# Patient Record
Sex: Female | Born: 1940 | Race: White | Hispanic: No | Smoking: Never smoker
Health system: Southern US, Community
[De-identification: ages and names within clinical notes are randomized; demographics above are authoritative.]

## PROBLEM LIST (undated history)

## (undated) DIAGNOSIS — I482 Chronic atrial fibrillation, unspecified: Secondary | ICD-10-CM

## (undated) DIAGNOSIS — E7800 Pure hypercholesterolemia, unspecified: Secondary | ICD-10-CM

## (undated) DIAGNOSIS — J189 Pneumonia, unspecified organism: Secondary | ICD-10-CM

## (undated) DIAGNOSIS — J42 Unspecified chronic bronchitis: Secondary | ICD-10-CM

## (undated) DIAGNOSIS — E785 Hyperlipidemia, unspecified: Secondary | ICD-10-CM

## (undated) DIAGNOSIS — E119 Type 2 diabetes mellitus without complications: Secondary | ICD-10-CM

## (undated) DIAGNOSIS — R000 Tachycardia, unspecified: Secondary | ICD-10-CM

## (undated) HISTORY — PX: BACK SURGERY: SHX140

## (undated) HISTORY — PX: ECTOPIC PREGNANCY SURGERY: SHX613

---

## 2016-06-13 ENCOUNTER — Telehealth: Payer: Self-pay | Admitting: Internal Medicine

## 2016-06-13 NOTE — Telephone Encounter (Signed)
Patients daughter Kym Groom) called and wanted to see if Dr. Alain Marion would take her Mother on as a patient. The patient speaks russian. She needs a doctor that can understand her. She has never been in the [facility]. She is in the hospital now in [place]. She always has a green card her daughter states and no insurance. They are aware she will have to pay $80 for her appointment up front. Please advise if you will take her on as a patient. Thank you.

## 2016-09-27 ENCOUNTER — Inpatient Hospital Stay (HOSPITAL_BASED_OUTPATIENT_CLINIC_OR_DEPARTMENT_OTHER)
Admission: EM | Admit: 2016-09-27 | Discharge: 2016-10-16 | DRG: 853 | Disposition: A | Discharge status: Home / Self Care | Payer: Medicaid Other | Attending: Internal Medicine | Admitting: Internal Medicine

## 2016-09-27 ENCOUNTER — Encounter (HOSPITAL_BASED_OUTPATIENT_CLINIC_OR_DEPARTMENT_OTHER): Payer: Self-pay

## 2016-09-27 ENCOUNTER — Emergency Department (HOSPITAL_BASED_OUTPATIENT_CLINIC_OR_DEPARTMENT_OTHER): Payer: Medicaid Other

## 2016-09-27 DIAGNOSIS — C3490 Malignant neoplasm of unspecified part of unspecified bronchus or lung: Secondary | ICD-10-CM | POA: Diagnosis present

## 2016-09-27 DIAGNOSIS — T461X5A Adverse effect of calcium-channel blockers, initial encounter: Secondary | ICD-10-CM | POA: Diagnosis not present

## 2016-09-27 DIAGNOSIS — J9601 Acute respiratory failure with hypoxia: Secondary | ICD-10-CM | POA: Diagnosis present

## 2016-09-27 DIAGNOSIS — I952 Hypotension due to drugs: Secondary | ICD-10-CM | POA: Diagnosis not present

## 2016-09-27 DIAGNOSIS — R042 Hemoptysis: Secondary | ICD-10-CM | POA: Diagnosis present

## 2016-09-27 DIAGNOSIS — R0602 Shortness of breath: Secondary | ICD-10-CM

## 2016-09-27 DIAGNOSIS — Q211 Atrial septal defect: Secondary | ICD-10-CM

## 2016-09-27 DIAGNOSIS — I2609 Other pulmonary embolism with acute cor pulmonale: Secondary | ICD-10-CM | POA: Diagnosis present

## 2016-09-27 DIAGNOSIS — J9602 Acute respiratory failure with hypercapnia: Secondary | ICD-10-CM | POA: Diagnosis not present

## 2016-09-27 DIAGNOSIS — R918 Other nonspecific abnormal finding of lung field: Secondary | ICD-10-CM

## 2016-09-27 DIAGNOSIS — R059 Cough, unspecified: Secondary | ICD-10-CM

## 2016-09-27 DIAGNOSIS — Z8249 Family history of ischemic heart disease and other diseases of the circulatory system: Secondary | ICD-10-CM | POA: Diagnosis not present

## 2016-09-27 DIAGNOSIS — T501X5A Adverse effect of loop [high-ceiling] diuretics, initial encounter: Secondary | ICD-10-CM | POA: Diagnosis not present

## 2016-09-27 DIAGNOSIS — K5900 Constipation, unspecified: Secondary | ICD-10-CM | POA: Diagnosis present

## 2016-09-27 DIAGNOSIS — E875 Hyperkalemia: Secondary | ICD-10-CM | POA: Diagnosis not present

## 2016-09-27 DIAGNOSIS — Z7901 Long term (current) use of anticoagulants: Secondary | ICD-10-CM

## 2016-09-27 DIAGNOSIS — I272 Pulmonary hypertension, unspecified: Secondary | ICD-10-CM | POA: Diagnosis present

## 2016-09-27 DIAGNOSIS — E785 Hyperlipidemia, unspecified: Secondary | ICD-10-CM | POA: Diagnosis present

## 2016-09-27 DIAGNOSIS — Z8701 Personal history of pneumonia (recurrent): Secondary | ICD-10-CM

## 2016-09-27 DIAGNOSIS — E8809 Other disorders of plasma-protein metabolism, not elsewhere classified: Secondary | ICD-10-CM | POA: Diagnosis present

## 2016-09-27 DIAGNOSIS — J90 Pleural effusion, not elsewhere classified: Secondary | ICD-10-CM | POA: Diagnosis present

## 2016-09-27 DIAGNOSIS — D649 Anemia, unspecified: Secondary | ICD-10-CM | POA: Diagnosis present

## 2016-09-27 DIAGNOSIS — R9431 Abnormal electrocardiogram [ECG] [EKG]: Secondary | ICD-10-CM | POA: Diagnosis present

## 2016-09-27 DIAGNOSIS — J181 Lobar pneumonia, unspecified organism: Secondary | ICD-10-CM | POA: Diagnosis present

## 2016-09-27 DIAGNOSIS — Z7951 Long term (current) use of inhaled steroids: Secondary | ICD-10-CM

## 2016-09-27 DIAGNOSIS — Z79899 Other long term (current) drug therapy: Secondary | ICD-10-CM

## 2016-09-27 DIAGNOSIS — J690 Pneumonitis due to inhalation of food and vomit: Secondary | ICD-10-CM

## 2016-09-27 DIAGNOSIS — I471 Supraventricular tachycardia: Secondary | ICD-10-CM | POA: Diagnosis present

## 2016-09-27 DIAGNOSIS — R590 Localized enlarged lymph nodes: Secondary | ICD-10-CM | POA: Diagnosis present

## 2016-09-27 DIAGNOSIS — J8410 Pulmonary fibrosis, unspecified: Secondary | ICD-10-CM | POA: Diagnosis present

## 2016-09-27 DIAGNOSIS — Z792 Long term (current) use of antibiotics: Secondary | ICD-10-CM

## 2016-09-27 DIAGNOSIS — E876 Hypokalemia: Secondary | ICD-10-CM | POA: Diagnosis present

## 2016-09-27 DIAGNOSIS — R000 Tachycardia, unspecified: Secondary | ICD-10-CM | POA: Diagnosis present

## 2016-09-27 DIAGNOSIS — I959 Hypotension, unspecified: Secondary | ICD-10-CM | POA: Diagnosis not present

## 2016-09-27 DIAGNOSIS — Z9889 Other specified postprocedural states: Secondary | ICD-10-CM

## 2016-09-27 DIAGNOSIS — J9811 Atelectasis: Secondary | ICD-10-CM | POA: Diagnosis present

## 2016-09-27 DIAGNOSIS — Y95 Nosocomial condition: Secondary | ICD-10-CM | POA: Diagnosis present

## 2016-09-27 DIAGNOSIS — J189 Pneumonia, unspecified organism: Secondary | ICD-10-CM | POA: Diagnosis present

## 2016-09-27 DIAGNOSIS — R1310 Dysphagia, unspecified: Secondary | ICD-10-CM | POA: Diagnosis present

## 2016-09-27 DIAGNOSIS — J9600 Acute respiratory failure, unspecified whether with hypoxia or hypercapnia: Secondary | ICD-10-CM

## 2016-09-27 DIAGNOSIS — R0600 Dyspnea, unspecified: Secondary | ICD-10-CM

## 2016-09-27 DIAGNOSIS — R05 Cough: Secondary | ICD-10-CM

## 2016-09-27 DIAGNOSIS — A419 Sepsis, unspecified organism: Secondary | ICD-10-CM | POA: Diagnosis present

## 2016-09-27 DIAGNOSIS — Z7982 Long term (current) use of aspirin: Secondary | ICD-10-CM

## 2016-09-27 HISTORY — DX: Pneumonia, unspecified organism: J18.9

## 2016-09-27 HISTORY — DX: Hyperlipidemia, unspecified: E78.5

## 2016-09-27 HISTORY — DX: Tachycardia, unspecified: R00.0

## 2016-09-27 LAB — CBC WITH DIFFERENTIAL/PLATELET
BASOS ABS: 0 10*3/uL (ref 0.0–0.1)
Basophils Relative: 0 %
Eosinophils Absolute: 0.2 10*3/uL (ref 0.0–0.7)
Eosinophils Relative: 2 %
HEMATOCRIT: 39.6 % (ref 36.0–46.0)
Hemoglobin: 13.3 g/dL (ref 12.0–15.0)
LYMPHS ABS: 3.2 10*3/uL (ref 0.7–4.0)
Lymphocytes Relative: 30 %
MCH: 27.9 pg (ref 26.0–34.0)
MCHC: 33.6 g/dL (ref 30.0–36.0)
MCV: 83 fL (ref 78.0–100.0)
MONO ABS: 1.1 10*3/uL — AB (ref 0.1–1.0)
Monocytes Relative: 10 %
NEUTROS ABS: 6 10*3/uL (ref 1.7–7.7)
Neutrophils Relative %: 58 %
Platelets: 327 10*3/uL (ref 150–400)
RBC: 4.77 MIL/uL (ref 3.87–5.11)
RDW: 14 % (ref 11.5–15.5)
WBC: 10.4 10*3/uL (ref 4.0–10.5)

## 2016-09-27 LAB — BASIC METABOLIC PANEL
ANION GAP: 7 (ref 5–15)
BUN: 23 mg/dL — AB (ref 6–20)
CO2: 29 mmol/L (ref 22–32)
Calcium: 8.8 mg/dL — ABNORMAL LOW (ref 8.9–10.3)
Chloride: 98 mmol/L — ABNORMAL LOW (ref 101–111)
Creatinine, Ser: 0.92 mg/dL (ref 0.44–1.00)
GFR calc Af Amer: >60 mL/min (ref >60)
GFR calc non Af Amer: 59 mL/min — ABNORMAL LOW (ref >60)
GLUCOSE: 129 mg/dL — AB (ref 65–99)
POTASSIUM: 3.9 mmol/L (ref 3.5–5.1)
Sodium: 134 mmol/L — ABNORMAL LOW (ref 135–145)

## 2016-09-27 LAB — TROPONIN I: Troponin I: <0.03 ng/mL (ref <0.03)

## 2016-09-27 LAB — BRAIN NATRIURETIC PEPTIDE: B NATRIURETIC PEPTIDE 5: 265.9 pg/mL — AB (ref 0.0–100.0)

## 2016-09-27 LAB — PROTIME-INR
INR: 0.92
PROTHROMBIN TIME: 12.4 s (ref 11.4–15.2)

## 2016-09-27 LAB — I-STAT CG4 LACTIC ACID, ED: Lactic Acid, Venous: 1.72 mmol/L (ref 0.5–1.9)

## 2016-09-27 MED ORDER — ALBUTEROL SULFATE (2.5 MG/3ML) 0.083% IN NEBU
5.0000 mg | INHALATION_SOLUTION | Freq: Once | RESPIRATORY_TRACT | Status: AC
Start: 2016-09-27 — End: 2016-09-27
  Administered 2016-09-27: 5 mg via RESPIRATORY_TRACT
  Filled 2016-09-27: qty 6

## 2016-09-27 MED ORDER — CEFEPIME HCL 1 G IJ SOLR
INTRAMUSCULAR | Status: AC
  Filled 2016-09-27: qty 1

## 2016-09-27 MED ORDER — SODIUM CHLORIDE 0.9 % IV BOLUS (SEPSIS)
1000.0000 mL | Freq: Once | INTRAVENOUS | Status: AC
  Administered 2016-09-27: 1000 mL via INTRAVENOUS

## 2016-09-27 MED ORDER — DEXTROSE 5 % IV SOLN
1.0000 g | Freq: Three times a day (TID) | INTRAVENOUS | Status: DC
  Administered 2016-09-27: 1 g via INTRAVENOUS

## 2016-09-27 MED ORDER — VANCOMYCIN HCL IN DEXTROSE 750-5 MG/150ML-% IV SOLN
750.0000 mg | INTRAVENOUS | Status: DC
  Administered 2016-09-28 – 2016-09-29 (×2): 750 mg via INTRAVENOUS
  Filled 2016-09-27 (×3): qty 150

## 2016-09-27 MED ORDER — VANCOMYCIN HCL IN DEXTROSE 1-5 GM/200ML-% IV SOLN
1000.0000 mg | Freq: Once | INTRAVENOUS | Status: AC
  Administered 2016-09-27: 1000 mg via INTRAVENOUS
  Filled 2016-09-27: qty 200

## 2016-09-27 MED ORDER — DEXTROSE 5 % IV SOLN
1.0000 g | INTRAVENOUS | Status: DC
Start: 2016-09-28 — End: 2016-10-01
  Administered 2016-09-28 – 2016-09-30 (×3): 1 g via INTRAVENOUS
  Filled 2016-09-27 (×3): qty 1

## 2016-09-27 MED ORDER — IOPAMIDOL (ISOVUE-300) INJECTION 61%
80.0000 mL | Freq: Once | INTRAVENOUS | Status: AC | PRN
  Administered 2016-09-27: 100 mL via INTRAVENOUS

## 2016-09-27 NOTE — ED Provider Notes (Signed)
[facility] DEPT Provider Note   CSN: [id number] Arrival date & time: 09/27/16  1830  By signing my name below, I, Reola Mosher, attest that this documentation has been prepared under the direction and in the presence of Coni Homesley, Forbes Cellar, MD. Electronically Signed: Reola Mosher, ED Scribe. 09/27/16. 7:33 PM.  History   Chief Complaint Chief Complaint  Patient presents with  . Cough   The history is provided by the patient and a relative. No language interpreter was used.   HPI Comments: Michelle Mccall is a 76 y.o. female who presents to the Emergency Department complaining of persistent, waxing and waning productive cough of white sputum beginning approximately one month ago. Per pt's family member, pt was seen at [facility] with similar cough in November and December and dx'd w/ PNA at that time; this resolved following antibiotics. She had a recurrence of this in April and was admitted to [facility] in [place] for 17 days and for hyponatremia. Last month, she travelled back to [place] where she is from and had another recurrence of her symptoms and she was admitted from July 9th-July 21st, per daughter. She was released from the hospital and subsequently travelled back to the [place] following this and her daughter reports that her cough has been persistent since, acutely worsening over the past several days. Daughter notes that she was prescribed several medications in [place] for this which she has been taking, but these have not been improving her symptoms recently. Her daughter states that she has also been short of breath, having some congestion/rhinorrhea, and experiencing palpitations intermittently as well. Her symptoms are typically worse at night. Pt is a non-smoker. Family denies any hemoptysis, fever, or any other associated symptoms. There are no other associated systemic symptoms, there are no other alleviating or modifying factors.   Past Medical History:  Diagnosis Date  .  Hyperlipidemia   . Pneumonia   . Tachyarrhythmia    Patient Active Problem List   Diagnosis Date Noted  . Hypokalemia 09/28/2016  . Anemia 09/28/2016  . Hypoalbuminemia 09/28/2016  . Tachyarrhythmia 09/28/2016  . Hyperlipidemia 09/28/2016  . Acute respiratory failure ([address]) 09/28/2016  . Abnormal EKG 09/28/2016  . HCAP (healthcare-associated pneumonia) 09/27/2016  . Pneumonia 09/27/2016  . Sepsis ([place]) 09/27/2016   Past Surgical History:  Procedure Laterality Date  . BACK SURGERY     OB History    No data available     Home Medications    Prior to Admission medications   Medication Sig Start Date End Date Taking? Authorizing Provider  albuterol (PROVENTIL) (2.5 MG/3ML) 0.083% nebulizer solution Take 2.5 mg by nebulization every 6 (six) hours as needed for wheezing or shortness of breath.   Yes [provider]  amoxicillin (AMOXIL) 500 MG capsule Take 500 mg by mouth 3 (three) times daily.   Yes [provider]  aspirin 81 MG chewable tablet Chew by mouth daily.   Yes [provider]  budesonide-formoterol (SYMBICORT) 160-4.5 MCG/ACT inhaler Inhale 2 puffs into the lungs 2 (two) times daily.   Yes [provider]  diltiazem (DILACOR XR) 180 MG 24 hr capsule Take 180 mg by mouth daily.   Yes [provider]  furosemide (LASIX) 40 MG tablet Take 40 mg by mouth.   Yes [provider]  levofloxacin (LEVAQUIN) 500 MG tablet Take 500 mg by mouth daily.   Yes [provider]  oseltamivir (TAMIFLU) 30 MG capsule Take 30 mg by mouth.   Yes [provider]  ranitidine (ZANTAC) 150 MG tablet Take 150 mg by mouth 2 (two) times daily.   Yes [provider]  warfarin (COUMADIN) 5 MG tablet Take 5 mg by mouth daily.   Yes [provider]   Family History Family History  Problem Relation Age of Onset  . Hypertension Other     Social History Social History  Substance Use Topics  . Smoking status:  Never Smoker  . Smokeless tobacco: Never Used  . Alcohol use No   Allergies   Patient has no known allergies.  Review of Systems Review of Systems  Constitutional: Negative for fever.  Respiratory: Positive for cough and shortness of breath.   All other systems reviewed and are negative.  Physical Exam Updated Vital Signs BP 95/72   Pulse (!) 127   Temp 97.7 F (36.5 C) (Oral)   Resp 20   Ht 5\' 1"  (1.549 m)   Wt 53.6 kg (118 lb 2.7 oz)   SpO2 98%   BMI 22.33 kg/m  Vitals reviewed Physical Exam  Physical Examination: General appearance - alert, ill appearing, and in no distress Mental status - alert, oriented to person, place, and time Eyes - no conjunctival injection, no scleral icterus Mouth - mucous membranes moist, pharynx normal without lesions Neck - supple, no significant adenopathy Chest - tachypneic, decreased breath sounds on left, crackles present, faint expiratory wheeze Heart - normal rate, regular rhythm, normal S1, S2, no murmurs, rubs, clicks or gallops Abdomen - soft, nontender, nondistended, no masses or organomegaly Neurological - alert, oriented, normal speech, moving all extremities Extremities - peripheral pulses normal, no pedal edema, no clubbing or cyanosis Skin - normal coloration and turgor, no rashes  ED Treatments / Results  DIAGNOSTIC STUDIES: Oxygen Saturation is 96% on 2L via [place], normal by my interpretation.   COORDINATION OF CARE: 7:32 PM-Discussed next steps with pt. Pt verbalized understanding and is agreeable with the plan.   Labs (all labs ordered are listed, but only abnormal results are displayed) Labs Reviewed  CBC WITH DIFFERENTIAL/PLATELET - Abnormal; Notable for the following:       Result Value   Monocytes Absolute 1.1 (*)    All other components within normal limits  BASIC METABOLIC PANEL - Abnormal; Notable for the following:    Sodium 134 (*)    Chloride 98 (*)    Glucose, Bld 129 (*)    BUN 23 (*)    Calcium  8.8 (*)    GFR calc non Af Amer 59 (*)    All other components within normal limits  BRAIN NATRIURETIC PEPTIDE - Abnormal; Notable for the following:    B Natriuretic Peptide 265.9 (*)    All other components within normal limits  CBC WITH DIFFERENTIAL/PLATELET - Abnormal; Notable for the following:    HCT 35.9 (*)    All other components within normal limits  COMPREHENSIVE METABOLIC PANEL - Abnormal; Notable for the following:    Glucose, Bld 114 (*)    Calcium 8.6 (*)    Total Protein 6.3 (*)    Albumin 2.8 (*)    AST 14 (*)    ALT 9 (*)    All other components within normal limits  CBC WITH DIFFERENTIAL/PLATELET - Abnormal; Notable for the following:    Hemoglobin 11.0 (*)    HCT 33.4 (*)    All other components within normal limits  COMPREHENSIVE METABOLIC PANEL - Abnormal; Notable for the following:    Potassium 3.4 (*)  Glucose, Bld 113 (*)    Calcium 8.2 (*)    Total Protein 5.8 (*)    Albumin 2.5 (*)    AST 14 (*)    ALT 9 (*)    All other components within normal limits  BASIC METABOLIC PANEL - Abnormal; Notable for the following:    Potassium 3.4 (*)    Glucose, Bld 114 (*)    Calcium 8.3 (*)    All other components within normal limits  CULTURE, BLOOD (ROUTINE X 2)  CULTURE, BLOOD (ROUTINE X 2)  MRSA PCR SCREENING  CULTURE, EXPECTORATED SPUTUM-ASSESSMENT  CULTURE, RESPIRATORY (NON-EXPECTORATED)  TROPONIN I  PROTIME-INR  LACTIC ACID, PLASMA  LACTIC ACID, PLASMA  PROCALCITONIN  HIV ANTIBODY (ROUTINE TESTING)  STREP PNEUMONIAE URINARY ANTIGEN  MAGNESIUM  PHOSPHORUS  TROPONIN I  LEGIONELLA PNEUMOPHILA SEROGP 1 UR AG  BLOOD GAS, ARTERIAL  I-STAT CG4 LACTIC ACID, ED   EKG  EKG Interpretation  Date/Time:  Wednesday September 27 2016 19:05:38 EDT Ventricular Rate:  122 PR Interval:    QRS Duration: 85 QT Interval:  312 QTC Calculation: 445 R Axis:   68 Text Interpretation:  Sinus tachycardia Abnormal R-wave progression, late transition Nonspecific T  abnormalities, anterior leads No old tracing to compare Confirmed by Alfonzo Beers [phone]) on 09/27/2016 7:31:49 PM Also confirmed by Alfonzo Beers [phone]), editor Philomena Doheny [phone])  on 09/28/2016 7:24:03 AM      Radiology Dg Chest 2 View  Result Date: 09/27/2016 CLINICAL DATA:  Productive cough. EXAM: CHEST  2 VIEW COMPARISON:  None. FINDINGS: Normal cardiac silhouette. There is diffuse airspace disease involving the LEFT lower lobe and lingula. RIGHT lung history of airspace disease. There is a nodule in the RIGHT lung which appears calcified measuring 6 mm. Aggressive osseous lesion IMPRESSION: 1. Extensive LEFT lower lobe and lingular pneumonia. Consider contrast chest CT to exclude central obstructing lesion. 2. Calcified granuloma in the RIGHT lung. Electronically Signed   By: Suzy Bouchard M.D.   On: 09/27/2016 20:40   Ct Chest W Contrast  Result Date: 09/27/2016 CLINICAL DATA:  Pneumonia recurrent cough, shortness of breath EXAM: CT CHEST WITH CONTRAST TECHNIQUE: Multidetector CT imaging of the chest was performed during intravenous contrast administration. CONTRAST:  123mL ISOVUE-300 IOPAMIDOL (ISOVUE-300) INJECTION 61% COMPARISON:  09/27/2016 FINDINGS: Cardiovascular: Non aneurysmal aorta. Minimal atherosclerotic calcifications. Normal heart size. No pericardial effusion. Mediastinum/Nodes: The midline trachea. No thyroid mass. Mild mediastinal adenopathy, largest lymph node is seen in the AP window and measures 1 cm. Esophagus within normal limits Lungs/Pleura: Tiny left pleural effusion. Extensive consolidations and ground-glass densities in the left lower and upper lobes with peripheral consolidations. Small amount of ground-glass density and consolidation in the right middle lobe. Subpleural nodularity in the right lower lobe. 12 mm right upper lobe pulmonary nodule. Upper Abdomen: No acute abnormality. Musculoskeletal: No chest wall abnormality. No acute or significant osseous  findings. IMPRESSION: 1. Extensive consolidations and ground-glass density throughout the left upper and lower lobes, most consistent with pneumonia. Additional infiltrate is present within the right middle lobe. Mild subpleural nodularity in the right lower lobe is suspicious for additional small foci of infection. Small left pleural effusion. Imaging follow-up to resolution is recommended 2. Mild mediastinal adenopathy, likely reactive Aortic Atherosclerosis (ICD10-I70.0). Electronically Signed   By: Donavan Foil M.D.   On: 09/27/2016 22:36    Procedures Procedures   Medications Ordered in ED Medications  ceFEPIme (MAXIPIME) 1 g injection (not administered)  vancomycin (VANCOCIN) IVPB 750 mg/150 ml premix (  not administered)  ceFEPIme (MAXIPIME) 1 g in dextrose 5 % 50 mL IVPB (not administered)  ipratropium-albuterol (DUONEB) 0.5-2.5 (3) MG/3ML nebulizer solution 3 mL (3 mLs Nebulization Given 09/28/16 1400)  ondansetron (ZOFRAN) injection 4 mg (not administered)  famotidine (PEPCID) tablet 20 mg (20 mg Oral Given 09/28/16 0926)  mometasone-formoterol (DULERA) 200-5 MCG/ACT inhaler 2 puff (2 puffs Inhalation Given 09/28/16 0900)  aspirin chewable tablet 81 mg (81 mg Oral Given 09/28/16 0926)  heparin injection 5,000 Units (5,000 Units Subcutaneous Given 09/28/16 1542)  0.9 % NaCl with KCl 20 mEq/ L  infusion ( Intravenous Rate/Dose Verify 09/28/16 1300)  MEDLINE mouth rinse (15 mLs Mouth Rinse Given 09/28/16 1000)  warfarin (COUMADIN) tablet 7.5 mg (not administered)  Warfarin - Pharmacist Dosing Inpatient (not administered)  albuterol (PROVENTIL) (2.5 MG/3ML) 0.083% nebulizer solution 5 mg (5 mg Nebulization Given 09/27/16 1934)  vancomycin (VANCOCIN) IVPB 1000 mg/200 mL premix (0 mg Intravenous Stopped 09/27/16 2245)  sodium chloride 0.9 % bolus 1,000 mL (0 mLs Intravenous Stopped 09/27/16 2344)  iopamidol (ISOVUE-300) 61 % injection 80 mL (100 mLs Intravenous Contrast Given 09/27/16 2211)    magnesium sulfate IVPB 2 g 50 mL (0 g Intravenous Stopped 09/28/16 0517)  methylPREDNISolone sodium succinate (SOLU-MEDROL) 125 mg/2 mL injection 125 mg (125 mg Intravenous Given 09/28/16 0339)  sodium chloride 0.9 % bolus 500 mL (500 mLs Intravenous New Bag/Given 09/28/16 0315)  sodium chloride 0.9 % bolus 500 mL (0 mLs Intravenous Stopped 09/28/16 0427)  potassium chloride SA (K-DUR,KLOR-CON) CR tablet 20 mEq (20 mEq Oral Given 09/28/16 0339)   CRITICAL CARE Performed by: Marcha Dutton, Gilberto Stanforth L Total critical care time: 45 minutes Critical care time was exclusive of separately billable procedures and treating other patients. Critical care was necessary to treat or prevent imminent or life-threatening deterioration. Critical care was time spent personally by me on the following activities: development of treatment plan with patient and/or surrogate as well as nursing, discussions with consultants, evaluation of patient's response to treatment, examination of patient, obtaining history from patient or surrogate, ordering and performing treatments and interventions, ordering and review of laboratory studies, ordering and review of radiographic studies, pulse oximetry and re-evaluation of patient's condition.  Initial Impression / Assessment and Plan / ED Course  I have reviewed the triage vital signs and the nursing notes.  Pertinent labs & imaging results that were available during my care of the patient were reviewed by me and considered in my medical decision making (see chart for details).    9:54 PM d /w Dr. Roel Cluck, triad hospitalist [place] for admission.  She will request step down bed for now, will give IV fluids to see if this improves her blood pressure.  Chest CT with contrast ordered which will be obtained prior to transport.    Pt presenting with c/o cough, shortness of breath- over the past several months has had mutliple bouts of pneumonia- unclear if these have cleared in between.   Daughter states she is worsening over the past several days.  Workup reveals large left sided infiltrate- no obstruction on CT. She is tachypneic- HR elevated initially- sinus tach on EKG- then down to 80s for majority of ED stay, lactate normal, blood cultures obtained.  Prior to transport HR up 120s again.  Blood pressure maintaining in the 57D systolic after IV fluids bolus.  Pt admitted to stepdown.    Final Clinical Impressions(s) / ED Diagnoses   Final diagnoses:  HCAP (healthcare-associated pneumonia)   New Prescriptions Current  Discharge Medication List     I personally performed the services described in this documentation, which was scribed in my presence. The recorded information has been reviewed and is accurate.      Pixie Casino, MD 09/28/16 [phone]

## 2016-09-27 NOTE — ED Notes (Signed)
Report given to [facility]

## 2016-09-27 NOTE — Progress Notes (Signed)
Pharmacy Antibiotic Note  Michelle Mccall is a 76 y.o. female admitted on 09/27/2016 with pneumonia.  Pharmacy has been consulted for vancomycin dosing. Cr 0.92 and CrCl ~ 45 ml/min.  Vancomycin trough goal 15-20  Plan: 1) Vancomycin 1g IV x 1 then 750mg  IV q24 2) Follow renal function, cultures, LOT, level if needed 3) Adjust cefepime to 1g IV q24  Weight: 119 lb 7.8 oz (54.2 kg)  Temp (24hrs), Avg:99.3 F (37.4 C), Min:99.3 F (37.4 C), Max:99.3 F (37.4 C)   Recent Labs Lab 09/27/16 1900 09/27/16 1911  WBC 10.4  --   CREATININE 0.92  --   LATICACIDVEN  --  1.72    CrCl cannot be calculated (Unknown ideal weight.).    No Known Allergies  Antimicrobials this admission: 8/15 Vancomycin >> 8/15 Cefepime >>  Dose adjustments this admission: n/a  Microbiology results: 8/15 blood cx >>  Thank you for allowing pharmacy to be a part of this patient's care.  Deboraha Sprang 09/27/2016 8:59 PM

## 2016-09-27 NOTE — ED Notes (Signed)
Report called to [facility] ICU

## 2016-09-27 NOTE — ED Triage Notes (Addendum)
Per daughter due to pt does not speak english-pt came from [place] 8/1-had been in [place] there being treated for PNE-presents to triage with labored breathing-ambulating with own cane

## 2016-09-27 NOTE — ED Notes (Signed)
Patient transported to CT 

## 2016-09-27 NOTE — Plan of Care (Signed)
76 year old Russian-speaking female with recurrent/undertreated pneumonia since June with recurrent cough recent admission in  [place] for possible pneumonia treatment presents to emergency department for father evaluation and treatment of ongoing pneumonia. Reports in June she was seen at [facility]) and was treated for pneumonia then.  RR 29 Systolic blood pressures in the 90s ordered some fluid blood cultures ordered lactic acid within normal limits  Requested CT of the chest to further evaluate and will accept a stepdown bed  Alixandrea Milleson 9:55 PM

## 2016-09-27 NOTE — ED Notes (Signed)
amb to BR w/o difficulty 

## 2016-09-28 ENCOUNTER — Inpatient Hospital Stay (HOSPITAL_COMMUNITY): Payer: Medicaid Other

## 2016-09-28 ENCOUNTER — Encounter (HOSPITAL_COMMUNITY): Payer: Self-pay | Admitting: Internal Medicine

## 2016-09-28 DIAGNOSIS — E876 Hypokalemia: Secondary | ICD-10-CM | POA: Diagnosis present

## 2016-09-28 DIAGNOSIS — R000 Tachycardia, unspecified: Secondary | ICD-10-CM | POA: Diagnosis present

## 2016-09-28 DIAGNOSIS — I361 Nonrheumatic tricuspid (valve) insufficiency: Secondary | ICD-10-CM

## 2016-09-28 DIAGNOSIS — E8809 Other disorders of plasma-protein metabolism, not elsewhere classified: Secondary | ICD-10-CM | POA: Diagnosis present

## 2016-09-28 DIAGNOSIS — J9601 Acute respiratory failure with hypoxia: Secondary | ICD-10-CM | POA: Diagnosis present

## 2016-09-28 DIAGNOSIS — R9431 Abnormal electrocardiogram [ECG] [EKG]: Secondary | ICD-10-CM | POA: Diagnosis present

## 2016-09-28 DIAGNOSIS — E785 Hyperlipidemia, unspecified: Secondary | ICD-10-CM | POA: Diagnosis present

## 2016-09-28 DIAGNOSIS — J9602 Acute respiratory failure with hypercapnia: Secondary | ICD-10-CM

## 2016-09-28 DIAGNOSIS — D649 Anemia, unspecified: Secondary | ICD-10-CM | POA: Diagnosis present

## 2016-09-28 LAB — EXPECTORATED SPUTUM ASSESSMENT W GRAM STAIN, RFLX TO RESP C

## 2016-09-28 LAB — COMPREHENSIVE METABOLIC PANEL
ALBUMIN: 2.5 g/dL — AB (ref 3.5–5.0)
ALK PHOS: 52 U/L (ref 38–126)
ALT: 9 U/L — ABNORMAL LOW (ref 14–54)
ALT: 9 U/L — ABNORMAL LOW (ref 14–54)
AST: 14 U/L — ABNORMAL LOW (ref 15–41)
AST: 14 U/L — AB (ref 15–41)
Albumin: 2.8 g/dL — ABNORMAL LOW (ref 3.5–5.0)
Alkaline Phosphatase: 56 U/L (ref 38–126)
Anion gap: 6 (ref 5–15)
Anion gap: 5 (ref 5–15)
BILIRUBIN TOTAL: 0.6 mg/dL (ref 0.3–1.2)
BILIRUBIN TOTAL: 0.4 mg/dL (ref 0.3–1.2)
BUN: 18 mg/dL (ref 6–20)
BUN: 17 mg/dL (ref 6–20)
CALCIUM: 8.2 mg/dL — AB (ref 8.9–10.3)
CHLORIDE: 102 mmol/L (ref 101–111)
CO2: 28 mmol/L (ref 22–32)
CO2: 28 mmol/L (ref 22–32)
CREATININE: 0.76 mg/dL (ref 0.44–1.00)
CREATININE: 0.61 mg/dL (ref 0.44–1.00)
Calcium: 8.6 mg/dL — ABNORMAL LOW (ref 8.9–10.3)
Chloride: 104 mmol/L (ref 101–111)
GFR calc Af Amer: >60 mL/min (ref >60)
GLUCOSE: 113 mg/dL — AB (ref 65–99)
Glucose, Bld: 114 mg/dL — ABNORMAL HIGH (ref 65–99)
POTASSIUM: 3.5 mmol/L (ref 3.5–5.1)
Potassium: 3.4 mmol/L — ABNORMAL LOW (ref 3.5–5.1)
Sodium: 136 mmol/L (ref 135–145)
Sodium: 137 mmol/L (ref 135–145)
TOTAL PROTEIN: 6.3 g/dL — AB (ref 6.5–8.1)
TOTAL PROTEIN: 5.8 g/dL — AB (ref 6.5–8.1)

## 2016-09-28 LAB — CBC WITH DIFFERENTIAL/PLATELET
BASOS ABS: 0 10*3/uL (ref 0.0–0.1)
BASOS PCT: 0 %
Basophils Absolute: 0 10*3/uL (ref 0.0–0.1)
Basophils Relative: 0 %
EOS PCT: 2 %
Eosinophils Absolute: 0.2 10*3/uL (ref 0.0–0.7)
Eosinophils Absolute: 0.2 10*3/uL (ref 0.0–0.7)
Eosinophils Relative: 3 %
HCT: 35.9 % — ABNORMAL LOW (ref 36.0–46.0)
HEMATOCRIT: 33.4 % — AB (ref 36.0–46.0)
HEMOGLOBIN: 11 g/dL — AB (ref 12.0–15.0)
Hemoglobin: 12 g/dL (ref 12.0–15.0)
LYMPHS ABS: 2.9 10*3/uL (ref 0.7–4.0)
LYMPHS PCT: 29 %
LYMPHS PCT: 30 %
Lymphs Abs: 2.7 10*3/uL (ref 0.7–4.0)
MCH: 28 pg (ref 26.0–34.0)
MCH: 27.6 pg (ref 26.0–34.0)
MCHC: 33.4 g/dL (ref 30.0–36.0)
MCHC: 32.9 g/dL (ref 30.0–36.0)
MCV: 83.9 fL (ref 78.0–100.0)
MCV: 83.9 fL (ref 78.0–100.0)
MONO ABS: 1 10*3/uL (ref 0.1–1.0)
MONOS PCT: 10 %
MONOS PCT: 11 %
Monocytes Absolute: 0.9 10*3/uL (ref 0.1–1.0)
NEUTROS ABS: 5 10*3/uL (ref 1.7–7.7)
NEUTROS PCT: 56 %
Neutro Abs: 6 10*3/uL (ref 1.7–7.7)
Neutrophils Relative %: 59 %
PLATELETS: 284 10*3/uL (ref 150–400)
Platelets: 248 10*3/uL (ref 150–400)
RBC: 4.28 MIL/uL (ref 3.87–5.11)
RBC: 3.98 MIL/uL (ref 3.87–5.11)
RDW: 14 % (ref 11.5–15.5)
RDW: 14 % (ref 11.5–15.5)
WBC: 10.1 10*3/uL (ref 4.0–10.5)
WBC: 8.9 10*3/uL (ref 4.0–10.5)

## 2016-09-28 LAB — BASIC METABOLIC PANEL
ANION GAP: 6 (ref 5–15)
BUN: 15 mg/dL (ref 6–20)
CALCIUM: 8.3 mg/dL — AB (ref 8.9–10.3)
CO2: 27 mmol/L (ref 22–32)
CREATININE: 0.56 mg/dL (ref 0.44–1.00)
Chloride: 104 mmol/L (ref 101–111)
GFR calc non Af Amer: >60 mL/min (ref >60)
Glucose, Bld: 114 mg/dL — ABNORMAL HIGH (ref 65–99)
POTASSIUM: 3.4 mmol/L — AB (ref 3.5–5.1)
Sodium: 137 mmol/L (ref 135–145)

## 2016-09-28 LAB — MRSA PCR SCREENING: MRSA by PCR: NEGATIVE

## 2016-09-28 LAB — HIV ANTIBODY (ROUTINE TESTING W REFLEX): HIV SCREEN 4TH GENERATION: NONREACTIVE

## 2016-09-28 LAB — ECHOCARDIOGRAM COMPLETE
Height: 61 in
Weight: 1890.66 oz

## 2016-09-28 LAB — PHOSPHORUS: Phosphorus: 3.1 mg/dL (ref 2.5–4.6)

## 2016-09-28 LAB — EXPECTORATED SPUTUM ASSESSMENT W REFEX TO RESP CULTURE: SPECIAL REQUESTS: NORMAL

## 2016-09-28 LAB — LACTIC ACID, PLASMA
LACTIC ACID, VENOUS: 1.3 mmol/L (ref 0.5–1.9)
LACTIC ACID, VENOUS: 1 mmol/L (ref 0.5–1.9)

## 2016-09-28 LAB — MAGNESIUM: Magnesium: 1.7 mg/dL (ref 1.7–2.4)

## 2016-09-28 LAB — STREP PNEUMONIAE URINARY ANTIGEN: Strep Pneumo Urinary Antigen: NEGATIVE

## 2016-09-28 LAB — TROPONIN I

## 2016-09-28 LAB — PROCALCITONIN: Procalcitonin: <0.1 ng/mL

## 2016-09-28 MED ORDER — HEPARIN SODIUM (PORCINE) 5000 UNIT/ML IJ SOLN
5000.0000 [IU] | Freq: Three times a day (TID) | INTRAMUSCULAR | Status: DC
  Administered 2016-09-28 (×3): 5000 [IU] via SUBCUTANEOUS
  Filled 2016-09-28 (×3): qty 1

## 2016-09-28 MED ORDER — WARFARIN SODIUM 5 MG PO TABS
7.5000 mg | ORAL_TABLET | Freq: Once | ORAL | Status: AC
  Administered 2016-09-28: 18:00:00 7.5 mg via ORAL
  Filled 2016-09-28: qty 1

## 2016-09-28 MED ORDER — IPRATROPIUM-ALBUTEROL 0.5-2.5 (3) MG/3ML IN SOLN
3.0000 mL | Freq: Four times a day (QID) | RESPIRATORY_TRACT | Status: DC
  Administered 2016-09-28 – 2016-09-30 (×9): 3 mL via RESPIRATORY_TRACT
  Filled 2016-09-28 (×9): qty 3

## 2016-09-28 MED ORDER — POTASSIUM CHLORIDE CRYS ER 20 MEQ PO TBCR
20.0000 meq | EXTENDED_RELEASE_TABLET | Freq: Once | ORAL | Status: AC
  Administered 2016-09-28: 20 meq via ORAL
  Filled 2016-09-28: qty 1

## 2016-09-28 MED ORDER — ONDANSETRON HCL 4 MG/2ML IJ SOLN
4.0000 mg | Freq: Four times a day (QID) | INTRAMUSCULAR | Status: DC | PRN
  Administered 2016-10-02: 4 mg via INTRAVENOUS
  Filled 2016-09-28: qty 2

## 2016-09-28 MED ORDER — ASPIRIN 81 MG PO CHEW
81.0000 mg | CHEWABLE_TABLET | Freq: Every day | ORAL | Status: DC
  Administered 2016-09-28 – 2016-10-16 (×18): 81 mg via ORAL
  Filled 2016-09-28 (×19): qty 1

## 2016-09-28 MED ORDER — METHYLPREDNISOLONE SODIUM SUCC 125 MG IJ SOLR
125.0000 mg | INTRAMUSCULAR | Status: AC
  Administered 2016-09-28: 125 mg via INTRAVENOUS
  Filled 2016-09-28: qty 2

## 2016-09-28 MED ORDER — SODIUM CHLORIDE 0.9 % IV SOLN
INTRAVENOUS | Status: DC
  Administered 2016-09-28: 02:00:00 via INTRAVENOUS

## 2016-09-28 MED ORDER — POTASSIUM CHLORIDE IN NACL 20-0.9 MEQ/L-% IV SOLN
INTRAVENOUS | Status: DC
  Administered 2016-09-28 – 2016-10-01 (×6): via INTRAVENOUS
  Filled 2016-09-28 (×6): qty 1000

## 2016-09-28 MED ORDER — FAMOTIDINE 20 MG PO TABS
20.0000 mg | ORAL_TABLET | Freq: Two times a day (BID) | ORAL | Status: DC
  Administered 2016-09-28 – 2016-10-16 (×38): 20 mg via ORAL
  Filled 2016-09-28 (×39): qty 1

## 2016-09-28 MED ORDER — SODIUM CHLORIDE 0.9 % IV BOLUS (SEPSIS)
500.0000 mL | Freq: Once | INTRAVENOUS | Status: AC
  Administered 2016-09-28: 500 mL via INTRAVENOUS

## 2016-09-28 MED ORDER — DILTIAZEM HCL ER COATED BEADS 180 MG PO CP24: 180.0000 mg | ORAL_CAPSULE | Freq: Every day | ORAL | Status: DC

## 2016-09-28 MED ORDER — MAGNESIUM SULFATE 2 GM/50ML IV SOLN
2.0000 g | Freq: Once | INTRAVENOUS | Status: AC
Start: 2016-09-28 — End: 2016-09-28
  Administered 2016-09-28: 2 g via INTRAVENOUS
  Filled 2016-09-28: qty 50

## 2016-09-28 MED ORDER — WARFARIN - PHARMACIST DOSING INPATIENT
Freq: Every day | Status: DC
  Administered 2016-09-28: 18:00:00

## 2016-09-28 MED ORDER — ORAL CARE MOUTH RINSE
15.0000 mL | Freq: Two times a day (BID) | OROMUCOSAL | Status: DC
  Administered 2016-09-28 – 2016-10-15 (×26): 15 mL via OROMUCOSAL

## 2016-09-28 MED ORDER — MOMETASONE FURO-FORMOTEROL FUM 200-5 MCG/ACT IN AERO
2.0000 | INHALATION_SPRAY | Freq: Two times a day (BID) | RESPIRATORY_TRACT | Status: DC
  Administered 2016-09-28 – 2016-09-29 (×4): 2 via RESPIRATORY_TRACT
  Filled 2016-09-28: qty 8.8

## 2016-09-28 NOTE — H&P (Signed)
History and Physical    Michelle Mccall DEY:[id number] DOB: 1940/10/31 DOA: 09/27/2016  PCP: No primary care provider on file.   Patient coming from: [facility].  I have personally briefly reviewed patient's old medical records in [place]  Chief Complaint: Cough and shortness of breath.  HPI: Michelle Mccall is a 76 y.o. [place] female with medical history significant of hyperlipidemia, unspecified tachyarrhythmia, multiple episodes of pneumonia in the past 9 months (November/December 2017, April 2018 10 last month while in [place]) who is transferred from the emergency department at [facility] after presenting there with progressively worse dyspnea since this morning. Apparently no fever, chills, hemoptysis or wheezing.  Last year, she was admitted at [facility] with similar symptoms at the end of November and beginning of December and was treated for pneumonia. In April, she had another recurrence and was admitted at [facility], in [place], [place] for 17 days for pneumonia and hyponatremia. Last month, she traveled back to [place] and was admitted for pneumonia as well. She returned back to [place] on 09/13/2016 and brought with her several medications from [place], but per her daughter these are not improving her symptoms. She has been coughing yellowish sputum, having dyspnea, nasal congestion and rhinorrhea. Her daughter also states that she has been experiencing palpitations, but the patient denies chest pain, dizziness or diaphoresis. She also complains of nausea, but no emesis, abdominal pain, diarrhea or constipation. No dysuria, frequency or hematuria.  ED Course: Her initial vital signs temperature 99.35F, pulse 128, blood pressure 104/71 mmHg and O2 sat 94% on room air. She received normal saline 1 L bolus, cefepime and vancomycin.  Her workup showed WBC was 10.4, hemoglobin 13.3 g/dL and platelets 327. PT and INR were normal. Her lactic acid was 1.72, sodium 134, potassium 3.9 (follow-up  level 3.4), chloride 98, bicarbonate 29 mmol/L. BUN was 23, creatinine 0.92 and glucose 129 mg/dL. Her troponin level was normal, however her BNP was 265.9 pg/mL. EKG was sinus tachycardia at 122 BPM, normal R-wave progression and nonspecific ST changes on anterior leads. There was no previous EKG to compare with.  Imaging: Her chest radiograph shows extensive left lower lobe and lingular pneumonia and calcified right lung granuloma. This was followed by CT chest with contrast which show extensive consolidations of the left lower and upper lobes. There was also an infiltrate on the right middle lobe with a mild mediastinal adenopathy. Please see images and full radiology report for further detail.  Interval clinical course: After arrival to the ICU/stepdown unit, the patient became tachycardia with a pulse of 127, hypotensive with a blood pressure of 79/50 mmHg despite earlier 500 normal saline bolus, tachypneic with respiratory rate of 26 and hypoxic at 88% despite having nasal cannula oxygen at 10 LPM. She has improved with DuoNeb, a second 500 mL of normal saline bolus, Solu-Medrol 125 mg IVP 1 dose. BiPAP ventilation was ordered as needed, but still not required. Pulmonary/critical care medicine was about point consulted.  Review of Systems: As per HPI otherwise 10 point review of systems negative.    Past Medical History:  Diagnosis Date  . Hyperlipidemia   . Pneumonia   . Tachyarrhythmia     Past Surgical History:  Procedure Laterality Date  . BACK SURGERY       reports that she has never smoked. She has never used smokeless tobacco. She reports that she does not drink alcohol or use drugs.  No Known Allergies  Family History  Problem Relation Age  of Onset  . Hypertension Other     Prior to Admission medications   Medication Sig Start Date End Date Taking? Authorizing Provider  albuterol (PROVENTIL) (2.5 MG/3ML) 0.083% nebulizer solution Take 2.5 mg by nebulization every 6  (six) hours as needed for wheezing or shortness of breath.   Yes [provider]  amoxicillin (AMOXIL) 500 MG capsule Take 500 mg by mouth 3 (three) times daily.   Yes [provider]  aspirin 81 MG chewable tablet Chew by mouth daily.   Yes [provider]  budesonide-formoterol (SYMBICORT) 160-4.5 MCG/ACT inhaler Inhale 2 puffs into the lungs 2 (two) times daily.   Yes [provider]  diltiazem (DILACOR XR) 180 MG 24 hr capsule Take 180 mg by mouth daily.   Yes [provider]  furosemide (LASIX) 40 MG tablet Take 40 mg by mouth.   Yes [provider]  levofloxacin (LEVAQUIN) 500 MG tablet Take 500 mg by mouth daily.   Yes [provider]  oseltamivir (TAMIFLU) 30 MG capsule Take 30 mg by mouth.   Yes [provider]  ranitidine (ZANTAC) 150 MG tablet Take 150 mg by mouth 2 (two) times daily.   Yes [provider]  warfarin (COUMADIN) 5 MG tablet Take 5 mg by mouth daily.   Yes [provider]    Physical Exam: Vitals:   09/28/16 0100 09/28/16 0200 09/28/16 0300 09/28/16 0345  BP: 92/66 (!) 88/53 (!) 79/50   Pulse:      Resp: (!) 26 18 (!) 26   Temp:   97.7 F (36.5 C)   TempSrc:   Oral   SpO2: 95% 96% (!) 86% 96%  Weight:      Height:        Constitutional:  Elderly, frail, in mild respiratory distress. Afebrile. Eyes: PERRL, lids and conjunctivae normal ENMT: Mucous membranes are mildly dry. Posterior pharynx clear of any exudate or lesions.  Neck: normal, supple, no masses, no thyromegaly Respiratory: Tachypneic in the mid to high 20s. Decreased breath sounds on right base and left lung fields. Bilateral rhonchi. There is mild wheezing bilaterally, particularly on right middle lobe. No accessory muscle use. Cardiovascular: Regular rate and rhythm, no murmurs / rubs / gallops. No extremity edema. 2+ pedal pulses. No carotid bruits.  Abdomen: Soft, no tenderness, no masses palpated. No  hepatosplenomegaly. Bowel sounds positive.  Musculoskeletal: no clubbing / cyanosis. Good ROM, no contractures. Normal muscle tone.  Skin: no significant rashes, lesions, ulcers on limited exam. Neurologic: CN 2-12 grossly intact. Sensation intact, DTR normal. Strength 5/5 in all 4.  Psychiatric: Normal judgment and insight. Alert and oriented x 4. Normal mood.    Labs on Admission: I have personally reviewed following labs and imaging studies  CBC:  Recent Labs Lab 09/27/16 1900 09/28/16 0115 09/28/16 0238  WBC 10.4 10.1 8.9  NEUTROABS 6.0 6.0 5.0  HGB 13.3 12.0 11.0*  HCT 39.6 35.9* 33.4*  MCV 83.0 83.9 83.9  PLT 327 284 161   Basic Metabolic Panel:  Recent Labs Lab 09/27/16 1900 09/28/16 0115 09/28/16 0238  NA 134* 136 137  K 3.9 3.5 3.4*  CL 98* 102 104  CO2 29 28 28   GLUCOSE 129* 114* 113*  BUN 23* 18 17  CREATININE 0.92 0.76 0.61  CALCIUM 8.8* 8.6* 8.2*  MG  --   --  1.7  PHOS  --   --  3.1   GFR: Estimated Creatinine Clearance: 44.4 mL/min (by C-G formula based  on SCr of 0.61 mg/dL). Liver Function Tests:  Recent Labs Lab 09/28/16 0115 09/28/16 0238  AST 14* 14*  ALT 9* 9*  ALKPHOS 56 52  BILITOT 0.6 0.4  PROT 6.3* 5.8*  ALBUMIN 2.8* 2.5*   No results for input(s): LIPASE, AMYLASE in the last 168 hours. No results for input(s): AMMONIA in the last 168 hours. Coagulation Profile:  Recent Labs Lab 09/27/16 1900  INR 0.92   Cardiac Enzymes:  Recent Labs Lab 09/27/16 1900  TROPONINI <0.03   BNP (last 3 results) No results for input(s): PROBNP in the last 8760 hours. HbA1C: No results for input(s): HGBA1C in the last 72 hours. CBG: No results for input(s): GLUCAP in the last 168 hours. Lipid Profile: No results for input(s): CHOL, HDL, LDLCALC, TRIG, CHOLHDL, LDLDIRECT in the last 72 hours. Thyroid Function Tests: No results for input(s): TSH, T4TOTAL, FREET4, T3FREE, THYROIDAB in the last 72 hours. Anemia Panel: No results for  input(s): VITAMINB12, FOLATE, FERRITIN, TIBC, IRON, RETICCTPCT in the last 72 hours. Urine analysis: No results found for: COLORURINE, APPEARANCEUR, LABSPEC, PHURINE, GLUCOSEU, HGBUR, BILIRUBINUR, KETONESUR, PROTEINUR, UROBILINOGEN, NITRITE, LEUKOCYTESUR  Radiological Exams on Admission: Dg Chest 2 View  Result Date: 09/27/2016 CLINICAL DATA:  Productive cough. EXAM: CHEST  2 VIEW COMPARISON:  None. FINDINGS: Normal cardiac silhouette. There is diffuse airspace disease involving the LEFT lower lobe and lingula. RIGHT lung history of airspace disease. There is a nodule in the RIGHT lung which appears calcified measuring 6 mm. Aggressive osseous lesion IMPRESSION: 1. Extensive LEFT lower lobe and lingular pneumonia. Consider contrast chest CT to exclude central obstructing lesion. 2. Calcified granuloma in the RIGHT lung. Electronically Signed   By: Suzy Bouchard M.D.   On: 09/27/2016 20:40   Ct Chest W Contrast  Result Date: 09/27/2016 CLINICAL DATA:  Pneumonia recurrent cough, shortness of breath EXAM: CT CHEST WITH CONTRAST TECHNIQUE: Multidetector CT imaging of the chest was performed during intravenous contrast administration. CONTRAST:  180mL ISOVUE-300 IOPAMIDOL (ISOVUE-300) INJECTION 61% COMPARISON:  09/27/2016 FINDINGS: Cardiovascular: Non aneurysmal aorta. Minimal atherosclerotic calcifications. Normal heart size. No pericardial effusion. Mediastinum/Nodes: The midline trachea. No thyroid mass. Mild mediastinal adenopathy, largest lymph node is seen in the AP window and measures 1 cm. Esophagus within normal limits Lungs/Pleura: Tiny left pleural effusion. Extensive consolidations and ground-glass densities in the left lower and upper lobes with peripheral consolidations. Small amount of ground-glass density and consolidation in the right middle lobe. Subpleural nodularity in the right lower lobe. 12 mm right upper lobe pulmonary nodule. Upper Abdomen: No acute abnormality. Musculoskeletal: No  chest wall abnormality. No acute or significant osseous findings. IMPRESSION: 1. Extensive consolidations and ground-glass density throughout the left upper and lower lobes, most consistent with pneumonia. Additional infiltrate is present within the right middle lobe. Mild subpleural nodularity in the right lower lobe is suspicious for additional small foci of infection. Small left pleural effusion. Imaging follow-up to resolution is recommended 2. Mild mediastinal adenopathy, likely reactive Aortic Atherosclerosis (ICD10-I70.0). Electronically Signed   By: Donavan Foil M.D.   On: 09/27/2016 22:36    EKG: Independently reviewed. Vent. rate 122 BPM PR interval * ms QRS duration 85 ms QT/QTc 312/445 ms P-R-T axes 61 68 62 Sinus tachycardia Abnormal R-wave progression, late transition Nonspecific T abnormalities, anterior leads  Assessment/Plan Principal Problem:   Sepsis (HCC)\   HCAP (healthcare-associated pneumonia) Multiple episodes of pneumonia in the past 9 months. Per daughter, she spent 21 days at a [place] hospital last month receiving  treatment for this. Continue supplemental oxygen. Continue gentle IV hydration. Normal saline boluses as needed. Continue schedule and as needed bronchodilators. BiPAP ventilation as needed. Solu-Medrol 150 mg IVP 1 dose. Continue cefepime per pharmacy. Continue vancomycin per pharmacy. Check sputum Gram stain culture and sensitivity. Follow-up blood cultures and sensitivity. Check strep pneumoniae and legionella pneumophila urinary antigens.  Active Problems:   Acute respiratory failure (HCC) Consulted PCCM BiPAP ventilation as needed. Continue measures as above.    Tachyarrhythmia/abnormal EKG The patient and her daughter stated that she has a history of tachycardia. However, they did not specify which type of arrhythmia it was. Among the patient home meds there is Cardizem CD 180 mg and warfarin 5 mg. Atrial flutter versus atrial  fibrillation? She has not taken these medication since last week. Her daughter mentioned that she will bring medicines and medical records in the morning. Follow-up EKG, repeat troponin level and check echocardiogram.    Hypokalemia Replacing. Also supplementing magnesium.    Anemia Check anemia profile. Monitor hematocrit and hemoglobin.    Hypoalbuminemia Consult nutrition services later today.    Hyperlipidemia Recent lab work done at [facility] in June this year shows Normal triglycerides, total cholesterol of 236, LDL level 169 and HDL 42 mg/dL. The patient is not currently on therapy.    DVT prophylaxis: Heparin SQ. Code Status: Full code. Family Communication: History taken from her daughter Jari Favre via telephone call [phone]. Disposition Plan: Admit to stepdown for IV antibiotic therapy for several days and respiratory support. Consults called: Pulmonary critical care medicine. Admission status: Inpatient/stepdown.   Reubin Milan MD [facility] Pager [phone].  If 7PM-7AM, please contact night-coverage [email or web address] Password [email or web address]  09/28/2016, 4:21 AM

## 2016-09-28 NOTE — Progress Notes (Signed)
Pt admitted after midnight. For details, please refer to admission note done 8/16. Pt admitted with sepsis and pneumonia. Continue current abx regimen. Monitor in SDU for next 24 hours.  Leisa Lenz [facility] [phone]

## 2016-09-28 NOTE — Progress Notes (Signed)
Pt. Does not need ABG at this time. RN stated she contacted MD and that MD no longer wants the ABG. Pt. Sats currently in the high 90's, no distress at this time. Bipap is on standby if needed.

## 2016-09-28 NOTE — Consult Note (Signed)
Reason for consult: acute hypoxic resp failrue   In summary: 76 yo F with Hx of recurrent pneumonia presented with cough and signs of sepsis. CT scan confirmed the finding of pneumonia. Admitted to the ICU for close observation.  On my assessment: Tachycardiac, normal BP, warm and well perfused Awake and oriented Diminished air entry on the Lt side, no wheeze Abdomen soft, not tender Positive pulses  I reviewed labs and imaging  Patient in sepsis from recurrent pneumonia - Cultures, broad abx - gentle hydration, no lactic acidosis - monitor UOP and electrolytes Will follow  Samul Dada, MD CCM attending

## 2016-09-28 NOTE — Progress Notes (Signed)
  Echocardiogram 2D Echocardiogram has been performed.  Michelle Mccall M 09/28/2016, 8:49 AM

## 2016-09-28 NOTE — Care Management Note (Signed)
Case Management Note  Patient Details  Name: Michelle Mccall MRN: [id number] Date of Birth: October 22, 1940  Subjective/Objective:                  PNA  Action/Plan: Date:  September 28, 2016 Chart reviewed for concurrent status and case management needs. Will continue to follow patient progress. Discharge Planning: following for needs Expected discharge date: [id number] [name], BSN, [place], [place]  Expected Discharge Date:                  Expected Discharge Plan:  Home/Self Care  In-House Referral:     Discharge planning Services  CM Consult  Post Acute Care Choice:    Choice offered to:     DME Arranged:    DME Agency:     HH Arranged:    HH Agency:     Status of Service:  In process, will continue to follow  If discussed at Long Length of Stay Meetings, dates discussed:    Additional Comments:  Leeroy Cha, RN 09/28/2016, 8:17 AM

## 2016-09-28 NOTE — Care Management Note (Signed)
Case Management Note  Patient Details  Name: Michelle Mccall MRN: [id number] Date of Birth: Jul 14, 1940  Subjective/Objective:           sepsis         Action/Plan: Date:  September 28, 2016  Chart reviewed for concurrent status and case management needs.  Will continue to follow patient progress.  Discharge Planning: following for needs  Expected discharge date: [id number]  [name], BSN, [place], [place]   Expected Discharge Date:                  Expected Discharge Plan:  Home/Self Care  In-House Referral:     Discharge planning Services  CM Consult  Post Acute Care Choice:    Choice offered to:     DME Arranged:    DME Agency:     HH Arranged:    HH Agency:     Status of Service:  In process, will continue to follow  If discussed at Long Length of Stay Meetings, dates discussed:    Additional Comments:  Leeroy Cha, RN 09/28/2016, 9:27 AM

## 2016-09-28 NOTE — Progress Notes (Signed)
ANTICOAGULATION CONSULT NOTE - Initial Consult  Pharmacy Consult for Coumadin Indication: unspecified tachyarrhythmia  No Known Allergies  Patient Measurements: Height: 5\' 1"  (154.9 cm) Weight: 118 lb 2.7 oz (53.6 kg) IBW/kg (Calculated) : 47.8  Vital Signs: Temp: 97.9 F (36.6 C) (08/16 1200) Temp Source: Axillary (08/16 1200) BP: 95/63 (08/16 0731) Pulse Rate: 127 (08/16 0057)  Labs:  Recent Labs  09/27/16 1900 09/28/16 0115 09/28/16 0238 09/28/16 0428  HGB 13.3 12.0 11.0*  --   HCT 39.6 35.9* 33.4*  --   PLT 327 284 248  --   LABPROT 12.4  --   --   --   INR 0.92  --   --   --   CREATININE 0.92 0.76 0.61 0.56  TROPONINI <0.03  --   --  <0.03    Estimated Creatinine Clearance: 44.4 mL/min (by C-G formula based on SCr of 0.56 mg/dL).   Medical History: Past Medical History:  Diagnosis Date  . Hyperlipidemia   . Pneumonia   . Tachyarrhythmia     Medications:  Scheduled:  . aspirin  81 mg Oral Daily  . famotidine  20 mg Oral BID  . heparin  5,000 Units Subcutaneous Q8H  . ipratropium-albuterol  3 mL Nebulization Q6H  . mouth rinse  15 mL Mouth Rinse BID  . mometasone-formoterol  2 puff Inhalation BID   Infusions:  . 0.9 % NaCl with KCl 20 mEq / L 75 mL/hr at 09/28/16 0344  . ceFEPime (MAXIPIME) IV    . vancomycin     PRN: ondansetron (ZOFRAN) IV  Assessment: 76 yo female admitted with pneumonia. Her daughter reports that she is taking warfarin 5mg  daily for an abnormal heart rhythm but has not taken it in the past week. She was unable to say what physician monitors the INR.  INR is subtherapeutic at 0.92. Pharmacy is consulted to resume dosing now.  Goal of Therapy:  INR 2-3 Monitor platelets by anticoagulation protocol: Yes   Plan:   Warfarin 7.5mg  PO x 1 today  Daily PT/INR  Continue heparin 5000 units SQ q8h until INR therapeutic  Daughter will bring in the prescription bottle of warfarin to confirm the dosage  Peggyann Juba, PharmD,  BCPS Pager: [phone] 09/28/2016,12:30 PM

## 2016-09-28 NOTE — Progress Notes (Signed)
eLink Physician-Brief Progress Note Patient Name: Michelle Mccall DOB: [birth year] MRN: [id number]   Date of Service  09/28/2016  HPI/Events of Note  76 yo with sepsis , pneumonia  eICU Interventions  IVF's Iv abx Oxygen as needed     Intervention Category Evaluation Type: New Patient Evaluation  Flora Lipps 09/28/2016, 4:47 AM

## 2016-09-29 LAB — BASIC METABOLIC PANEL
ANION GAP: 5 (ref 5–15)
BUN: 13 mg/dL (ref 6–20)
CHLORIDE: 106 mmol/L (ref 101–111)
CO2: 26 mmol/L (ref 22–32)
Calcium: 8.8 mg/dL — ABNORMAL LOW (ref 8.9–10.3)
Creatinine, Ser: 0.6 mg/dL (ref 0.44–1.00)
GFR calc non Af Amer: >60 mL/min (ref >60)
GLUCOSE: 129 mg/dL — AB (ref 65–99)
POTASSIUM: 4.3 mmol/L (ref 3.5–5.1)
Sodium: 137 mmol/L (ref 135–145)

## 2016-09-29 LAB — CBC WITH DIFFERENTIAL/PLATELET
BASOS ABS: 0 10*3/uL (ref 0.0–0.1)
Basophils Relative: 0 %
Eosinophils Absolute: 0 10*3/uL (ref 0.0–0.7)
Eosinophils Relative: 0 %
HEMATOCRIT: 33.5 % — AB (ref 36.0–46.0)
HEMOGLOBIN: 11 g/dL — AB (ref 12.0–15.0)
LYMPHS PCT: 25 %
Lymphs Abs: 2.9 10*3/uL (ref 0.7–4.0)
MCH: 27.8 pg (ref 26.0–34.0)
MCHC: 32.8 g/dL (ref 30.0–36.0)
MCV: 84.8 fL (ref 78.0–100.0)
MONO ABS: 0.8 10*3/uL (ref 0.1–1.0)
Monocytes Relative: 7 %
NEUTROS ABS: 7.6 10*3/uL (ref 1.7–7.7)
NEUTROS PCT: 68 %
Platelets: 249 10*3/uL (ref 150–400)
RBC: 3.95 MIL/uL (ref 3.87–5.11)
RDW: 14.1 % (ref 11.5–15.5)
WBC: 11.4 10*3/uL — ABNORMAL HIGH (ref 4.0–10.5)

## 2016-09-29 LAB — INFLUENZA PANEL BY PCR (TYPE A & B)
INFLAPCR: NEGATIVE
INFLBPCR: NEGATIVE

## 2016-09-29 LAB — PROTIME-INR
INR: 0.93
Prothrombin Time: 12.4 seconds (ref 11.4–15.2)

## 2016-09-29 LAB — LEGIONELLA PNEUMOPHILA SEROGP 1 UR AG: L. pneumophila Serogp 1 Ur Ag: NEGATIVE

## 2016-09-29 MED ORDER — LORAZEPAM 0.5 MG PO TABS
0.5000 mg | ORAL_TABLET | Freq: Once | ORAL | Status: AC
  Administered 2016-09-29: 0.5 mg via ORAL
  Filled 2016-09-29: qty 1

## 2016-09-29 MED ORDER — WARFARIN SODIUM 5 MG PO TABS
7.5000 mg | ORAL_TABLET | Freq: Once | ORAL | Status: AC
  Administered 2016-09-29: 7.5 mg via ORAL
  Filled 2016-09-29: qty 1

## 2016-09-29 NOTE — Evaluation (Signed)
Physical Therapy Evaluation Patient Details Name: Michelle Mccall MRN: [id number] DOB: December 06, 1940 Today's Date: 09/29/2016   History of Present Illness  Pt admitted with dx sepsis 2* recurrent PNA  Clinical Impression  Pt admitted as above and presenting with functional mobility limitations 2* generalized weakness, mild ambulatory balance deficits and SOB with exertion.  Pt should progress to return home with dtr.    Follow Up Recommendations No PT follow up    Equipment Recommendations  None recommended by PT    Recommendations for Other Services OT consult     Precautions / Restrictions Precautions Precautions: Fall Restrictions Weight Bearing Restrictions: No      Mobility  Bed Mobility Overal bed mobility: Modified Independent             General bed mobility comments: Pt moves to EOB with increased time but unassisted  Transfers Overall transfer level: Needs assistance Equipment used: 1 person hand held assist Transfers: Sit to/from Stand Sit to Stand: Min guard         General transfer comment: for safety and to steady wtih initial standing  Ambulation/Gait Ambulation/Gait assistance: Min guard Ambulation Distance (Feet): 180 Feet (and 15' to bathroom) Assistive device: 1 person hand held assist Gait Pattern/deviations: Step-through pattern;Decreased step length - right;Decreased step length - left;Shuffle;Trunk flexed;Wide base of support     General Gait Details: Mild instability, cues to slow pace 2* SOB  Stairs            Wheelchair Mobility    Modified Rankin (Stroke Patients Only)       Balance Overall balance assessment: Needs assistance Sitting-balance support: No upper extremity supported;Feet supported Sitting balance-Leahy Scale: Good     Standing balance support: No upper extremity supported Standing balance-Leahy Scale: Fair                               Pertinent Vitals/Pain Pain Assessment: No/denies  pain    Home Living Family/patient expects to be discharged to:: Private residence Living Arrangements: Children Available Help at Discharge: Family Type of Home: House Home Access: Stairs to enter   Technical brewer of Steps: 2 Home Layout: One level Home Equipment: Cane - single point      Prior Function Level of Independence: Independent               Hand Dominance        Extremity/Trunk Assessment   Upper Extremity Assessment Upper Extremity Assessment: Generalized weakness    Lower Extremity Assessment Lower Extremity Assessment: Generalized weakness       Communication   Communication: Prefers language other than English  Cognition Arousal/Alertness: Awake/alert Behavior During Therapy: Impulsive Overall Cognitive Status: Within Functional Limits for tasks assessed                                        General Comments General comments (skin integrity, edema, etc.): Pt speaks only [place].  Interpreter [employer] (ID [id number]) utilized to communicate.    Exercises     Assessment/Plan    PT Assessment Patient needs continued PT services  PT Problem List Decreased strength;Decreased activity tolerance;Decreased balance;Decreased mobility       PT Treatment Interventions DME instruction;Gait training;Functional mobility training;Stair training;Therapeutic exercise;Therapeutic activities;Balance training;Patient/family education    PT Goals (Current goals can be found in the Care Plan section)  Acute  Rehab PT Goals Patient Stated Goal: Home with dtr PT Goal Formulation: With patient Time For Goal Achievement: 10/13/16 Potential to Achieve Goals: Good    Frequency Min 3X/week   Barriers to discharge        Co-evaluation               AM-PAC PT "6 Clicks" Daily Activity  Outcome Measure Difficulty turning over in bed (including adjusting bedclothes, sheets and blankets)?: A Little Difficulty moving from lying on  back to sitting on the side of the bed? : A Lot Difficulty sitting down on and standing up from a chair with arms (e.g., wheelchair, bedside commode, etc,.)?: A Little Help needed moving to and from a bed to chair (including a wheelchair)?: A Little Help needed walking in hospital room?: A Little Help needed climbing 3-5 steps with a railing? : A Little 6 Click Score: 17    End of Session Equipment Utilized During Treatment: Gait belt;Oxygen Activity Tolerance: Patient tolerated treatment well;Patient limited by fatigue Patient left: in chair;with call bell/phone within reach Nurse Communication: Mobility status PT Visit Diagnosis: Unsteadiness on feet (R26.81);Muscle weakness (generalized) (M62.81)    Time: 8250-5397 PT Time Calculation (min) (ACUTE ONLY): 37 min   Charges:   PT Evaluation $PT Eval Low Complexity: 1 Low PT Treatments $Gait Training: 8-22 mins   PT G Codes:        Pg [phone]   Neesa Knapik 09/29/2016, 4:13 PM

## 2016-09-29 NOTE — Progress Notes (Signed)
ANTICOAGULATION CONSULT NOTE - Initial Consult  Pharmacy Consult for Coumadin Indication: unspecified tachyarrhythmia  No Known Allergies  Patient Measurements: Height: 5\' 1"  (154.9 cm) Weight: 118 lb 2.7 oz (53.6 kg) IBW/kg (Calculated) : 47.8  Vital Signs: Temp: 97.6 F (36.4 C) (08/17 0800) Temp Source: Axillary (08/17 0800) BP: 106/44 (08/17 0600)  Labs:  Recent Labs  09/27/16 1900 09/28/16 0115 09/28/16 0238 09/28/16 0428 09/29/16 0249  HGB 13.3 12.0 11.0*  --  11.0*  HCT 39.6 35.9* 33.4*  --  33.5*  PLT 327 284 248  --  249  LABPROT 12.4  --   --   --  12.4  INR 0.92  --   --   --  0.93  CREATININE 0.92 0.76 0.61 0.56 0.60  TROPONINI <0.03  --   --  <0.03  --     Estimated Creatinine Clearance: 44.4 mL/min (by C-G formula based on SCr of 0.6 mg/dL).   Medical History: Past Medical History:  Diagnosis Date  . Hyperlipidemia   . Pneumonia   . Tachyarrhythmia     Medications:  Scheduled:  . aspirin  81 mg Oral Daily  . famotidine  20 mg Oral BID  . ipratropium-albuterol  3 mL Nebulization Q6H  . mouth rinse  15 mL Mouth Rinse BID  . mometasone-formoterol  2 puff Inhalation BID  . Warfarin - Pharmacist Dosing Inpatient   Does not apply q1800   Infusions:  . 0.9 % NaCl with KCl 20 mEq / L 50 mL/hr at 09/29/16 0911  . ceFEPime (MAXIPIME) IV Stopped (09/28/16 2038)  . vancomycin Stopped (09/28/16 2300)   PRN: ondansetron (ZOFRAN) IV  Assessment: 76 yo female admitted with pneumonia. Her daughter reports that she is taking warfarin 5mg  daily for an abnormal heart rhythm but has not taken it in the past week. She was unable to say what physician monitors the INR.  INR is subtherapeutic at 0.92. Pharmacy is consulted to resume dosing now.  09/29/2016:  INR remains at baseline 0.93  CBC- Hg low but stable, pltc WNL  No bleeding reported  Regular diet ordered- no intake charted  Drug-drug interactions- no major DDI.  Broad-spectrum antibiotics and  increase INR.   Goal of Therapy:  INR 2-3 Monitor platelets by anticoagulation protocol: Yes  Plan:   Repeat Warfarin 7.5mg  PO x 1 today  Daily PT/INR  Continue heparin 5000 units SQ q8h until INR therapeutic  Daughter will bring in the prescription bottle of warfarin to confirm the dosage  Netta Cedars, PharmD, BCPS Pager: [phone] 09/29/2016,11:41 AM

## 2016-09-29 NOTE — Progress Notes (Signed)
Patient ID: Michelle Mccall, female   DOB: 1940/09/10, 76 y.o.   MRN: [id number]  PROGRESS NOTE    Michelle Mccall  FXT:[id number] DOB: [birth year] DOA: 09/27/2016  PCP: No primary care provider on file.   Brief Narrative:  76 year old [place] female with history of dyslipidemia, unspecified tachyarrhythmia, multiple episodes of pneumonia in past 9 months, November and December 2017, April 2018. Patient transferred from [facility] with progressive shortness of breath at rest and on exertion. No fevers or chills. Patient apparently came back to [place] 09/13/2016 after traveling to [place] and apparently has had pneumonia while in [place] and she brought some medications with her but they were not helping relieve the symptoms. In ED, patient was afebrile, heart rate was 128, normal blood pressure and oxygen saturation was 94% on room air. She was started on broad-spectrum antibiotics while awaiting culture results. Chest x-ray showed left lower lobe and lingular pneumonia and calcified right lung granuloma. This was followed by CT chest with contrast which showed extensive consolidations of the left lower and upper lobes and infiltrate on the right middle lobe with a mild mediastinal adenopathy. When patient arrived to stepdown unit at the time of the admission, patient became tachycardic with pulse of 127, she was hypotensive with blood pressure of 79/50 despite at least 1.5 L normal saline bolus. Patient improved with nebulizer treatment and one dose of Solu-Medrol 125 mg IV, BiPAP. Critical care medicine was consulted.   Assessment & Plan:   Principal Problem:   Sepsis ([place]) / multilobar pneumonia / leukocytosis / HCAP / acute respiratory failure with hypoxia - Sepsis criteria met on the admission with source of infection suspected to be pneumonia - Chest x-ray showed extensive left lower lobe and lingular pneumonia - CT chest showed extensive consolidations and groundglass density throughout the  left upper and lower lobes most consistent with pneumonia. There are additional infiltrates present in the right middle lobe and mild subpleural nodularity in the right lower lobe suspicious for additional small focus of infection. Follow-up imaging recommended to ensure resolution. Also mild mediastinal adenopathy seen on CT scan likely is reactive - Continue vancomycin and cefepime - Respiratory culture so far shows no organisms, final report pending - Blood cultures so far show no growth - Continue oxygen support via nasal cannula to keep oxygen saturation above 90% - This morning, blood pressure 99/54, will keep in step down unit for next 24 hours until blood pressure is stable  Active Problems:   Hypokalemia - Likely in the setting of sepsis - Supplemented and within normal limits    Tachycardia - Likely in the setting of sepsis although patient has history of unspecified tacchyarrhythmia and was apparently on Cardizem and Coumadin - Heart rate controlled   - Patient on Coumadin    Normocytic anemia - Hgb stable       DVT prophylaxis: On coumadin  Code Status: full code  Family Communication: no family at the bedside  Disposition Plan: monitor in SDU   Consultants:   PT  Procedures:   None   Antimicrobials:   Cefepime and vanco 09/28/2016 -->   Subjective: Says she is still coughing.  Objective: Vitals:   09/29/16 0100 09/29/16 0200 09/29/16 0253 09/29/16 0345  BP: 94/60 (!) 99/54    Pulse:      Resp: (!) 21 (!) 23    Temp:    97.9 F (36.6 C)  TempSrc:    Oral  SpO2: 97% 93% 96%   Weight:  Height:        Intake/Output Summary (Last 24 hours) at 09/29/16 0539 Last data filed at 09/29/16 0400  Gross per 24 hour  Intake             1945 ml  Output             1200 ml  Net              745 ml   Filed Weights   09/27/16 1841 09/28/16 0057  Weight: 54.2 kg (119 lb 7.8 oz) 53.6 kg (118 lb 2.7 oz)    Examination:  General exam: Appears calm and  comfortable  Respiratory system: rhonchorous, mild wheezing in upper lung lobes  Cardiovascular system: S1 & S2 heard, Rate controlled  Gastrointestinal system: Abdomen is nondistended, soft and nontender. No organomegaly or masses felt. Normal bowel sounds heard. Central nervous system: Alert and oriented. No focal neurological deficits. Extremities: Symmetric 5 x 5 power. Skin: No rashes, lesions or ulcers Psychiatry: Judgement and insight appear normal. Mood & affect appropriate.   Data Reviewed: I have personally reviewed following labs and imaging studies  CBC:  Recent Labs Lab 09/27/16 1900 09/28/16 0115 09/28/16 0238 09/29/16 0249  WBC 10.4 10.1 8.9 11.4*  NEUTROABS 6.0 6.0 5.0 7.6  HGB 13.3 12.0 11.0* 11.0*  HCT 39.6 35.9* 33.4* 33.5*  MCV 83.0 83.9 83.9 84.8  PLT 327 284 248 425   Basic Metabolic Panel:  Recent Labs Lab 09/27/16 1900 09/28/16 0115 09/28/16 0238 09/28/16 0428 09/29/16 0249  NA 134* 136 137 137 137  K 3.9 3.5 3.4* 3.4* 4.3  CL 98* 102 104 104 106  CO2 '29 28 28 27 26  ' GLUCOSE 129* 114* 113* 114* 129*  BUN 23* '18 17 15 13  ' CREATININE 0.92 0.76 0.61 0.56 0.60  CALCIUM 8.8* 8.6* 8.2* 8.3* 8.8*  MG  --   --  1.7  --   --   PHOS  --   --  3.1  --   --    GFR: Estimated Creatinine Clearance: 44.4 mL/min (by C-G formula based on SCr of 0.6 mg/dL). Liver Function Tests:  Recent Labs Lab 09/28/16 0115 09/28/16 0238  AST 14* 14*  ALT 9* 9*  ALKPHOS 56 52  BILITOT 0.6 0.4  PROT 6.3* 5.8*  ALBUMIN 2.8* 2.5*   No results for input(s): LIPASE, AMYLASE in the last 168 hours. No results for input(s): AMMONIA in the last 168 hours. Coagulation Profile:  Recent Labs Lab 09/27/16 1900 09/29/16 0249  INR 0.92 0.93   Cardiac Enzymes:  Recent Labs Lab 09/27/16 1900 09/28/16 0428  TROPONINI <0.03 <0.03   BNP (last 3 results) No results for input(s): PROBNP in the last 8760 hours. HbA1C: No results for input(s): HGBA1C in the last 72  hours. CBG: No results for input(s): GLUCAP in the last 168 hours. Lipid Profile: No results for input(s): CHOL, HDL, LDLCALC, TRIG, CHOLHDL, LDLDIRECT in the last 72 hours. Thyroid Function Tests: No results for input(s): TSH, T4TOTAL, FREET4, T3FREE, THYROIDAB in the last 72 hours. Anemia Panel: No results for input(s): VITAMINB12, FOLATE, FERRITIN, TIBC, IRON, RETICCTPCT in the last 72 hours. Urine analysis: No results found for: COLORURINE, APPEARANCEUR, LABSPEC, PHURINE, GLUCOSEU, HGBUR, BILIRUBINUR, KETONESUR, PROTEINUR, UROBILINOGEN, NITRITE, LEUKOCYTESUR Sepsis Labs: '@[email or web address]' (procalcitonin:4,lacticidven:4)    Recent Results (from the past 240 hour(s))  Blood culture (routine x 2)     Status: None (Preliminary result)   Collection Time: 09/27/16  6:50 PM  Result Value  Ref Range Status   Specimen Description BLOOD RIGHT ANTECUBITAL  Final   Special Requests Blood Culture adequate volume IN PEDIATRIC BOTTLE  Final   Culture   Final    NO GROWTH < 12 HOURS Performed at [facility], [place] [address], [place], [place] [address]    Report Status PENDING  Incomplete  Blood culture (routine x 2)     Status: None (Preliminary result)   Collection Time: 09/27/16  7:05 PM  Result Value Ref Range Status   Specimen Description BLOOD BLOOD LEFT ARM  Final   Special Requests   Final    Blood Culture adequate volume BOTTLES DRAWN AEROBIC AND ANAEROBIC   Culture   Final    NO GROWTH < 12 HOURS Performed at [facility], [place] [address] [place], [place] [address]    Report Status PENDING  Incomplete  MRSA PCR Screening     Status: None   Collection Time: 09/28/16 12:56 AM  Result Value Ref Range Status   MRSA by PCR NEGATIVE NEGATIVE Final    Comment:        The GeneXpert MRSA Assay (FDA approved for NASAL specimens only), is one component of a comprehensive MRSA colonization surveillance program. It is not intended to diagnose MRSA infection nor to guide  or monitor treatment for MRSA infections.   Culture, sputum-assessment     Status: None   Collection Time: 09/28/16  4:37 AM  Result Value Ref Range Status   Specimen Description SPUTUM  Final   Special Requests Normal  Final   Sputum evaluation THIS SPECIMEN IS ACCEPTABLE FOR SPUTUM CULTURE  Final   Report Status 09/28/2016 FINAL  Final  Culture, respiratory (NON-Expectorated)     Status: None (Preliminary result)   Collection Time: 09/28/16  4:37 AM  Result Value Ref Range Status   Specimen Description SPUTUM  Final   Special Requests Normal Reflexed from [id number]  Final   Gram Stain   Final    NO WBC SEEN NO ORGANISMS SEEN Performed at [facility], [place] [address], [place], [place] [address]    Culture PENDING  Incomplete   Report Status PENDING  Incomplete      Radiology Studies: Dg Chest 2 View  Result Date: 09/27/2016 CLINICAL DATA:  Productive cough. EXAM: CHEST  2 VIEW COMPARISON:  None. FINDINGS: Normal cardiac silhouette. There is diffuse airspace disease involving the LEFT lower lobe and lingula. RIGHT lung history of airspace disease. There is a nodule in the RIGHT lung which appears calcified measuring 6 mm. Aggressive osseous lesion IMPRESSION: 1. Extensive LEFT lower lobe and lingular pneumonia. Consider contrast chest CT to exclude central obstructing lesion. 2. Calcified granuloma in the RIGHT lung. Electronically Signed   By: Suzy Bouchard M.D.   On: 09/27/2016 20:40   Ct Chest W Contrast  Result Date: 09/27/2016 CLINICAL DATA:  Pneumonia recurrent cough, shortness of breath EXAM: CT CHEST WITH CONTRAST TECHNIQUE: Multidetector CT imaging of the chest was performed during intravenous contrast administration. CONTRAST:  141m ISOVUE-300 IOPAMIDOL (ISOVUE-300) INJECTION 61% COMPARISON:  09/27/2016 FINDINGS: Cardiovascular: Non aneurysmal aorta. Minimal atherosclerotic calcifications. Normal heart size. No pericardial effusion. Mediastinum/Nodes: The midline  trachea. No thyroid mass. Mild mediastinal adenopathy, largest lymph node is seen in the AP window and measures 1 cm. Esophagus within normal limits Lungs/Pleura: Tiny left pleural effusion. Extensive consolidations and ground-glass densities in the left lower and upper lobes with peripheral consolidations. Small amount of ground-glass density and consolidation  in the right middle lobe. Subpleural nodularity in the right lower lobe. 12 mm right upper lobe pulmonary nodule. Upper Abdomen: No acute abnormality. Musculoskeletal: No chest wall abnormality. No acute or significant osseous findings. IMPRESSION: 1. Extensive consolidations and ground-glass density throughout the left upper and lower lobes, most consistent with pneumonia. Additional infiltrate is present within the right middle lobe. Mild subpleural nodularity in the right lower lobe is suspicious for additional small foci of infection. Small left pleural effusion. Imaging follow-up to resolution is recommended 2. Mild mediastinal adenopathy, likely reactive Aortic Atherosclerosis (ICD10-I70.0). Electronically Signed   By: Donavan Foil M.D.   On: 09/27/2016 22:36       Scheduled Meds: . aspirin  81 mg Oral Daily  . famotidine  20 mg Oral BID  . heparin  5,000 Units Subcutaneous Q8H  . ipratropium-albuterol  3 mL Nebulization Q6H  . mouth rinse  15 mL Mouth Rinse BID  . mometasone-formoterol  2 puff Inhalation BID  . Warfarin - Pharmacist Dosing Inpatient   Does not apply q1800   Continuous Infusions: . 0.9 % NaCl with KCl 20 mEq / L 75 mL/hr at 09/29/16 0400  . ceFEPime (MAXIPIME) IV Stopped (09/28/16 2038)  . vancomycin Stopped (09/28/16 2300)     LOS: 2 days    Time spent: 25 minutes  Greater than 50% of the time spent on counseling and coordinating the care.   Leisa Lenz, MD [facility] Pager [phone]  If 7PM-7AM, please contact night-coverage [email or web address] Password [facility] 09/29/2016, 5:39 AM

## 2016-09-30 ENCOUNTER — Inpatient Hospital Stay (HOSPITAL_COMMUNITY): Payer: Medicaid Other

## 2016-09-30 DIAGNOSIS — J9600 Acute respiratory failure, unspecified whether with hypoxia or hypercapnia: Secondary | ICD-10-CM

## 2016-09-30 LAB — CBC WITH DIFFERENTIAL/PLATELET
BASOS ABS: 0 10*3/uL (ref 0.0–0.1)
BASOS PCT: 0 %
EOS ABS: 0.3 10*3/uL (ref 0.0–0.7)
Eosinophils Relative: 2 %
HCT: 35.7 % — ABNORMAL LOW (ref 36.0–46.0)
HEMOGLOBIN: 11.4 g/dL — AB (ref 12.0–15.0)
LYMPHS PCT: 28 %
Lymphs Abs: 3 10*3/uL (ref 0.7–4.0)
MCH: 27.7 pg (ref 26.0–34.0)
MCHC: 31.9 g/dL (ref 30.0–36.0)
MCV: 86.7 fL (ref 78.0–100.0)
Monocytes Absolute: 0.8 10*3/uL (ref 0.1–1.0)
Monocytes Relative: 7 %
NEUTROS ABS: 6.8 10*3/uL (ref 1.7–7.7)
Neutrophils Relative %: 63 %
Platelets: 287 10*3/uL (ref 150–400)
RBC: 4.12 MIL/uL (ref 3.87–5.11)
RDW: 14.5 % (ref 11.5–15.5)
WBC: 10.8 10*3/uL — AB (ref 4.0–10.5)

## 2016-09-30 LAB — BLOOD GAS, ARTERIAL
Acid-Base Excess: 4.3 mmol/L — ABNORMAL HIGH (ref 0.0–2.0)
BICARBONATE: 29.4 mmol/L — AB (ref 20.0–28.0)
Drawn by: 441261
O2 Content: 7 L/min
O2 Saturation: 95.8 %
PATIENT TEMPERATURE: 98.6
PO2 ART: 80.7 mmHg — AB (ref 83.0–108.0)
pCO2 arterial: 48 mmHg (ref 32.0–48.0)
pH, Arterial: 7.404 (ref 7.350–7.450)

## 2016-09-30 LAB — BASIC METABOLIC PANEL
ANION GAP: 5 (ref 5–15)
BUN: 15 mg/dL (ref 6–20)
CHLORIDE: 104 mmol/L (ref 101–111)
CO2: 28 mmol/L (ref 22–32)
CREATININE: 0.53 mg/dL (ref 0.44–1.00)
Calcium: 8.6 mg/dL — ABNORMAL LOW (ref 8.9–10.3)
GFR calc non Af Amer: >60 mL/min (ref >60)
Glucose, Bld: 104 mg/dL — ABNORMAL HIGH (ref 65–99)
POTASSIUM: 4.5 mmol/L (ref 3.5–5.1)
SODIUM: 137 mmol/L (ref 135–145)

## 2016-09-30 LAB — CULTURE, RESPIRATORY W GRAM STAIN: Special Requests: NORMAL

## 2016-09-30 LAB — PROTIME-INR
INR: 1.12
PROTHROMBIN TIME: 14.5 s (ref 11.4–15.2)

## 2016-09-30 LAB — CULTURE, RESPIRATORY
CULTURE: NORMAL
GRAM STAIN: NONE SEEN

## 2016-09-30 MED ORDER — ENOXAPARIN SODIUM 60 MG/0.6ML ~~LOC~~ SOLN
60.0000 mg | Freq: Two times a day (BID) | SUBCUTANEOUS | Status: DC
  Administered 2016-09-30 – 2016-10-01 (×4): 60 mg via SUBCUTANEOUS
  Filled 2016-09-30 (×5): qty 0.6

## 2016-09-30 MED ORDER — ALBUTEROL SULFATE (2.5 MG/3ML) 0.083% IN NEBU
2.5000 mg | INHALATION_SOLUTION | RESPIRATORY_TRACT | Status: DC | PRN
  Administered 2016-09-30: 2.5 mg via RESPIRATORY_TRACT
  Filled 2016-09-30: qty 3

## 2016-09-30 MED ORDER — IPRATROPIUM BROMIDE 0.02 % IN SOLN: 0.5000 mg | RESPIRATORY_TRACT | Status: DC | PRN

## 2016-09-30 MED ORDER — IPRATROPIUM BROMIDE 0.02 % IN SOLN
0.5000 mg | RESPIRATORY_TRACT | Status: DC
  Administered 2016-09-30: 0.5 mg via RESPIRATORY_TRACT
  Filled 2016-09-30: qty 2.5

## 2016-09-30 MED ORDER — IPRATROPIUM BROMIDE 0.02 % IN SOLN: 0.5000 mg | Freq: Four times a day (QID) | RESPIRATORY_TRACT | Status: DC | PRN

## 2016-09-30 MED ORDER — IPRATROPIUM BROMIDE 0.02 % IN SOLN
0.5000 mg | Freq: Four times a day (QID) | RESPIRATORY_TRACT | Status: DC
  Administered 2016-09-30 – 2016-10-03 (×13): 0.5 mg via RESPIRATORY_TRACT
  Filled 2016-09-30 (×14): qty 2.5

## 2016-09-30 MED ORDER — LEVALBUTEROL HCL 1.25 MG/0.5ML IN NEBU
1.2500 mg | INHALATION_SOLUTION | Freq: Four times a day (QID) | RESPIRATORY_TRACT | Status: DC
  Administered 2016-09-30 – 2016-10-03 (×13): 1.25 mg via RESPIRATORY_TRACT
  Filled 2016-09-30 (×14): qty 0.5

## 2016-09-30 MED ORDER — WARFARIN SODIUM 5 MG PO TABS
7.5000 mg | ORAL_TABLET | Freq: Once | ORAL | Status: AC
  Administered 2016-09-30: 7.5 mg via ORAL
  Filled 2016-09-30: qty 1

## 2016-09-30 MED ORDER — LEVALBUTEROL HCL 1.25 MG/0.5ML IN NEBU
1.2500 mg | INHALATION_SOLUTION | Freq: Four times a day (QID) | RESPIRATORY_TRACT | Status: DC | PRN
  Administered 2016-10-13 (×2): 1.25 mg via RESPIRATORY_TRACT
  Filled 2016-09-30 (×2): qty 0.5

## 2016-09-30 MED ORDER — METHYLPREDNISOLONE SODIUM SUCC 125 MG IJ SOLR
60.0000 mg | Freq: Four times a day (QID) | INTRAMUSCULAR | Status: DC
  Administered 2016-09-30 – 2016-10-02 (×8): 60 mg via INTRAVENOUS
  Filled 2016-09-30 (×8): qty 2

## 2016-09-30 MED ORDER — METOPROLOL TARTRATE 5 MG/5ML IV SOLN
2.5000 mg | INTRAVENOUS | Status: DC | PRN
  Administered 2016-09-30: 2.5 mg via INTRAVENOUS
  Administered 2016-09-30 – 2016-10-01 (×3): 5 mg via INTRAVENOUS
  Filled 2016-09-30 (×3): qty 5

## 2016-09-30 MED ORDER — LEVALBUTEROL HCL 1.25 MG/0.5ML IN NEBU
1.2500 mg | INHALATION_SOLUTION | RESPIRATORY_TRACT | Status: DC
  Administered 2016-09-30: 1.25 mg via RESPIRATORY_TRACT
  Filled 2016-09-30: qty 0.5

## 2016-09-30 MED ORDER — METOPROLOL TARTRATE 5 MG/5ML IV SOLN
INTRAVENOUS | Status: AC
  Filled 2016-09-30: qty 5

## 2016-09-30 NOTE — Progress Notes (Addendum)
PULMONARY / CRITICAL CARE MEDICINE   Name: Michelle Mccall MRN: [id number] DOB: [birth year]    ADMISSION DATE:  09/27/2016 CONSULTATION DATE:    Loistine Chance MD:  Neil Crouch MD  CHIEF COMPLAINT:  Sepsis, pneumonia  HISTORY OF PRESENT ILLNESS:   76 year old with history of hyperlipidemia, recurrent admissions for pneumonia, tachyarrhythmia admitted with worsening dyspnea. PCCM consulted on 8/16. We were called back on 8/18 for worsening respiratory status with rhonchi  PAST MEDICAL HISTORY :  She  has a past medical history of Hyperlipidemia; Pneumonia; and Tachyarrhythmia.  PAST SURGICAL HISTORY: She  has a past surgical history that includes Back surgery.  No Known Allergies  No current facility-administered medications on file prior to encounter.    No current outpatient prescriptions on file prior to encounter.    FAMILY HISTORY:  Her indicated that the status of her other is unknown.    SOCIAL HISTORY: She  reports that she has never smoked. She has never used smokeless tobacco. She reports that she does not drink alcohol or use drugs.  REVIEW OF SYSTEMS:   Unable to obtain due to language barrier  SUBJECTIVE:    VITAL SIGNS: BP 130/83   Pulse (!) 138   Temp 98.6 F (37 C) (Oral)   Resp (!) 32   Ht 5\' 1"  (1.549 m)   Wt 60.2 kg (132 lb 11.5 oz)   SpO2 96%   BMI 25.08 kg/m   HEMODYNAMICS:    VENTILATOR SETTINGS:    INTAKE / OUTPUT: I/O last 3 completed shifts: In: 3094.6 [P.O.:840; I.V.:2054.6; IV Piggyback:200] Out: 600 [Urine:600]  PHYSICAL EXAMINATION: Blood pressure (!) 86/57, pulse (!) 138, temperature 98.6 F (37 C), temperature source Oral, resp. rate (!) 36, height 5\' 1"  (1.549 m), weight 60.2 kg (132 lb 11.5 oz), SpO2 94 %. Gen:      Mild resp distress HEENT:  EOMI, sclera anicteric Neck:     No masses; no thyromegaly Lungs:    B/L crackles. Wheezing on rt, diminished sounds on lt; normal respiratory effort CV:         Regular rate and  rhythm; no murmurs Abd:      + bowel sounds; soft, non-tender; no palpable masses, no distension Ext:    No edema; adequate peripheral perfusion Skin:      Warm and dry; no rash Neuro: No focal deficits  LABS:  BMET  Recent Labs Lab 09/28/16 0428 09/29/16 0249 09/30/16 0259  NA 137 137 137  K 3.4* 4.3 4.5  CL 104 106 104  CO2 27 26 28   BUN 15 13 15   CREATININE 0.56 0.60 0.53  GLUCOSE 114* 129* 104*    Electrolytes  Recent Labs Lab 09/28/16 0238 09/28/16 0428 09/29/16 0249 09/30/16 0259  CALCIUM 8.2* 8.3* 8.8* 8.6*  MG 1.7  --   --   --   PHOS 3.1  --   --   --     CBC  Recent Labs Lab 09/28/16 0238 09/29/16 0249 09/30/16 0259  WBC 8.9 11.4* 10.8*  HGB 11.0* 11.0* 11.4*  HCT 33.4* 33.5* 35.7*  PLT 248 249 287    Coag's  Recent Labs Lab 09/27/16 1900 09/29/16 0249 09/30/16 0259  INR 0.92 0.93 1.12    Sepsis Markers  Recent Labs Lab 09/27/16 1911 09/28/16 0115 09/28/16 0428  LATICACIDVEN 1.72 1.3 1.0  PROCALCITON  --  <0.10  --     ABG  Recent Labs Lab 09/30/16 0749  PHART 7.404  PCO2ART 48.0  PO2ART  80.7*    Liver Enzymes  Recent Labs Lab 09/28/16 0115 09/28/16 0238  AST 14* 14*  ALT 9* 9*  ALKPHOS 56 52  BILITOT 0.6 0.4  ALBUMIN 2.8* 2.5*    Cardiac Enzymes  Recent Labs Lab 09/27/16 1900 09/28/16 0428  TROPONINI <0.03 <0.03    Glucose No results for input(s): GLUCAP in the last 168 hours.  Imaging Dg Chest Port 1 View  Result Date: 09/30/2016 CLINICAL DATA:  Congestive productive cough, sob, admitted for sepsis, tachycardia, hx multiple pneumonias since November of last year. EXAM: PORTABLE CHEST 1 VIEW COMPARISON:  Chest CT and chest radiographs, 09/27/2016. FINDINGS: There is consolidation throughout most of the left lung, sparing the left apex. Lung consolidation in the upper lobe on the left has mildly increased from the prior exams. Right lung is hyperexpanded. Stable granuloma in the inferior right upper  lobe. Right lung is otherwise clear. Cardiac silhouette is mostly obscured by the left lung consolidation. Cardiac silhouette is grossly normal in size. No pneumothorax. IMPRESSION: 1. Mild worsening of left lung aeration when compared to the prior exams. Consolidation has increased in the left upper lobe and persists throughout the remainder of the left lung sparing only the left apex. Electronically Signed   By: Lajean Manes M.D.   On: 09/30/2016 08:19    STUDIES:    CULTURES:   ANTIBIOTICS: Vanco 8/16 > Cefepime 8/16 >  SIGNIFICANT EVENTS: 8/16> admit with multilobar PNA  LINES/TUBES:   DISCUSSION: 76 year old with recurrent pneumonia. Admitted with acute hypoxic respiratory failure, multilobar infiltrates. Pct noted to be low Needs eval for alternate causes of infiltrate such as ILD,  ASSESSMENT / PLAN:  PULMONARY A: Acute hypoxic resp failure Multi lobar lt infiltrates P:   Continue abx Follow cultures Pct Continue xopenex and atrovent standing nebs Start solumedrol as she is wheezing.  At risk for needing intubation Check ANA, ANCA, RF and CCP  CARDIOVASCULAR A:  Tachycardia.  Echo noted with RV dilation and pulm HTN. ? ASD P:  Continue anticoagulation Repeat echo with bubble when she is more stable. Lopressor PRN, Recheck EKG  RENAL A:   Stable  P:   Monitor urine output and Cr  GASTROINTESTINAL A:   Stable P:   Keep NPO  HEMATOLOGIC A:   Mild leukocytosis P:  Monitior CBC  INFECTIOUS A:   Pneumonia HIV negative P:   Continue abx.  Follow urine output and cr   FAMILY  - Updates: No family at bedside - Inter-disciplinary family meet or Palliative Care meeting due by:  10/07/16  The patient is critically ill with multiple organ system failure and requires high complexity decision making for assessment and support, frequent evaluation and titration of therapies, advanced monitoring, review of radiographic studies and interpretation of  complex data.   Critical Care Time devoted to patient care services, exclusive of separately billable procedures, described in this note is 35 minutes.   Marshell Garfinkel MD [facility] Pager [phone] If no answer or after 3pm call: [phone] 09/30/2016, 12:55 PM    P[facility] and [place] Pager: [phone]  09/30/2016, 12:26 PM

## 2016-09-30 NOTE — Progress Notes (Signed)
ANTICOAGULATION CONSULT NOTE - Consult  Pharmacy Consult for Lovenox & Coumadin Indication: unspecified tachycardia  No Known Allergies  Patient Measurements: Height: 5\' 1"  (154.9 cm) Weight: 132 lb 11.5 oz (60.2 kg) IBW/kg (Calculated) : 47.8  Vital Signs: Temp: 97.8 F (36.6 C) (08/18 0421) Temp Source: Oral (08/18 0421) BP: 147/119 (08/18 0700)  Labs:  Recent Labs  09/27/16 1900  09/28/16 0238 09/28/16 0428 09/29/16 0249 09/30/16 0259  HGB 13.3  < > 11.0*  --  11.0* 11.4*  HCT 39.6  < > 33.4*  --  33.5* 35.7*  PLT 327  < > 248  --  249 287  LABPROT 12.4  --   --   --  12.4 14.5  INR 0.92  --   --   --  0.93 1.12  CREATININE 0.92  < > 0.61 0.56 0.60 0.53  TROPONINI <0.03  --   --  <0.03  --   --   < > = values in this interval not displayed.  Estimated Creatinine Clearance: 49.1 mL/min (by C-G formula based on SCr of 0.53 mg/dL).   Medical History: Past Medical History:  Diagnosis Date  . Hyperlipidemia   . Pneumonia   . Tachyarrhythmia     Medications:  Scheduled:  . aspirin  81 mg Oral Daily  . enoxaparin (LOVENOX) injection  60 mg Subcutaneous Q12H  . famotidine  20 mg Oral BID  . ipratropium  0.5 mg Nebulization Q4H  . levalbuterol  1.25 mg Nebulization Q4H  . mouth rinse  15 mL Mouth Rinse BID  . Warfarin - Pharmacist Dosing Inpatient   Does not apply q1800   Infusions:  . 0.9 % NaCl with KCl 20 mEq / L 50 mL/hr at 09/30/16 0513  . ceFEPime (MAXIPIME) IV Stopped (09/29/16 2059)   PRN: ipratropium, levalbuterol, ondansetron (ZOFRAN) IV  Assessment: 76 yo female admitted with pneumonia. Her daughter reports that she is taking warfarin 5mg  daily for an abnormal heart rhythm but has not taken it in the past week. She was unable to say what physician monitors the INR.  INR is subtherapeutic at 0.92. Pharmacy is consulted to resume dosing now.  09/30/2016:  INR = 1.12  CBC- Hg low but stable, pltc WNL  No bleeding reported  Regular diet  ordered  Drug-drug interactions- no major DDI.  Broad-spectrum antibiotics and increase INR.   Pharmacy consulted today to begin dosing Lovenox for AFib (sq heparin has been d/c'ed and last dose given 8/16 @ 21:51)  Goal of Therapy:  INR 2-3 Monitor platelets by anticoagulation protocol: Yes  Plan:   Repeat Warfarin 7.5mg  PO x 1 today  Daily PT/INR  Lovenox 60mg  sq q12h to begin today until INR therapeutic  F/U with Daughter, who will bring in the prescription bottle of warfarin to confirm the dosage  Leone Haven, PharmD 09/30/2016,7:24 AM

## 2016-09-30 NOTE — Progress Notes (Addendum)
Patient ID: Michelle Mccall, female   DOB: 1941-02-10, 76 y.o.   MRN: [id number]  PROGRESS NOTE    Michelle Mccall  NAT:[id number] DOB: 05-May-1940 DOA: 09/27/2016  PCP: No primary care provider on file.   Brief Narrative:  76 year old [place] female with history of dyslipidemia, unspecified tachyarrhythmia, multiple episodes of pneumonia in past 9 months, November and December 2017, April 2018. Patient transferred from [facility] with progressive shortness of breath at rest and on exertion. No fevers or chills. Patient apparently came back to [place] 09/13/2016 after traveling to [place] and apparently has had pneumonia while in [place] and she brought some medications with her but they were not helping relieve the symptoms. In ED, patient was afebrile, heart rate was 128, normal blood pressure and oxygen saturation was 94% on room air. She was started on broad-spectrum antibiotics while awaiting culture results. Chest x-ray showed left lower lobe and lingular pneumonia and calcified right lung granuloma. This was followed by CT chest with contrast which showed extensive consolidations of the left lower and upper lobes and infiltrate on the right middle lobe with a mild mediastinal adenopathy. When patient arrived to stepdown unit at the time of the admission, patient became tachycardic with pulse of 127, she was hypotensive with blood pressure of 79/50 despite at least 1.5 L normal saline bolus. Patient improved with nebulizer treatment and one dose of Solu-Medrol 125 mg IV, BiPAP. Patient is more rhonchorous this am, has increased work of breathing. CCM consulted.    Assessment & Plan:   Principal Problem:   Sepsis ([place]) / multilobar pneumonia / leukocytosis / HCAP  - Sepsis criteria met on the admission with source of infection suspected to be pneumonia - Chest x-ray showed extensive left lower lobe and lingular pneumonia - CT chest showed extensive consolidations and groundglass density  throughout the left upper and lower lobes most consistent with pneumonia. There are additional infiltrates present in the right middle lobe and mild subpleural nodularity in the right lower lobe suspicious for additional small focus of infection. Follow-up imaging recommended to ensure resolution. Also mild mediastinal adenopathy seen on CT scan likely is reactive - Continue cefepime and stop vanco - Influenza PCR is negative - Resp cx re-incubated for better growth  - Blood cx negative so far    Active Problems:   Acute respiratory failure with hypoxia - Likely due to HCAP - More rhonchorous this am - She is currently saturating 91% on [place] oxygen - CCM consulted - Obtain CXR this am and ABG - Changed nebs to xopenex and atrovent instead of duoneb and albuterol due to tachycardia     Hypokalemia - Due to sepsis- Supplemented  - Supplemented and within normal limits    Tachycardia - Likely in the setting of sepsis although patient has history of unspecified tacchyarrhythmia and was apparently on Cardizem and Coumadin - HR in 140's - Obtain 12 lead EKG - On coumadin; will bridge with subQ lovenox as INR 1.12    Normocytic anemia - Hgb 11.4 this am      DVT prophylaxis: Coumadin Code Status: full code  Family Communication: family not at the bedside; called pt daughter over the phone, no answer Disposition Plan: monitor in SDU   Consultants:   PCCM  PT  Procedures:   None  Antimicrobials:   Cefepime 8/16 -->  Vanco 8/16 --> 8/19   Subjective: In more resp distress this am.  Objective: Vitals:   09/30/16 0400 09/30/16 0421 09/30/16 0500 09/30/16 0600  BP: 132/85  (!) 139/97 (!) 149/91  Pulse:      Resp: (!) 25  (!) 38 (!) 40  Temp:  97.8 F (36.6 C)    TempSrc:  Oral    SpO2: 90%  92% 91%  Weight:  60.2 kg (132 lb 11.5 oz)    Height:        Intake/Output Summary (Last 24 hours) at 09/30/16 0645 Last data filed at 09/30/16 0600  Gross per 24 hour    Intake          2119.58 ml  Output              400 ml  Net          1719.58 ml   Filed Weights   09/27/16 1841 09/28/16 0057 09/30/16 0421  Weight: 54.2 kg (119 lb 7.8 oz) 53.6 kg (118 lb 2.7 oz) 60.2 kg (132 lb 11.5 oz)    Physical Exam  Constitutional: appears in distress, short of breath CVS: tachycardic, S1/S2 + Pulmonary: rhonchorous, wheezing (+) Abdominal: Soft. BS +,  no distension, tenderness, rebound or guarding.  Musculoskeletal: Normal range of motion. No edema and no tenderness.  Lymphadenopathy: No lymphadenopathy noted, cervical, inguinal. Neuro: Alert. Normal reflexes, muscle tone coordination. No cranial nerve deficit. Skin: Skin is warm and dry. Psychiatric: Normal mood and affect. Behavior, judgment, thought content normal.     Data Reviewed: I have personally reviewed following labs and imaging studies  CBC:  Recent Labs Lab 09/27/16 1900 09/28/16 0115 09/28/16 0238 09/29/16 0249 09/30/16 0259  WBC 10.4 10.1 8.9 11.4* 10.8*  NEUTROABS 6.0 6.0 5.0 7.6 6.8  HGB 13.3 12.0 11.0* 11.0* 11.4*  HCT 39.6 35.9* 33.4* 33.5* 35.7*  MCV 83.0 83.9 83.9 84.8 86.7  PLT 327 284 248 249 469   Basic Metabolic Panel:  Recent Labs Lab 09/28/16 0115 09/28/16 0238 09/28/16 0428 09/29/16 0249 09/30/16 0259  NA 136 137 137 137 137  K 3.5 3.4* 3.4* 4.3 4.5  CL 102 104 104 106 104  CO2 '28 28 27 26 28  ' GLUCOSE 114* 113* 114* 129* 104*  BUN '18 17 15 13 15  ' CREATININE 0.76 0.61 0.56 0.60 0.53  CALCIUM 8.6* 8.2* 8.3* 8.8* 8.6*  MG  --  1.7  --   --   --   PHOS  --  3.1  --   --   --    GFR: Estimated Creatinine Clearance: 49.1 mL/min (by C-G formula based on SCr of 0.53 mg/dL). Liver Function Tests:  Recent Labs Lab 09/28/16 0115 09/28/16 0238  AST 14* 14*  ALT 9* 9*  ALKPHOS 56 52  BILITOT 0.6 0.4  PROT 6.3* 5.8*  ALBUMIN 2.8* 2.5*   No results for input(s): LIPASE, AMYLASE in the last 168 hours. No results for input(s): AMMONIA in the last 168  hours. Coagulation Profile:  Recent Labs Lab 09/27/16 1900 09/29/16 0249 09/30/16 0259  INR 0.92 0.93 1.12   Cardiac Enzymes:  Recent Labs Lab 09/27/16 1900 09/28/16 0428  TROPONINI <0.03 <0.03   BNP (last 3 results) No results for input(s): PROBNP in the last 8760 hours. HbA1C: No results for input(s): HGBA1C in the last 72 hours. CBG: No results for input(s): GLUCAP in the last 168 hours. Lipid Profile: No results for input(s): CHOL, HDL, LDLCALC, TRIG, CHOLHDL, LDLDIRECT in the last 72 hours. Thyroid Function Tests: No results for input(s): TSH, T4TOTAL, FREET4, T3FREE, THYROIDAB in the last 72 hours. Anemia Panel: No results for  input(s): VITAMINB12, FOLATE, FERRITIN, TIBC, IRON, RETICCTPCT in the last 72 hours. Urine analysis: No results found for: COLORURINE, APPEARANCEUR, LABSPEC, PHURINE, GLUCOSEU, HGBUR, BILIRUBINUR, KETONESUR, PROTEINUR, UROBILINOGEN, NITRITE, LEUKOCYTESUR Sepsis Labs: '@[email or web address]' (procalcitonin:4,lacticidven:4)    Recent Results (from the past 240 hour(s))  Blood culture (routine x 2)     Status: None (Preliminary result)   Collection Time: 09/27/16  6:50 PM  Result Value Ref Range Status   Specimen Description BLOOD RIGHT ANTECUBITAL  Final   Special Requests Blood Culture adequate volume IN PEDIATRIC BOTTLE  Final   Culture   Final    NO GROWTH 2 DAYS Performed at [facility], [place] [address], [place], [place] [address]    Report Status PENDING  Incomplete  Blood culture (routine x 2)     Status: None (Preliminary result)   Collection Time: 09/27/16  7:05 PM  Result Value Ref Range Status   Specimen Description BLOOD BLOOD LEFT ARM  Final   Special Requests   Final    Blood Culture adequate volume BOTTLES DRAWN AEROBIC AND ANAEROBIC   Culture   Final    NO GROWTH 2 DAYS Performed at [facility], [place] [address], [place], [place] [address]    Report Status PENDING  Incomplete  MRSA PCR Screening     Status: None    Collection Time: 09/28/16 12:56 AM  Result Value Ref Range Status   MRSA by PCR NEGATIVE NEGATIVE Final    Comment:        The GeneXpert MRSA Assay (FDA approved for NASAL specimens only), is one component of a comprehensive MRSA colonization surveillance program. It is not intended to diagnose MRSA infection nor to guide or monitor treatment for MRSA infections.   Culture, sputum-assessment     Status: None   Collection Time: 09/28/16  4:37 AM  Result Value Ref Range Status   Specimen Description SPUTUM  Final   Special Requests Normal  Final   Sputum evaluation THIS SPECIMEN IS ACCEPTABLE FOR SPUTUM CULTURE  Final   Report Status 09/28/2016 FINAL  Final  Culture, respiratory (NON-Expectorated)     Status: None (Preliminary result)   Collection Time: 09/28/16  4:37 AM  Result Value Ref Range Status   Specimen Description SPUTUM  Final   Special Requests Normal Reflexed from [id number]  Final   Gram Stain NO WBC SEEN NO ORGANISMS SEEN   Final   Culture   Final    CULTURE REINCUBATED FOR BETTER GROWTH Performed at [facility], [place] [address] [place], [place] [address]    Report Status PENDING  Incomplete      Radiology Studies: Dg Chest 2 View  Result Date: 09/27/2016 CLINICAL DATA:  Productive cough. EXAM: CHEST  2 VIEW COMPARISON:  None. FINDINGS: Normal cardiac silhouette. There is diffuse airspace disease involving the LEFT lower lobe and lingula. RIGHT lung history of airspace disease. There is a nodule in the RIGHT lung which appears calcified measuring 6 mm. Aggressive osseous lesion IMPRESSION: 1. Extensive LEFT lower lobe and lingular pneumonia. Consider contrast chest CT to exclude central obstructing lesion. 2. Calcified granuloma in the RIGHT lung. Electronically Signed   By: Suzy Bouchard M.D.   On: 09/27/2016 20:40   Ct Chest W Contrast  Result Date: 09/27/2016 CLINICAL DATA:  Pneumonia recurrent cough, shortness of breath EXAM: CT CHEST WITH CONTRAST  TECHNIQUE: Multidetector CT imaging of the chest was performed during intravenous contrast administration. CONTRAST:  119m ISOVUE-300 IOPAMIDOL (ISOVUE-300) INJECTION 61%  COMPARISON:  09/27/2016 FINDINGS: Cardiovascular: Non aneurysmal aorta. Minimal atherosclerotic calcifications. Normal heart size. No pericardial effusion. Mediastinum/Nodes: The midline trachea. No thyroid mass. Mild mediastinal adenopathy, largest lymph node is seen in the AP window and measures 1 cm. Esophagus within normal limits Lungs/Pleura: Tiny left pleural effusion. Extensive consolidations and ground-glass densities in the left lower and upper lobes with peripheral consolidations. Small amount of ground-glass density and consolidation in the right middle lobe. Subpleural nodularity in the right lower lobe. 12 mm right upper lobe pulmonary nodule. Upper Abdomen: No acute abnormality. Musculoskeletal: No chest wall abnormality. No acute or significant osseous findings. IMPRESSION: 1. Extensive consolidations and ground-glass density throughout the left upper and lower lobes, most consistent with pneumonia. Additional infiltrate is present within the right middle lobe. Mild subpleural nodularity in the right lower lobe is suspicious for additional small foci of infection. Small left pleural effusion. Imaging follow-up to resolution is recommended 2. Mild mediastinal adenopathy, likely reactive Aortic Atherosclerosis (ICD10-I70.0). Electronically Signed   By: Donavan Foil M.D.   On: 09/27/2016 22:36       Scheduled Meds: . aspirin  81 mg Oral Daily  . famotidine  20 mg Oral BID  . ipratropium-albuterol  3 mL Nebulization Q6H  . mouth rinse  15 mL Mouth Rinse BID  . mometasone-formoterol  2 puff Inhalation BID  . Warfarin - Pharmacist Dosing Inpatient   Does not apply q1800   Continuous Infusions: . 0.9 % NaCl with KCl 20 mEq / L 50 mL/hr at 09/30/16 0513  . ceFEPime (MAXIPIME) IV Stopped (09/29/16 2059)  . vancomycin  Stopped (09/29/16 2204)     LOS: 3 days    Time spent: 25 minutes  Greater than 50% of the time spent on counseling and coordinating the care.   Leisa Lenz, MD [facility] Pager [phone]  If 7PM-7AM, please contact night-coverage [email or web address] Password [facility] 09/30/2016, 6:45 AM

## 2016-10-01 DIAGNOSIS — A419 Sepsis, unspecified organism: Principal | ICD-10-CM

## 2016-10-01 LAB — CBC WITH DIFFERENTIAL/PLATELET
BASOS ABS: 0 10*3/uL (ref 0.0–0.1)
BASOS PCT: 0 %
EOS ABS: 0 10*3/uL (ref 0.0–0.7)
EOS PCT: 0 %
HCT: 38.2 % (ref 36.0–46.0)
Hemoglobin: 12.3 g/dL (ref 12.0–15.0)
Lymphocytes Relative: 12 %
Lymphs Abs: 1.3 10*3/uL (ref 0.7–4.0)
MCH: 27.7 pg (ref 26.0–34.0)
MCHC: 32.2 g/dL (ref 30.0–36.0)
MCV: 86 fL (ref 78.0–100.0)
Monocytes Absolute: 0.1 10*3/uL (ref 0.1–1.0)
Monocytes Relative: 1 %
NEUTROS PCT: 87 %
Neutro Abs: 9.9 10*3/uL — ABNORMAL HIGH (ref 1.7–7.7)
PLATELETS: 306 10*3/uL (ref 150–400)
RBC: 4.44 MIL/uL (ref 3.87–5.11)
RDW: 14.5 % (ref 11.5–15.5)
WBC: 11.3 10*3/uL — AB (ref 4.0–10.5)

## 2016-10-01 LAB — BASIC METABOLIC PANEL
ANION GAP: 7 (ref 5–15)
BUN: 14 mg/dL (ref 6–20)
CO2: 28 mmol/L (ref 22–32)
Calcium: 9.1 mg/dL (ref 8.9–10.3)
Chloride: 103 mmol/L (ref 101–111)
Creatinine, Ser: 0.61 mg/dL (ref 0.44–1.00)
Glucose, Bld: 182 mg/dL — ABNORMAL HIGH (ref 65–99)
POTASSIUM: 4.6 mmol/L (ref 3.5–5.1)
SODIUM: 138 mmol/L (ref 135–145)

## 2016-10-01 LAB — PROTIME-INR
INR: 1.82
PROTHROMBIN TIME: 21.3 s — AB (ref 11.4–15.2)

## 2016-10-01 LAB — PROCALCITONIN

## 2016-10-01 MED ORDER — DEXTROSE 5 % IV SOLN
1.0000 g | Freq: Two times a day (BID) | INTRAVENOUS | Status: DC
  Administered 2016-10-01 – 2016-10-06 (×11): 1 g via INTRAVENOUS
  Filled 2016-10-01 (×12): qty 1

## 2016-10-01 MED ORDER — WARFARIN SODIUM 2.5 MG PO TABS
2.5000 mg | ORAL_TABLET | Freq: Once | ORAL | Status: AC
  Administered 2016-10-01: 2.5 mg via ORAL
  Filled 2016-10-01: qty 1

## 2016-10-01 NOTE — Progress Notes (Signed)
Patient ID: Michelle Mccall, female   DOB: 01-14-1941, 76 y.o.   MRN: [id number]  PROGRESS NOTE    Michelle Mccall  Michelle Mccall:[id number] DOB: [birth year] DOA: 09/27/2016  PCP: No primary care provider on file.   Brief Narrative:  76 year old [place] female with history of dyslipidemia, unspecified tachyarrhythmia, multiple episodes of pneumonia in past 9 months, November and December 2017, April 2018. Patient transferred from [facility] with progressive shortness of breath at rest and on exertion. No fevers or chills. Patient apparently came back to [place] 09/13/2016 after traveling to [place] and apparently has had pneumonia while in [place] and she brought some medications with her but they were not helping relieve the symptoms. In ED, patient was afebrile, heart rate was 128, normal blood pressure and oxygen saturation was 94% on room air. She was started on broad-spectrum antibiotics while awaiting culture results. Chest x-ray showed left lower lobe and lingular pneumonia and calcified right lung granuloma. This was followed by CT chest with contrast which showed extensive consolidations of the left lower and upper lobes and infiltrate on the right middle lobe with a mild mediastinal adenopathy. When patient arrived to stepdown unit at the time of the admission, patient became tachycardic with pulse of 127, she was hypotensive with blood pressure of 79/50 despite at least 1.5 L normal saline bolus. Patient improved with nebulizer treatment and one dose of Solu-Medrol 125 mg IV, BiPAP. Pt had increased work of breathing 8/18. CCM consulted.  Assessment & Plan:   Principal Problem:   Sepsis ([place]) / multilobar pneumonia / leukocytosis / HCAP  - Sepsis criteria met on the admission with source of infection suspected to be pneumonia - Chest x-ray showed extensive left lower lobe and lingular pneumonia - CT chest showed extensive consolidations and groundglass density throughout the left upper and lower  lobes most consistent with pneumonia. There are additional infiltrates present in the right middle lobe and mild subpleural nodularity in the right lower lobe suspicious for additional small focus of infection. Follow-up imaging recommended to ensure resolution. Also mild mediastinal adenopathy seen on CT scan likely is reactive - Stopped vanco 8/18 - Continue cefepime - Influenza PCR is negative - Blood cx negative - Resp cx pending   Active Problems:   Acute respiratory failure with hypoxia - Likely due to HCAP - CXR 8/18 showed mild worsening of left lung aeration when compared to the prior exams. Consolidation has increased in the left upper lobe and persists throughout the remainder of the left lung sparing only the left apex. - CCM has seen the pt in consultation - Started solumedrol 60 mg IV Q 6 hours - Continue current bronchodilator treatment - Continue oxygen support via [place] to keep O2 sats above 90%    Hypokalemia - Due to sepsis - Supplemented     Tachycardia - Likely in the setting of sepsis although patient has history of unspecified tacchyarrhythmia and was apparently on Cardizem and Coumadin - The 12 lead EKG showed sinus tachycardia - Continue coumadin and Lovenox until INR therapeutic and then continue coumadin only    Normocytic anemia - Hgb stable      DVT prophylaxis: Coumadin and lovenox Code Status: full code  Family Communication: family not at the bedside  Disposition Plan: monitor in SDU for next 24 hours    Consultants:   PCCM  PT  Procedures:   None  Antimicrobials:   Cefepime 8/16 -->  Vanco 8/16 --> 8/19   Subjective: No overnight events.  Objective:  Vitals:   10/01/16 0400 10/01/16 0600 10/01/16 0700 10/01/16 0800  BP: (!) 119/93 (!) 95/52    Pulse:      Resp: (!) 33 19 (!) 28 (!) 27  Temp:    97.9 F (36.6 C)  TempSrc:    Oral  SpO2: 97% 100% 100% 99%  Weight:      Height:       No intake or output data in the 24  hours ending 10/01/16 0904 Filed Weights   09/27/16 1841 09/28/16 0057 09/30/16 0421  Weight: 54.2 kg (119 lb 7.8 oz) 53.6 kg (118 lb 2.7 oz) 60.2 kg (132 lb 11.5 oz)    Physical Exam  Constitutional: Appears well-developed and well-nourished. No distress.  CVS: Rate controlled, S1/S2 + Pulmonary: Effort and breath sounds normal, no stridor, rhonchi, wheezes, rales.  Abdominal: Soft. BS +,  no distension, tenderness, rebound or guarding.  Musculoskeletal: Normal range of motion. No edema and no tenderness.  Lymphadenopathy: No lymphadenopathy noted, cervical, inguinal. Neuro: Alert. Normal reflexes. Skin: Skin is warm and dry.   Psychiatric: Normal mood and affect. Behavior, judgment, thought content normal.    Data Reviewed: I have personally reviewed following labs and imaging studies  CBC:  Recent Labs Lab 09/28/16 0115 09/28/16 0238 09/29/16 0249 09/30/16 0259 10/01/16 0421  WBC 10.1 8.9 11.4* 10.8* 11.3*  NEUTROABS 6.0 5.0 7.6 6.8 9.9*  HGB 12.0 11.0* 11.0* 11.4* 12.3  HCT 35.9* 33.4* 33.5* 35.7* 38.2  MCV 83.9 83.9 84.8 86.7 86.0  PLT 284 248 249 287 789   Basic Metabolic Panel:  Recent Labs Lab 09/28/16 0238 09/28/16 0428 09/29/16 0249 09/30/16 0259 10/01/16 0421  NA 137 137 137 137 138  K 3.4* 3.4* 4.3 4.5 4.6  CL 104 104 106 104 103  CO2 '28 27 26 28 28  ' GLUCOSE 113* 114* 129* 104* 182*  BUN '17 15 13 15 14  ' CREATININE 0.61 0.56 0.60 0.53 0.61  CALCIUM 8.2* 8.3* 8.8* 8.6* 9.1  MG 1.7  --   --   --   --   PHOS 3.1  --   --   --   --    GFR: Estimated Creatinine Clearance: 49.1 mL/min (by C-G formula based on SCr of 0.61 mg/dL). Liver Function Tests:  Recent Labs Lab 09/28/16 0115 09/28/16 0238  AST 14* 14*  ALT 9* 9*  ALKPHOS 56 52  BILITOT 0.6 0.4  PROT 6.3* 5.8*  ALBUMIN 2.8* 2.5*   No results for input(s): LIPASE, AMYLASE in the last 168 hours. No results for input(s): AMMONIA in the last 168 hours. Coagulation Profile:  Recent  Labs Lab 09/27/16 1900 09/29/16 0249 09/30/16 0259 10/01/16 0421  INR 0.92 0.93 1.12 1.82   Cardiac Enzymes:  Recent Labs Lab 09/27/16 1900 09/28/16 0428  TROPONINI <0.03 <0.03   BNP (last 3 results) No results for input(s): PROBNP in the last 8760 hours. HbA1C: No results for input(s): HGBA1C in the last 72 hours. CBG: No results for input(s): GLUCAP in the last 168 hours. Lipid Profile: No results for input(s): CHOL, HDL, LDLCALC, TRIG, CHOLHDL, LDLDIRECT in the last 72 hours. Thyroid Function Tests: No results for input(s): TSH, T4TOTAL, FREET4, T3FREE, THYROIDAB in the last 72 hours. Anemia Panel: No results for input(s): VITAMINB12, FOLATE, FERRITIN, TIBC, IRON, RETICCTPCT in the last 72 hours. Urine analysis: No results found for: COLORURINE, APPEARANCEUR, LABSPEC, PHURINE, GLUCOSEU, HGBUR, BILIRUBINUR, KETONESUR, PROTEINUR, UROBILINOGEN, NITRITE, LEUKOCYTESUR Sepsis Labs: '@[email or web address]' (procalcitonin:4,lacticidven:4)    Recent Results (from the  past 240 hour(s))  Blood culture (routine x 2)     Status: None (Preliminary result)   Collection Time: 09/27/16  6:50 PM  Result Value Ref Range Status   Specimen Description BLOOD RIGHT ANTECUBITAL  Final   Special Requests Blood Culture adequate volume IN PEDIATRIC BOTTLE  Final   Culture   Final    NO GROWTH 3 DAYS Performed at [facility], [place] [address] [place], [place] [address]    Report Status PENDING  Incomplete  Blood culture (routine x 2)     Status: None (Preliminary result)   Collection Time: 09/27/16  7:05 PM  Result Value Ref Range Status   Specimen Description BLOOD BLOOD LEFT ARM  Final   Special Requests   Final    Blood Culture adequate volume BOTTLES DRAWN AEROBIC AND ANAEROBIC   Culture   Final    NO GROWTH 3 DAYS Performed at [facility], [place] [address] [place], [place] [address]    Report Status PENDING  Incomplete  MRSA PCR Screening     Status: None   Collection Time:  09/28/16 12:56 AM  Result Value Ref Range Status   MRSA by PCR NEGATIVE NEGATIVE Final    Comment:        The GeneXpert MRSA Assay (FDA approved for NASAL specimens only), is one component of a comprehensive MRSA colonization surveillance program. It is not intended to diagnose MRSA infection nor to guide or monitor treatment for MRSA infections.   Culture, sputum-assessment     Status: None   Collection Time: 09/28/16  4:37 AM  Result Value Ref Range Status   Specimen Description SPUTUM  Final   Special Requests Normal  Final   Sputum evaluation THIS SPECIMEN IS ACCEPTABLE FOR SPUTUM CULTURE  Final   Report Status 09/28/2016 FINAL  Final  Culture, respiratory (NON-Expectorated)     Status: None   Collection Time: 09/28/16  4:37 AM  Result Value Ref Range Status   Specimen Description SPUTUM  Final   Special Requests Normal Reflexed from [id number]  Final   Gram Stain NO WBC SEEN NO ORGANISMS SEEN   Final   Culture   Final    Consistent with normal respiratory flora. Performed at [facility], [place] [address] [place], [place] [address]    Report Status 09/30/2016 FINAL  Final      Radiology Studies: Dg Chest 2 View  Result Date: 09/27/2016 CLINICAL DATA:  Productive cough. EXAM: CHEST  2 VIEW COMPARISON:  None. FINDINGS: Normal cardiac silhouette. There is diffuse airspace disease involving the LEFT lower lobe and lingula. RIGHT lung history of airspace disease. There is a nodule in the RIGHT lung which appears calcified measuring 6 mm. Aggressive osseous lesion IMPRESSION: 1. Extensive LEFT lower lobe and lingular pneumonia. Consider contrast chest CT to exclude central obstructing lesion. 2. Calcified granuloma in the RIGHT lung. Electronically Signed   By: Suzy Bouchard M.D.   On: 09/27/2016 20:40   Ct Chest W Contrast  Result Date: 09/27/2016 CLINICAL DATA:  Pneumonia recurrent cough, shortness of breath EXAM: CT CHEST WITH CONTRAST TECHNIQUE: Multidetector CT  imaging of the chest was performed during intravenous contrast administration. CONTRAST:  136m ISOVUE-300 IOPAMIDOL (ISOVUE-300) INJECTION 61% COMPARISON:  09/27/2016 FINDINGS: Cardiovascular: Non aneurysmal aorta. Minimal atherosclerotic calcifications. Normal heart size. No pericardial effusion. Mediastinum/Nodes: The midline trachea. No thyroid mass. Mild mediastinal adenopathy, largest lymph node is seen in the AP window and measures 1 cm. Esophagus  within normal limits Lungs/Pleura: Tiny left pleural effusion. Extensive consolidations and ground-glass densities in the left lower and upper lobes with peripheral consolidations. Small amount of ground-glass density and consolidation in the right middle lobe. Subpleural nodularity in the right lower lobe. 12 mm right upper lobe pulmonary nodule. Upper Abdomen: No acute abnormality. Musculoskeletal: No chest wall abnormality. No acute or significant osseous findings. IMPRESSION: 1. Extensive consolidations and ground-glass density throughout the left upper and lower lobes, most consistent with pneumonia. Additional infiltrate is present within the right middle lobe. Mild subpleural nodularity in the right lower lobe is suspicious for additional small foci of infection. Small left pleural effusion. Imaging follow-up to resolution is recommended 2. Mild mediastinal adenopathy, likely reactive Aortic Atherosclerosis (ICD10-I70.0). Electronically Signed   By: Donavan Foil M.D.   On: 09/27/2016 22:36   Dg Chest Port 1 View  Result Date: 09/30/2016 CLINICAL DATA:  Congestive productive cough, sob, admitted for sepsis, tachycardia, hx multiple pneumonias since November of last year. EXAM: PORTABLE CHEST 1 VIEW COMPARISON:  Chest CT and chest radiographs, 09/27/2016. FINDINGS: There is consolidation throughout most of the left lung, sparing the left apex. Lung consolidation in the upper lobe on the left has mildly increased from the prior exams. Right lung is  hyperexpanded. Stable granuloma in the inferior right upper lobe. Right lung is otherwise clear. Cardiac silhouette is mostly obscured by the left lung consolidation. Cardiac silhouette is grossly normal in size. No pneumothorax. IMPRESSION: 1. Mild worsening of left lung aeration when compared to the prior exams. Consolidation has increased in the left upper lobe and persists throughout the remainder of the left lung sparing only the left apex. Electronically Signed   By: Lajean Manes M.D.   On: 09/30/2016 08:19       Scheduled Meds: . aspirin  81 mg Oral Daily  . enoxaparin (LOVENOX) injection  60 mg Subcutaneous Q12H  . famotidine  20 mg Oral BID  . ipratropium  0.5 mg Nebulization Q6H  . levalbuterol  1.25 mg Nebulization Q6H  . mouth rinse  15 mL Mouth Rinse BID  . methylPREDNISolone (SOLU-MEDROL) injection  60 mg Intravenous Q6H  . Warfarin - Pharmacist Dosing Inpatient   Does not apply q1800   Continuous Infusions: . 0.9 % NaCl with KCl 20 mEq / L 50 mL/hr at 09/30/16 2149  . ceFEPime (MAXIPIME) IV Stopped (09/30/16 2221)     LOS: 4 days    Time spent: 25 minutes  Greater than 50% of the time spent on counseling and coordinating the care.   Leisa Lenz, MD [facility] Pager [phone]  If 7PM-7AM, please contact night-coverage [email or web address] Password [email or web address] 10/01/2016, 9:04 AM

## 2016-10-01 NOTE — Progress Notes (Signed)
ANTICOAGULATION CONSULT NOTE / Antibiotic Note  Pharmacy Consult for Lovenox & Coumadin, Cefepime Indication: unspecified tachycardia, Pneumonia  No Known Allergies  Patient Measurements: Height: 5\' 1"  (154.9 cm) Weight: 132 lb 11.5 oz (60.2 kg) IBW/kg (Calculated) : 47.8  Vital Signs: Temp: 97.9 F (36.6 C) (08/19 0800) Temp Source: Oral (08/19 0800) BP: 95/52 (08/19 0600)  Labs:  Recent Labs  09/29/16 0249 09/30/16 0259 10/01/16 0421  HGB 11.0* 11.4* 12.3  HCT 33.5* 35.7* 38.2  PLT 249 287 306  LABPROT 12.4 14.5 21.3*  INR 0.93 1.12 1.82  CREATININE 0.60 0.53 0.61    Estimated Creatinine Clearance: 49.1 mL/min (by C-G formula based on SCr of 0.61 mg/dL).   Medical History: Past Medical History:  Diagnosis Date  . Hyperlipidemia   . Pneumonia   . Tachyarrhythmia     Medications:  Scheduled:  . aspirin  81 mg Oral Daily  . enoxaparin (LOVENOX) injection  60 mg Subcutaneous Q12H  . famotidine  20 mg Oral BID  . ipratropium  0.5 mg Nebulization Q6H  . levalbuterol  1.25 mg Nebulization Q6H  . mouth rinse  15 mL Mouth Rinse BID  . methylPREDNISolone (SOLU-MEDROL) injection  60 mg Intravenous Q6H  . Warfarin - Pharmacist Dosing Inpatient   Does not apply q1800   Infusions:  . 0.9 % NaCl with KCl 20 mEq / L 50 mL/hr at 09/30/16 2149  . ceFEPime (MAXIPIME) IV     PRN: ipratropium, levalbuterol, metoprolol tartrate, ondansetron (ZOFRAN) IV   Antimicrobials this admission:  Vancomycin 09/27/2016 >> 8/18 Cefepime 09/27/2016 >>   Dose adjustments this admission:  8/19 Increase cefepime from 1g IV q24h to 1g IV q12h  Microbiology results:  8/15 BCx: ngtd 8/16 Urine strep: neg 8/16 Urine legionella: neg 8/16 Sputum: no organisms seen, cx c/w normal flora (F)  8/16 MRSA PCR: neg 8/17 Influenza panel: neg   Assessment: 76 yo female admitted with pneumonia. Her daughter reports that she is taking warfarin 5mg  daily for an abnormal heart rhythm but has not  taken it in the past week. She was unable to say what physician monitors the INR.  INR is subtherapeutic at 0.92. Pharmacy is consulted to resume dosing now.  10/01/2016:  INR = 1.82, increased after 3 doses of warfarin 7.5mg   CBC: Hg improved 12.3, Pltc WNL  No bleeding reported  Regular diet ordered  Drug-drug interactions- no major DDI.  Broad-spectrum antibiotics and increase INR.   SCr 0.61, stable. CrCl 49 ml/min  Goal of Therapy:  INR 2-3 Monitor platelets by anticoagulation protocol: Yes  Plan:   Decrease Warfarin 2.5mg  PO x 1 today  Daily PT/INR  Lovenox 60mg  sq q12h until INR therapeutic  Consider stopping aspirin with concurrent warfarin therapy  Increase cefepime to 1g IV q12h   Peggyann Juba, PharmD, BCPS Pager: [phone] 10/01/2016,10:29 AM

## 2016-10-01 NOTE — Progress Notes (Signed)
PULMONARY / CRITICAL CARE MEDICINE   Name: Michelle Mccall MRN: [id number] DOB: 1940/12/02    ADMISSION DATE:  09/27/2016 CONSULTATION DATE:    Loistine Chance MD:  Neil Crouch MD  CHIEF COMPLAINT:  Sepsis, pneumonia  HISTORY OF PRESENT ILLNESS:   76 year old with history of hyperlipidemia, recurrent admissions for pneumonia, tachyarrhythmia admitted with worsening dyspnea. PCCM consulted on 8/16. We were called back on 8/18 for worsening respiratory status with rhonchi  PAST MEDICAL HISTORY :  She  has a past medical history of Hyperlipidemia; Pneumonia; and Tachyarrhythmia.  PAST SURGICAL HISTORY: She  has a past surgical history that includes Back surgery.  No Known Allergies  No current facility-administered medications on file prior to encounter.    No current outpatient prescriptions on file prior to encounter.    FAMILY HISTORY:  Her indicated that the status of her other is unknown.    SOCIAL HISTORY: She  reports that she has never smoked. She has never used smokeless tobacco. She reports that she does not drink alcohol or use drugs.  REVIEW OF SYSTEMS:   Unable to obtain due to language barrier  SUBJECTIVE:  Looks slightly better.   VITAL SIGNS: BP 131/85   Pulse (!) 156   Temp 97.9 F (36.6 C) (Oral)   Resp (!) 25   Ht 5\' 1"  (1.549 m)   Wt 60.2 kg (132 lb 11.5 oz)   SpO2 99%   BMI 25.08 kg/m   HEMODYNAMICS:    VENTILATOR SETTINGS:    INTAKE / OUTPUT: I/O last 3 completed shifts: In: 912.5 [P.O.:240; I.V.:672.5] Out: 300 [Urine:300]  PHYSICAL EXAMINATION: Blood pressure 131/85, pulse (!) 156, temperature 97.9 F (36.6 C), temperature source Oral, resp. rate (!) 25, height 5\' 1"  (1.549 m), weight 60.2 kg (132 lb 11.5 oz), SpO2 99 %. Gen:      No acute distress HEENT:  EOMI, sclera anicteric Neck:     No masses; no thyromegaly Lungs:   B/L crackles; normal respiratory effort CV:         Regular rate and rhythm; no murmurs Abd:      + bowel  sounds; soft, non-tender; no palpable masses, no distension Ext:    No edema; adequate peripheral perfusion Skin:      Warm and dry; no rash Neuro: alert and oriented x 3 Psych: normal mood and affect  LABS:  BMET  Recent Labs Lab 09/29/16 0249 09/30/16 0259 10/01/16 0421  NA 137 137 138  K 4.3 4.5 4.6  CL 106 104 103  CO2 26 28 28   BUN 13 15 14   CREATININE 0.60 0.53 0.61  GLUCOSE 129* 104* 182*    Electrolytes  Recent Labs Lab 09/28/16 0238  09/29/16 0249 09/30/16 0259 10/01/16 0421  CALCIUM 8.2*  < > 8.8* 8.6* 9.1  MG 1.7  --   --   --   --   PHOS 3.1  --   --   --   --   < > = values in this interval not displayed.  CBC  Recent Labs Lab 09/29/16 0249 09/30/16 0259 10/01/16 0421  WBC 11.4* 10.8* 11.3*  HGB 11.0* 11.4* 12.3  HCT 33.5* 35.7* 38.2  PLT 249 287 306    Coag's  Recent Labs Lab 09/29/16 0249 09/30/16 0259 10/01/16 0421  INR 0.93 1.12 1.82    Sepsis Markers  Recent Labs Lab 09/27/16 1911 09/28/16 0115 09/28/16 0428 10/01/16 0421  LATICACIDVEN 1.72 1.3 1.0  --   PROCALCITON  --  <  0.10  --  <0.10    ABG  Recent Labs Lab 09/30/16 0749  PHART 7.404  PCO2ART 48.0  PO2ART 80.7*    Liver Enzymes  Recent Labs Lab 09/28/16 0115 09/28/16 0238  AST 14* 14*  ALT 9* 9*  ALKPHOS 56 52  BILITOT 0.6 0.4  ALBUMIN 2.8* 2.5*    Cardiac Enzymes  Recent Labs Lab 09/27/16 1900 09/28/16 0428  TROPONINI <0.03 <0.03    Glucose No results for input(s): GLUCAP in the last 168 hours.  Imaging No results found.  STUDIES:    CULTURES:   ANTIBIOTICS: Vanco 8/16 > 8/18 Cefepime 8/16 >  SIGNIFICANT EVENTS: 8/16> admit with multilobar PNA  LINES/TUBES:  DISCUSSION: 76 year old with recurrent pneumonia. Admitted with acute hypoxic respiratory failure, multilobar infiltrates. Pct noted to be low Needs eval for alternate causes of infiltrate such as ILD,  ASSESSMENT / PLAN:  PULMONARY A: Acute hypoxic resp  failure Multi lobar lt infiltrates P:   Continue abx Follow cultures Pct Continue xopenex and atrovent standing nebs Started on solumedrol as she is wheezing.  Order ANA, ANCA, RF and CCP Repeat CXR tomorrow  CARDIOVASCULAR A:  Tachycardia.  Echo noted with RV dilation and pulm HTN. ? ASD P:  Continue anticoagulation Repeat echo with bubble when she is more stable. Lopressor PRN  RENAL A:   Stable  P:   Monitor urine output and Cr  GASTROINTESTINAL A:   Stable P:   PO diet  HEMATOLOGIC A:   Mild leukocytosis P:  Monitior CBC  INFECTIOUS A:   Pneumonia HIV negative P:   Continue abx.  Follow urine output and cr = FAMILY  - Updates: No family at bedside - Inter-disciplinary family meet or Palliative Care meeting due by:  10/07/16  The patient is critically ill with multiple organ system failure and requires high complexity decision making for assessment and support, frequent evaluation and titration of therapies, advanced monitoring, review of radiographic studies and interpretation of complex data.   Critical Care Time devoted to patient care services, exclusive of separately billable procedures, described in this note is 35 minutes.   Marshell Garfinkel MD [facility] Pager [phone] If no answer or after 3pm call: [phone] 10/01/2016, 1:04 PM

## 2016-10-02 DIAGNOSIS — J189 Pneumonia, unspecified organism: Secondary | ICD-10-CM

## 2016-10-02 DIAGNOSIS — J9601 Acute respiratory failure with hypoxia: Secondary | ICD-10-CM

## 2016-10-02 LAB — CBC
HCT: 33.2 % — ABNORMAL LOW (ref 36.0–46.0)
HEMOGLOBIN: 10.6 g/dL — AB (ref 12.0–15.0)
MCH: 27.7 pg (ref 26.0–34.0)
MCHC: 31.9 g/dL (ref 30.0–36.0)
MCV: 86.7 fL (ref 78.0–100.0)
Platelets: 255 10*3/uL (ref 150–400)
RBC: 3.83 MIL/uL — AB (ref 3.87–5.11)
RDW: 14.6 % (ref 11.5–15.5)
WBC: 11.5 10*3/uL — ABNORMAL HIGH (ref 4.0–10.5)

## 2016-10-02 LAB — PROTIME-INR
INR: 2.61
Prothrombin Time: 28.4 seconds — ABNORMAL HIGH (ref 11.4–15.2)

## 2016-10-02 LAB — ANCA TITERS
Atypical P-ANCA titer: <1:20 {titer}
C-ANCA: <1:20 {titer}
P-ANCA: <1:20 {titer}

## 2016-10-02 LAB — CULTURE, BLOOD (ROUTINE X 2)
CULTURE: NO GROWTH
Culture: NO GROWTH
SPECIAL REQUESTS: ADEQUATE
Special Requests: ADEQUATE

## 2016-10-02 LAB — ANTINUCLEAR ANTIBODIES, IFA: ANTINUCLEAR ANTIBODIES, IFA: NEGATIVE

## 2016-10-02 LAB — BASIC METABOLIC PANEL
ANION GAP: 7 (ref 5–15)
BUN: 15 mg/dL (ref 6–20)
CHLORIDE: 103 mmol/L (ref 101–111)
CO2: 25 mmol/L (ref 22–32)
Calcium: 9 mg/dL (ref 8.9–10.3)
Creatinine, Ser: 0.5 mg/dL (ref 0.44–1.00)
GFR calc non Af Amer: >60 mL/min (ref >60)
Glucose, Bld: 206 mg/dL — ABNORMAL HIGH (ref 65–99)
Potassium: 5.2 mmol/L — ABNORMAL HIGH (ref 3.5–5.1)
Sodium: 135 mmol/L (ref 135–145)

## 2016-10-02 LAB — PROCALCITONIN

## 2016-10-02 LAB — CYCLIC CITRUL PEPTIDE ANTIBODY, IGG/IGA: CCP Antibodies IgG/IgA: 8 units (ref 0–19)

## 2016-10-02 LAB — RHEUMATOID FACTOR: Rhuematoid fact SerPl-aCnc: 10.6 IU/mL (ref 0.0–13.9)

## 2016-10-02 MED ORDER — GUAIFENESIN ER 600 MG PO TB12
1200.0000 mg | ORAL_TABLET | Freq: Two times a day (BID) | ORAL | Status: DC
  Administered 2016-10-02 – 2016-10-04 (×5): 1200 mg via ORAL
  Filled 2016-10-02 (×5): qty 2

## 2016-10-02 MED ORDER — WARFARIN SODIUM 1 MG PO TABS
1.0000 mg | ORAL_TABLET | Freq: Once | ORAL | Status: AC
  Administered 2016-10-02: 1 mg via ORAL
  Filled 2016-10-02: qty 1

## 2016-10-02 MED ORDER — SODIUM CHLORIDE 0.9 % IV SOLN
INTRAVENOUS | Status: DC
  Administered 2016-10-02 – 2016-10-04 (×2): via INTRAVENOUS

## 2016-10-02 NOTE — Progress Notes (Signed)
ANTICOAGULATION CONSULT NOTE / Antibiotic Note  Pharmacy Consult for Lovenox & Coumadin  Indication: unspecified tachycardia   No Known Allergies  Patient Measurements: Height: 5\' 1"  (154.9 cm) Weight: 132 lb 11.5 oz (60.2 kg) IBW/kg (Calculated) : 47.8  Vital Signs: Temp: 98 F (36.7 C) (08/20 0804) Temp Source: Oral (08/20 0334) BP: 110/64 (08/20 0600)  Labs:  Recent Labs  09/30/16 0259 10/01/16 0421 10/02/16 0329  HGB 11.4* 12.3 10.6*  HCT 35.7* 38.2 33.2*  PLT 287 306 255  LABPROT 14.5 21.3* 28.4*  INR 1.12 1.82 2.61  CREATININE 0.53 0.61 0.50    Estimated Creatinine Clearance: 49.1 mL/min (by C-G formula based on SCr of 0.5 mg/dL).   Medical History: Past Medical History:  Diagnosis Date  . Hyperlipidemia   . Pneumonia   . Tachyarrhythmia     Medications:  Scheduled:  . aspirin  81 mg Oral Daily  . famotidine  20 mg Oral BID  . ipratropium  0.5 mg Nebulization Q6H  . levalbuterol  1.25 mg Nebulization Q6H  . mouth rinse  15 mL Mouth Rinse BID  . methylPREDNISolone (SOLU-MEDROL) injection  60 mg Intravenous Q6H  . Warfarin - Pharmacist Dosing Inpatient   Does not apply q1800   Infusions:  . sodium chloride    . ceFEPime (MAXIPIME) IV Stopped (10/02/16 0044)   PRN: ipratropium, levalbuterol, metoprolol tartrate, ondansetron (ZOFRAN) IV   Assessment: 76 yo female admitted with pneumonia. Her daughter reports that she is taking warfarin 5mg  daily for an abnormal heart rhythm but has not taken it in the past week. She was unable to say what physician monitors the INR.  INR is subtherapeutic at 0.92. Pharmacy is consulted to resume dosing now.  10/02/2016:  INR = 2.61, increased after 3 doses of warfarin 7.5mg  and one dose of 2.5 mg   CBC: Hg low at 10.6 but stable, Pltc WNL  No bleeding reported  Regular diet ordered  Drug-drug interactions- no major DDI.  Broad-spectrum antibiotics can increase INR.   SCr 0.50, stable. CrCl 49 ml/min  Goal  of Therapy:  INR 2-3 Monitor platelets by anticoagulation protocol: Yes  Plan:   Decrease Warfarin 1 mg PO x 1 today  Daily PT/INR  As INR is now therapeutic, stop enoxaparin   Consider stopping aspirin with concurrent warfarin therapy   Royetta Asal, PharmD, BCPS Pager [phone] 10/02/2016 8:43 AM

## 2016-10-02 NOTE — Care Management Note (Signed)
Case Management Note  Patient Details  Name: Michelle Mccall MRN: [id number] Date of Birth: 1940-09-11  Subjective/Objective:     Requiring HFNC for respiratory failure and PNA               Action/Plan: Date:  October 02, 2016 Chart reviewed for concurrent status and case management needs. Will continue to follow patient progress. Discharge Planning: following for needs Expected discharge date: [id number] [name], BSN, [place], [place]  Expected Discharge Date:                  Expected Discharge Plan:  Home/Self Care  In-House Referral:     Discharge planning Services  CM Consult  Post Acute Care Choice:    Choice offered to:     DME Arranged:    DME Agency:     HH Arranged:    HH Agency:     Status of Service:  In process, will continue to follow  If discussed at Long Length of Stay Meetings, dates discussed:    Additional Comments:  Leeroy Cha, RN 10/02/2016, 8:55 AM

## 2016-10-02 NOTE — Progress Notes (Addendum)
Patient ID: Michelle Mccall, female   DOB: 05/21/1940, 76 y.o.   MRN: [id number]  PROGRESS NOTE    Michelle Mccall  LYY:[id number] DOB: 11/02/1940 DOA: 09/27/2016  PCP: No primary care provider on file.   Brief Narrative:  76 year old [place] female with history of dyslipidemia, unspecified tachyarrhythmia, multiple episodes of pneumonia in past 9 months, November and December 2017, April 2018. Patient transferred from [facility] with progressive shortness of breath at rest and on exertion. No fevers or chills. Patient apparently came back to [place] 09/13/2016 after traveling to [place] and apparently has had pneumonia while in [place] and she brought some medications with her but they were not helping relieve the symptoms. In ED, patient was afebrile, heart rate was 128, normal blood pressure and oxygen saturation was 94% on room air. She was started on broad-spectrum antibiotics while awaiting culture results. Chest x-ray showed left lower lobe and lingular pneumonia and calcified right lung granuloma. This was followed by CT chest with contrast which showed extensive consolidations of the left lower and upper lobes and infiltrate on the right middle lobe with a mild mediastinal adenopathy. When patient arrived to stepdown unit at the time of the admission, patient became tachycardic with pulse of 127, she was hypotensive with blood pressure of 79/50 despite at least 1.5 L normal saline bolus. Patient improved with nebulizer treatment and one dose of Solu-Medrol 125 mg IV, BiPAP. Pt had increased work of breathing 8/18. CCM consulted.   Assessment & Plan:   Principal Problem:   Sepsis ([place]) / multilobar pneumonia / leukocytosis / HCAP  - Sepsis criteria met on the admission with source of infection suspected to be pneumonia - CXR 8/18 showed mild worsening of left lung aeration, increased consolidation in the left upper lung lobe  - CT chest showed extensive consolidations and groundglass  density throughout the left upper and lower lobes most consistent with pneumonia. There are additional infiltrates present in the right middle lobe and mild subpleural nodularity in the right lower lobe suspicious for additional small focus of infection. Follow-up imaging recommended to ensure resolution. Also mild mediastinal adenopathy seen on CT scan likely is reactive - Stopped vanco 8/18 - Influenza PCR negative - Blood and reps cx negative - Continue cefepime    Active Problems:   Acute respiratory failure with hypoxia - Likely due to HCAP - CXR 8/18 showed mild worsening of left lung aeration when compared to the prior exams. Consolidation has increased in the left upper lobe and persists throughout the remainder of the left lung sparing only the left apex. - CCM stopped solumedrol  - Continue current BD's - Continue oxygen support via [place] to keep O2 sats above 90%    Hypokalemia / Hyperkalemia  - Due to sepsis - Now slight hyperkalemia due to supplementation - Remove supplementation - Check BMP in am    Tachycardia - Likely in the setting of sepsis although patient has history of unspecified tacchyarrhythmia and was apparently on Cardizem and Coumadin - The 12 lead EKG showed sinus tachycardia - Continue coumadin since now INR therapeutic     Normocytic anemia - Hgb 10.6 this am, slightly down from 12.3 and 11.4 over past 48 hours - Will continue to monitor    DVT prophylaxis: Couamdin Code Status: full code  Family Communication: called daughter, left VM to call me back for updates  Disposition Plan: remains in SDU   Consultants:   PCCM  PT  Procedures:   None  Antimicrobials:  Cefepime 8/16 -->  Vanco 8/16 --> 8/19   Subjective: No overnight events.  Objective: Vitals:   10/02/16 0400 10/02/16 0600 10/02/16 0803 10/02/16 0804  BP: (!) 112/57 110/64    Pulse:      Resp: 20 18    Temp:    98 F (36.7 C)  TempSrc:      SpO2: (!) 89% 91% 96%    Weight:      Height:        Intake/Output Summary (Last 24 hours) at 10/02/16 1107 Last data filed at 10/02/16 0900  Gross per 24 hour  Intake          2860.83 ml  Output              500 ml  Net          2360.83 ml   Filed Weights   09/27/16 1841 09/28/16 0057 09/30/16 0421  Weight: 54.2 kg (119 lb 7.8 oz) 53.6 kg (118 lb 2.7 oz) 60.2 kg (132 lb 11.5 oz)    Examination:  General exam: Appears calm and comfortable  Respiratory system: coarse breath sounds, no wheezing  Cardiovascular system: S1 & S2 heard, Rate controlled  Gastrointestinal system: Abdomen is nondistended, soft and nontender. No organomegaly or masses felt. Normal bowel sounds heard. Central nervous system: Alert and oriented. No focal neurological deficits. Extremities: Symmetric 5 x 5 power. Skin: No rashes, lesions or ulcers Psychiatry: Judgement and insight appear normal. Mood & affect appropriate.   Data Reviewed: I have personally reviewed following labs and imaging studies  CBC:  Recent Labs Lab 09/28/16 0115 09/28/16 0238 09/29/16 0249 09/30/16 0259 10/01/16 0421 10/02/16 0329  WBC 10.1 8.9 11.4* 10.8* 11.3* 11.5*  NEUTROABS 6.0 5.0 7.6 6.8 9.9*  --   HGB 12.0 11.0* 11.0* 11.4* 12.3 10.6*  HCT 35.9* 33.4* 33.5* 35.7* 38.2 33.2*  MCV 83.9 83.9 84.8 86.7 86.0 86.7  PLT 284 248 249 287 306 585   Basic Metabolic Panel:  Recent Labs Lab 09/28/16 0238 09/28/16 0428 09/29/16 0249 09/30/16 0259 10/01/16 0421 10/02/16 0329  NA 137 137 137 137 138 135  K 3.4* 3.4* 4.3 4.5 4.6 5.2*  CL 104 104 106 104 103 103  CO2 _0 GLUCOSE 113* 114* 129* 104* 182* 206*  BUN _1 CREATININE 0.61 0.56 0.60 0.53 0.61 0.50  CALCIUM 8.2* 8.3* 8.8* 8.6* 9.1 9.0  MG 1.7  --   --   --   --   --   PHOS 3.1  --   --   --   --   --    GFR: Estimated Creatinine Clearance: 49.1 mL/min (by C-G formula based on SCr of 0.5 mg/dL). Liver Function Tests:  Recent Labs Lab  09/28/16 0115 09/28/16 0238  AST 14* 14*  ALT 9* 9*  ALKPHOS 56 52  BILITOT 0.6 0.4  PROT 6.3* 5.8*  ALBUMIN 2.8* 2.5*   No results for input(s): LIPASE, AMYLASE in the last 168 hours. No results for input(s): AMMONIA in the last 168 hours. Coagulation Profile:  Recent Labs Lab 09/27/16 1900 09/29/16 0249 09/30/16 0259 10/01/16 0421 10/02/16 0329  INR 0.92 0.93 1.12 1.82 2.61   Cardiac Enzymes:  Recent Labs Lab 09/27/16 1900 09/28/16 0428  TROPONINI <0.03 <0.03   BNP (last 3 results) No results for input(s): PROBNP in the last 8760 hours. HbA1C: No results for input(s): HGBA1C in the last 72 hours.  CBG: No results for input(s): GLUCAP in the last 168 hours. Lipid Profile: No results for input(s): CHOL, HDL, LDLCALC, TRIG, CHOLHDL, LDLDIRECT in the last 72 hours. Thyroid Function Tests: No results for input(s): TSH, T4TOTAL, FREET4, T3FREE, THYROIDAB in the last 72 hours. Anemia Panel: No results for input(s): VITAMINB12, FOLATE, FERRITIN, TIBC, IRON, RETICCTPCT in the last 72 hours. Urine analysis: No results found for: COLORURINE, APPEARANCEUR, [place], [place], GLUCOSEU, HGBUR, BILIRUBINUR, KETONESUR, PROTEINUR, UROBILINOGEN, NITRITE, LEUKOCYTESUR Sepsis Labs: _0 (procalcitonin:4,lacticidven:4)   ) Recent Results (from the past 240 hour(s))  Blood culture (routine x 2)     Status: None (Preliminary result)   Collection Time: 09/27/16  6:50 PM  Result Value Ref Range Status   Specimen Description BLOOD RIGHT ANTECUBITAL  Final   Special Requests Blood Culture adequate volume IN PEDIATRIC BOTTLE  Final   Culture   Final    NO GROWTH 4 DAYS Performed at [facility], [place] [address], [place], [place] [address]    Report Status PENDING  Incomplete  Blood culture (routine x 2)     Status: None (Preliminary result)   Collection Time: 09/27/16  7:05 PM  Result Value Ref Range Status   Specimen Description BLOOD BLOOD LEFT ARM  Final   Special  Requests   Final    Blood Culture adequate volume BOTTLES DRAWN AEROBIC AND ANAEROBIC   Culture   Final    NO GROWTH 4 DAYS Performed at [facility], [place] [address], [place], [place] [address]    Report Status PENDING  Incomplete  MRSA PCR Screening     Status: None   Collection Time: 09/28/16 12:56 AM  Result Value Ref Range Status   MRSA by PCR NEGATIVE NEGATIVE Final    Comment:        The GeneXpert MRSA Assay (FDA approved for NASAL specimens only), is one component of a comprehensive MRSA colonization surveillance program. It is not intended to diagnose MRSA infection nor to guide or monitor treatment for MRSA infections.   Culture, sputum-assessment     Status: None   Collection Time: 09/28/16  4:37 AM  Result Value Ref Range Status   Specimen Description SPUTUM  Final   Special Requests Normal  Final   Sputum evaluation THIS SPECIMEN IS ACCEPTABLE FOR SPUTUM CULTURE  Final   Report Status 09/28/2016 FINAL  Final  Culture, respiratory (NON-Expectorated)     Status: None   Collection Time: 09/28/16  4:37 AM  Result Value Ref Range Status   Specimen Description SPUTUM  Final   Special Requests Normal Reflexed from [id number]  Final   Gram Stain NO WBC SEEN NO ORGANISMS SEEN   Final   Culture   Final    Consistent with normal respiratory flora. Performed at [facility], [place] [address] [place], [place] [address]    Report Status 09/30/2016 FINAL  Final      Radiology Studies: Dg Chest Port 1 View  Result Date: 09/30/2016 CLINICAL DATA:  Congestive productive cough, sob, admitted for sepsis, tachycardia, hx multiple pneumonias since November of last year. EXAM: PORTABLE CHEST 1 VIEW COMPARISON:  Chest CT and chest radiographs, 09/27/2016. FINDINGS: There is consolidation throughout most of the left lung, sparing the left apex. Lung consolidation in the upper lobe on the left has mildly increased from the prior exams. Right lung is hyperexpanded. Stable  granuloma in the inferior right upper lobe. Right lung is otherwise clear. Cardiac silhouette is mostly obscured by the  left lung consolidation. Cardiac silhouette is grossly normal in size. No pneumothorax. IMPRESSION: 1. Mild worsening of left lung aeration when compared to the prior exams. Consolidation has increased in the left upper lobe and persists throughout the remainder of the left lung sparing only the left apex. Electronically Signed   By: Lajean Manes M.D.   On: 09/30/2016 08:19        Scheduled Meds: . aspirin  81 mg Oral Daily  . famotidine  20 mg Oral BID  . guaiFENesin  1,200 mg Oral BID  . ipratropium  0.5 mg Nebulization Q6H  . levalbuterol  1.25 mg Nebulization Q6H  . mouth rinse  15 mL Mouth Rinse BID  . warfarin  1 mg Oral ONCE-1800  . Warfarin - Pharmacist Dosing Inpatient   Does not apply q1800   Continuous Infusions: . sodium chloride 50 mL/hr at 10/02/16 0847  . ceFEPime (MAXIPIME) IV Stopped (10/02/16 0044)     LOS: 5 days    Time spent: 25 minutes  Greater than 50% of the time spent on counseling and coordinating the care.   Leisa Lenz, MD [facility] Pager [phone]  If 7PM-7AM, please contact night-coverage [email or web address] Password [email or web address] 10/02/2016, 11:07 AM

## 2016-10-02 NOTE — Progress Notes (Signed)
PULMONARY / CRITICAL CARE MEDICINE   Name: Michelle Mccall MRN: [id number] DOB: March 17, 1940    ADMISSION DATE:  09/27/2016 CONSULTATION DATE: 09/28/2016  REFERRING MD:  Neil Crouch MD  CHIEF COMPLAINT:  Sepsis, pneumonia  HISTORY OF PRESENT ILLNESS:   76 yo female with recurrent episodes of pneumonia.  SUBJECTIVE:  She is concerned about her heart rate getting too fast.  Still has cough with chest congestion and clear to yellow sputum.  Reports having trouble with feeling bloated after eating, and then she notices more cough.    Interview conducted with assistance of [place] interpreter.  VITAL SIGNS: BP 110/64 (BP Location: Right Arm)   Pulse (!) 101   Temp 98 F (36.7 C)   Resp 18   Ht 5\' 1"  (1.549 m)   Wt 132 lb 11.5 oz (60.2 kg)   SpO2 96%   BMI 25.08 kg/m   INTAKE / OUTPUT: I/O last 3 completed shifts: In: 2550 [I.V.:2450; IV Piggyback:100] Out: 350 [Urine:350]  PHYSICAL EXAMINATION:  General - pleasant Eyes - pupils reactive ENT - no sinus tenderness, no oral exudate, no LAN Cardiac - regular, no murmur Chest - crackles on Lt Abd - soft, non tender Ext - no edema Skin - no rashes Neuro - normal strength Psych - normal mood  LABS:  BMET  Recent Labs Lab 09/30/16 0259 10/01/16 0421 10/02/16 0329  NA 137 138 135  K 4.5 4.6 5.2*  CL 104 103 103  CO2 28 28 25   BUN 15 14 15   CREATININE 0.53 0.61 0.50  GLUCOSE 104* 182* 206*    Electrolytes  Recent Labs Lab 09/28/16 0238  09/30/16 0259 10/01/16 0421 10/02/16 0329  CALCIUM 8.2*  < > 8.6* 9.1 9.0  MG 1.7  --   --   --   --   PHOS 3.1  --   --   --   --   < > = values in this interval not displayed.  CBC  Recent Labs Lab 09/30/16 0259 10/01/16 0421 10/02/16 0329  WBC 10.8* 11.3* 11.5*  HGB 11.4* 12.3 10.6*  HCT 35.7* 38.2 33.2*  PLT 287 306 255    Coag's  Recent Labs Lab 09/30/16 0259 10/01/16 0421 10/02/16 0329  INR 1.12 1.82 2.61    Sepsis Markers  Recent Labs Lab  09/27/16 1911 09/28/16 0115 09/28/16 0428 10/01/16 0421 10/02/16 0329  LATICACIDVEN 1.72 1.3 1.0  --   --   PROCALCITON  --  <0.10  --  <0.10 <0.10    ABG  Recent Labs Lab 09/30/16 0749  PHART 7.404  PCO2ART 48.0  PO2ART 80.7*    Liver Enzymes  Recent Labs Lab 09/28/16 0115 09/28/16 0238  AST 14* 14*  ALT 9* 9*  ALKPHOS 56 52  BILITOT 0.6 0.4  ALBUMIN 2.8* 2.5*    Cardiac Enzymes  Recent Labs Lab 09/27/16 1900 09/28/16 0428  TROPONINI <0.03 <0.03    Glucose No results for input(s): GLUCAP in the last 168 hours.  Imaging No results found.  STUDIES:  CT chest 8/15 >> extensive consolidation on Lt, 12 mm RUL nodule Echo 8/16 >> EF 60 to 65%, grade 1 DD, severe RV dilation, mod TR, PAS 58 mmHg  CULTURES: Blood 8/15 >>  Sputum 8/16 >> oral flora  ANTIBIOTICS: Vanco 8/16 > 8/18 Cefepime 8/16 >  SIGNIFICANT EVENTS: 8/16 admit with multilobar PNA  ASSESSMENT / PLAN:  Acute hypoxic respiratory failure 2nd to recurrent pneumonia. - I am more concerned about recurrent aspiration -  will get speech therapy to assess swallowing - doubt ILD >> d/c steroids; f/u serology from 8/19 - day 5 of abx - f/u CXR - bronchial hygiene - oxygen to keep SpO2 > 92% - continue BDs  Acute cor pulmonale. - she had normal Echo report from 06/03/16 at [facility] - suspect related to acute hypoxia from pneumonia - consider echo with bubble study when more stable  Intermittent tachycardia. - monitor hemodynamics - prn lopressor - coumadin per primary team  Time spent 36 minutes  Chesley Mires, MD [place] 10/02/2016, 10:58 AM Pager:  [phone] After 3pm call: [phone]

## 2016-10-02 NOTE — Progress Notes (Signed)
PT Cancellation Note  Patient Details Name: Michelle Mccall MRN: [id number] DOB: Nov 07, 1940   Cancelled Treatment:    Reason Eval/Treat Not Completed: Medical issues which prohibited therapy .  Requiring HFNC . Increased respiratory distress. Will check back another time.   Claretha Cooper 10/02/2016, 8:00 AM Tresa Endo PT [phone]

## 2016-10-03 ENCOUNTER — Inpatient Hospital Stay (HOSPITAL_COMMUNITY): Payer: Medicaid Other

## 2016-10-03 LAB — BASIC METABOLIC PANEL
ANION GAP: 4 — AB (ref 5–15)
BUN: 17 mg/dL (ref 6–20)
CO2: 31 mmol/L (ref 22–32)
Calcium: 9.1 mg/dL (ref 8.9–10.3)
Chloride: 103 mmol/L (ref 101–111)
Creatinine, Ser: 0.36 mg/dL — ABNORMAL LOW (ref 0.44–1.00)
GFR calc Af Amer: >60 mL/min (ref >60)
GLUCOSE: 133 mg/dL — AB (ref 65–99)
POTASSIUM: 4.8 mmol/L (ref 3.5–5.1)
Sodium: 138 mmol/L (ref 135–145)

## 2016-10-03 LAB — CBC
HEMATOCRIT: 33.4 % — AB (ref 36.0–46.0)
Hemoglobin: 10.7 g/dL — ABNORMAL LOW (ref 12.0–15.0)
MCH: 27.9 pg (ref 26.0–34.0)
MCHC: 32 g/dL (ref 30.0–36.0)
MCV: 87 fL (ref 78.0–100.0)
PLATELETS: 273 10*3/uL (ref 150–400)
RBC: 3.84 MIL/uL — AB (ref 3.87–5.11)
RDW: 14.7 % (ref 11.5–15.5)
WBC: 11.5 10*3/uL — AB (ref 4.0–10.5)

## 2016-10-03 LAB — PROTIME-INR
INR: 2.23
Prothrombin Time: 25.1 seconds — ABNORMAL HIGH (ref 11.4–15.2)

## 2016-10-03 MED ORDER — RISAQUAD PO CAPS
1.0000 | ORAL_CAPSULE | Freq: Every day | ORAL | Status: DC
  Administered 2016-10-03 – 2016-10-16 (×14): 1 via ORAL
  Filled 2016-10-03 (×14): qty 1

## 2016-10-03 MED ORDER — FUROSEMIDE 40 MG PO TABS
40.0000 mg | ORAL_TABLET | Freq: Once | ORAL | Status: AC
  Administered 2016-10-03: 40 mg via ORAL
  Filled 2016-10-03: qty 1

## 2016-10-03 MED ORDER — WARFARIN SODIUM 2.5 MG PO TABS
2.5000 mg | ORAL_TABLET | Freq: Once | ORAL | Status: AC
  Administered 2016-10-03: 2.5 mg via ORAL
  Filled 2016-10-03: qty 1

## 2016-10-03 MED ORDER — IPRATROPIUM BROMIDE 0.02 % IN SOLN
0.5000 mg | Freq: Four times a day (QID) | RESPIRATORY_TRACT | Status: DC
  Administered 2016-10-04 – 2016-10-05 (×4): 0.5 mg via RESPIRATORY_TRACT
  Filled 2016-10-03 (×5): qty 2.5

## 2016-10-03 MED ORDER — LEVALBUTEROL HCL 1.25 MG/0.5ML IN NEBU
1.2500 mg | INHALATION_SOLUTION | Freq: Four times a day (QID) | RESPIRATORY_TRACT | Status: DC
  Administered 2016-10-04 – 2016-10-05 (×4): 1.25 mg via RESPIRATORY_TRACT
  Filled 2016-10-03 (×5): qty 0.5

## 2016-10-03 NOTE — Evaluation (Signed)
Clinical/Bedside Swallow Evaluation Patient Details  Name: Michelle Mccall MRN: [id number] Date of Birth: [birth year]  Today's Date: 10/03/2016 Time: SLP Start Time (ACUTE ONLY): 1030 SLP Stop Time (ACUTE ONLY): 1110 SLP Time Calculation (min) (ACUTE ONLY): 40 min  Past Medical History:  Past Medical History:  Diagnosis Date  . Hyperlipidemia   . Pneumonia   . Tachyarrhythmia    Past Surgical History:  Past Surgical History:  Procedure Laterality Date  . BACK SURGERY     HPI:  76 yo female who speaks [place] admitted to [facility] with coughing, concern for pna - referred for swallow evaluation.  SLP used interpreter online [name] [phone]) for entire session.  PMH + for pneumonia, sinus tachycardia, diffuse lung infiltrates 8/21.  Pt states she was at a hospital in [place] and had an endoscopy test done - Noted FEES 06/02/16 with mild dysphagia, no aspiration/penetration.     Assessment / Plan / Recommendation Clinical Impression  CN exam unremarkable - for swallow musculature.  Pt does present with symptoms that may be consistent with aspiration - characterized by delayed productive cough with expectoration of thin copious secretions.  She states this occurs regardless of intake, however SLP noted decreased coughing/expectoration.  She initially denied dysphgia but after lengthy interview process, pt finally said "the more I drink, the more I cough".  Note pt had a prior FEES study June 02, 2016 in [place] which was negative for aspiration/penetration.  Mild dysphagia diagnosed - delayed swallow, decreased laryngeal closure.  SLP can not rule out aspiration clinically given increased cough noted with intake than without.   As pt reports the "more she drinks, the more she coughs" - SLP questions possible esophageal component. Educated pt to concern for aspiration and compensation strategies.  Advised her to monitor her swallowing ability closely today to provide detailed information tomorrow.     Question if pt may benefit from dedicated esophageal evaluation given ongoing nature of pulmonary issues.  Will follow up.    SLP Visit Diagnosis: Dysphagia, unspecified (R13.10)    Aspiration Risk  Moderate aspiration risk    Diet Recommendation Regular;Thin liquid   Liquid Administration via: Cup;Straw Medication Administration: Whole meds with liquid Supervision: Patient able to self feed Compensations: Minimize environmental distractions;Slow rate;Small sips/bites Postural Changes: Seated upright at 90 degrees;Remain upright for at least 30 minutes after po intake    Other  Recommendations Recommended Consults: Consider esophageal assessment Oral Care Recommendations: Oral care BID   Follow up Recommendations        Frequency and Duration min 1 x/week  1 week       Prognosis Prognosis for Safe Diet Advancement: Fair Barriers to Reach Goals: Time post onset (since January)      Swallow Study   General Date of Onset: 10/03/16 HPI: 76 yo female who speaks [place] admitted to [facility] with coughing, concern for pna - referred for swallow evaluation.  SLP used interpreter online [name] [phone]) for entire session.  PMH + for pneumonia, sinus tachycardia, diffuse lung infiltrates 8/21.  Pt states she was at a hospital in [place] and had an endoscopy test done - Noted FEES 06/02/16 with mild dysphagia, no aspiration/penetration.   Type of Study: Bedside Swallow Evaluation Diet Prior to this Study: Regular;Thin liquids Temperature Spikes Noted: No Respiratory Status: Other (comment) (HFNC) History of Recent Intubation: No Behavior/Cognition: Alert;Cooperative;Pleasant mood Oral Cavity Assessment: Within Functional Limits Oral Care Completed by SLP: No Oral Cavity - Dentition: Adequate natural dentition Vision: Functional for self-feeding Self-Feeding Abilities:  Able to feed self Patient Positioning: Upright in bed Baseline Vocal Quality: Normal Volitional Cough: Strong (productive  to thin secretions - copious amounts) Volitional Swallow: Able to elicit    Oral/Motor/Sensory Function Overall Oral Motor/Sensory Function: Within functional limits   Ice Chips Ice chips: Not tested   Thin Liquid Thin Liquid: Impaired Pharyngeal  Phase Impairments: Cough - Delayed Other Comments: delayed productive cough noted with approximately 25% of boluses, pt expectorates copious amount of thin secretions    Nectar Thick Nectar Thick Liquid: Not tested   Honey Thick Honey Thick Liquid: Not tested   Puree Puree: Not tested   Solid   GO   Solid: Impaired (pears and crackers) Presentation: Self Fed;Spoon Pharyngeal Phase Impairments: Cough - Delayed        [name] 10/03/2016,11:40 AM   [name], [facility] SLP [phone]

## 2016-10-03 NOTE — Progress Notes (Addendum)
Pt transferred from ICU. Will continue to follow pt for discharge needs.

## 2016-10-03 NOTE — Progress Notes (Signed)
Patient ID: Michelle Mccall, female   DOB: 04/14/1940, 76 y.o.   MRN: [id number]  PROGRESS NOTE    Michelle Mccall  AFB:[id number] DOB: 1940-08-07 DOA: 09/27/2016  PCP: No primary care provider on file.   Brief Narrative:  76 year old [place] female with history of dyslipidemia, unspecified tachyarrhythmia, multiple episodes of pneumonia in past 9 months, November and December 2017, April 2018. Patient transferred from [facility] with progressive shortness of breath at rest and on exertion. No fevers or chills. Patient apparently came back to [place] 09/13/2016 after traveling to [place] and apparently has had pneumonia while in [place] and she brought some medications with her but they were not helping relieve the symptoms. In ED, patient was afebrile, heart rate was 128, normal blood pressure and oxygen saturation was 94% on room air. She was started on broad-spectrum antibiotics while awaiting culture results. Chest x-ray showed left lower lobe and lingular pneumonia and calcified right lung granuloma. This was followed by CT chest with contrast which showed extensive consolidations of the left lower and upper lobes and infiltrate on the right middle lobe with a mild mediastinal adenopathy. When patient arrived to stepdown unit at the time of the admission, patient became tachycardic with pulse of 127, she was hypotensive with blood pressure of 79/50 despite at least 1.5 L normal saline bolus. Patient improved with nebulizer treatment and one dose of Solu-Medrol 125 mg IV, BiPAP. Pt had increased work of breathing 8/18. CCM consulted.   Assessment & Plan:   Principal Problem:   Sepsis ([place]) / multilobar pneumonia / leukocytosis / HCAP  - Sepsis criteria met on the admission with source of infection suspected to be pneumonia - CXR 8/18 showed mild worsening of left lung aeration, increased consolidation in the left upper lung lobe  - CT chest showed extensive consolidations and groundglass  density throughout the left upper and lower lobes most consistent with pneumonia. There are additional infiltrates present in the right middle lobe and mild subpleural nodularity in the right lower lobe suspicious for additional small focus of infection. Follow-up imaging recommended to ensure resolution. Also mild mediastinal adenopathy seen on CT scan likely is reactive - Stopped vanco 8/18 - Influenza PCR negative - Blood cx negative - Resp cx negative - Continue cefepime - Pulmonary following    Active Problems:   Acute respiratory failure with hypoxia - Secondary to HCAP - CXR 8/18 showed mild worsening of left lung aeration when compared to the prior exams. Consolidation has increased in the left upper lobe and persists throughout the remainder of the left lung sparing only the left apex. - Stopped solumedrol 8/20 - Continue xopenex and Atrovent every 6 hours scheduled - Appreciate pulmonary recommendations  - SLP ordered, concerned for recurrent aspiration  - Continue oxygen support via [place] to keep O2 saturation above 90%    Hypokalemia / Hyperkalemia  - Related to sepsis - Potassium WNL    Tachycardia - Likely in the setting of sepsis although patient has history of unspecified tacchyarrhythmia and was apparently on Cardizem and Coumadin - The 12 lead EKG showed sinus tachycardia - Continue coumadin     Normocytic anemia - Hgb stable at 10.7   DVT prophylaxis: On coumadin  Code Status: full code  Family Communication: called daughter again this am, left VM with my cell# to call me back for updates  Disposition Plan: transfer to telemetry    Consultants:   PCCM  PT  SLP  Procedures:   None  Antimicrobials:  Cefepime 8/16 -->  Vanco 8/16 --> 8/19   Subjective: No overnight events.   Objective: Vitals:   10/03/16 0400 10/03/16 0500 10/03/16 0600 10/03/16 0702  BP: 115/64  117/66   Pulse:      Resp: (!) '22 17 18   ' Temp:    98.4 F (36.9 C)    TempSrc:    Oral  SpO2: 97% 92% 94%   Weight:      Height:        Intake/Output Summary (Last 24 hours) at 10/03/16 0808 Last data filed at 10/03/16 0600  Gross per 24 hour  Intake          1410.83 ml  Output              651 ml  Net           759.83 ml   Filed Weights   09/27/16 1841 09/28/16 0057 09/30/16 0421  Weight: 54.2 kg (119 lb 7.8 oz) 53.6 kg (118 lb 2.7 oz) 60.2 kg (132 lb 11.5 oz)    Physical Exam  Constitutional: Appears well-developed and well-nourished. No distress.  CVS: Rate controlled, S1/S2 + Pulmonary: coarse sounds, no wheezing  Abdominal: Soft. BS +,  no distension, tenderness, rebound or guarding.  Musculoskeletal: Normal range of motion. No edema and no tenderness.  Neuro: Alert. Normal reflexes, muscle tone coordination. No cranial nerve deficit. Skin: Skin is warm and dry.  Psychiatric: Normal mood and affect.     Data Reviewed: I have personally reviewed following labs and imaging studies  CBC:  Recent Labs Lab 09/28/16 0115 09/28/16 0238 09/29/16 0249 09/30/16 0259 10/01/16 0421 10/02/16 0329 10/03/16 0259  WBC 10.1 8.9 11.4* 10.8* 11.3* 11.5* 11.5*  NEUTROABS 6.0 5.0 7.6 6.8 9.9*  --   --   HGB 12.0 11.0* 11.0* 11.4* 12.3 10.6* 10.7*  HCT 35.9* 33.4* 33.5* 35.7* 38.2 33.2* 33.4*  MCV 83.9 83.9 84.8 86.7 86.0 86.7 87.0  PLT 284 248 249 287 306 255 034   Basic Metabolic Panel:  Recent Labs Lab 09/28/16 0238  09/29/16 0249 09/30/16 0259 10/01/16 0421 10/02/16 0329 10/03/16 0259  NA 137  < > 137 137 138 135 138  K 3.4*  < > 4.3 4.5 4.6 5.2* 4.8  CL 104  < > 106 104 103 103 103  CO2 28  < > '26 28 28 25 31  ' GLUCOSE 113*  < > 129* 104* 182* 206* 133*  BUN 17  < > '13 15 14 15 17  ' CREATININE 0.61  < > 0.60 0.53 0.61 0.50 0.36*  CALCIUM 8.2*  < > 8.8* 8.6* 9.1 9.0 9.1  MG 1.7  --   --   --   --   --   --   PHOS 3.1  --   --   --   --   --   --   < > = values in this interval not displayed. GFR: Estimated Creatinine  Clearance: 49.1 mL/min (A) (by C-G formula based on SCr of 0.36 mg/dL (L)). Liver Function Tests:  Recent Labs Lab 09/28/16 0115 09/28/16 0238  AST 14* 14*  ALT 9* 9*  ALKPHOS 56 52  BILITOT 0.6 0.4  PROT 6.3* 5.8*  ALBUMIN 2.8* 2.5*   No results for input(s): LIPASE, AMYLASE in the last 168 hours. No results for input(s): AMMONIA in the last 168 hours. Coagulation Profile:  Recent Labs Lab 09/29/16 0249 09/30/16 0259 10/01/16 0421 10/02/16 0329 10/03/16 0259  INR  0.93 1.12 1.82 2.61 2.23   Cardiac Enzymes:  Recent Labs Lab 09/27/16 1900 09/28/16 0428  TROPONINI <0.03 <0.03   BNP (last 3 results) No results for input(s): PROBNP in the last 8760 hours. HbA1C: No results for input(s): HGBA1C in the last 72 hours. CBG: No results for input(s): GLUCAP in the last 168 hours. Lipid Profile: No results for input(s): CHOL, HDL, LDLCALC, TRIG, CHOLHDL, LDLDIRECT in the last 72 hours. Thyroid Function Tests: No results for input(s): TSH, T4TOTAL, FREET4, T3FREE, THYROIDAB in the last 72 hours. Anemia Panel: No results for input(s): VITAMINB12, FOLATE, FERRITIN, TIBC, IRON, RETICCTPCT in the last 72 hours. Urine analysis: No results found for: COLORURINE, APPEARANCEUR, LABSPEC, PHURINE, GLUCOSEU, HGBUR, BILIRUBINUR, KETONESUR, PROTEINUR, UROBILINOGEN, NITRITE, LEUKOCYTESUR Sepsis Labs: '@[email or web address]' (procalcitonin:4,lacticidven:4)   ) Recent Results (from the past 240 hour(s))  Blood culture (routine x 2)     Status: None   Collection Time: 09/27/16  6:50 PM  Result Value Ref Range Status   Specimen Description BLOOD RIGHT ANTECUBITAL  Final   Special Requests Blood Culture adequate volume IN PEDIATRIC BOTTLE  Final   Culture   Final    NO GROWTH 5 DAYS Performed at [facility], [place] [address] [place], [place] [address]    Report Status 10/02/2016 FINAL  Final  Blood culture (routine x 2)     Status: None   Collection Time: 09/27/16  7:05 PM  Result Value  Ref Range Status   Specimen Description BLOOD BLOOD LEFT ARM  Final   Special Requests   Final    Blood Culture adequate volume BOTTLES DRAWN AEROBIC AND ANAEROBIC   Culture   Final    NO GROWTH 5 DAYS Performed at [facility], [place] [address], [place], [place] [address]    Report Status 10/02/2016 FINAL  Final  MRSA PCR Screening     Status: None   Collection Time: 09/28/16 12:56 AM  Result Value Ref Range Status   MRSA by PCR NEGATIVE NEGATIVE Final    Comment:        The GeneXpert MRSA Assay (FDA approved for NASAL specimens only), is one component of a comprehensive MRSA colonization surveillance program. It is not intended to diagnose MRSA infection nor to guide or monitor treatment for MRSA infections.   Culture, sputum-assessment     Status: None   Collection Time: 09/28/16  4:37 AM  Result Value Ref Range Status   Specimen Description SPUTUM  Final   Special Requests Normal  Final   Sputum evaluation THIS SPECIMEN IS ACCEPTABLE FOR SPUTUM CULTURE  Final   Report Status 09/28/2016 FINAL  Final  Culture, respiratory (NON-Expectorated)     Status: None   Collection Time: 09/28/16  4:37 AM  Result Value Ref Range Status   Specimen Description SPUTUM  Final   Special Requests Normal Reflexed from [id number]  Final   Gram Stain NO WBC SEEN NO ORGANISMS SEEN   Final   Culture   Final    Consistent with normal respiratory flora. Performed at [facility], [place] [address] [place], [place] [address]    Report Status 09/30/2016 FINAL  Final      Radiology Studies: Dg Chest Port 1 View  Result Date: 10/03/2016 CLINICAL DATA:  Pneumonia . EXAM: PORTABLE CHEST 1 VIEW COMPARISON:  09/30/2016. FINDINGS: Diffuse left lung infiltrate is again noted without significant interim change. Mild right base atelectasis. Calcified granuloma noted right lung base. Left pleural effusion most likely present.  No pneumothorax. Cardiac contour is obscured by the diffuse left lung  infiltrate. No acute bony abnormality. IMPRESSION: 1. Diffuse left lung infiltrate again noted consistent with pneumonia. Associated left pleural effusion most likely present. No interim change. 2.  Mild right base atelectasis. Electronically Signed   By: Marcello Moores  Register   On: 10/03/2016 07:19   Dg Chest Port 1 View  Result Date: 09/30/2016 CLINICAL DATA:  Congestive productive cough, sob, admitted for sepsis, tachycardia, hx multiple pneumonias since November of last year. EXAM: PORTABLE CHEST 1 VIEW COMPARISON:  Chest CT and chest radiographs, 09/27/2016. FINDINGS: There is consolidation throughout most of the left lung, sparing the left apex. Lung consolidation in the upper lobe on the left has mildly increased from the prior exams. Right lung is hyperexpanded. Stable granuloma in the inferior right upper lobe. Right lung is otherwise clear. Cardiac silhouette is mostly obscured by the left lung consolidation. Cardiac silhouette is grossly normal in size. No pneumothorax. IMPRESSION: 1. Mild worsening of left lung aeration when compared to the prior exams. Consolidation has increased in the left upper lobe and persists throughout the remainder of the left lung sparing only the left apex. Electronically Signed   By: Lajean Manes M.D.   On: 09/30/2016 08:19        Scheduled Meds: . aspirin  81 mg Oral Daily  . famotidine  20 mg Oral BID  . guaiFENesin  1,200 mg Oral BID  . ipratropium  0.5 mg Nebulization Q6H  . levalbuterol  1.25 mg Nebulization Q6H  . mouth rinse  15 mL Mouth Rinse BID  . Warfarin - Pharmacist Dosing Inpatient   Does not apply q1800   Continuous Infusions: . sodium chloride 50 mL/hr at 10/03/16 0600  . ceFEPime (MAXIPIME) IV Stopped (10/03/16 0058)     LOS: 6 days    Time spent: 25 minutes  Greater than 50% of the time spent on counseling and coordinating the care.   Leisa Lenz, MD [facility] Pager [phone]  If 7PM-7AM, please contact  night-coverage [email or web address] Password [email or web address] 10/03/2016, 8:08 AM

## 2016-10-03 NOTE — Progress Notes (Signed)
PT Cancellation Note  Patient Details Name: Dannelle Rhymes MRN: [id number] DOB: Dec 29, 1940   Cancelled Treatment:     PT deferred - pt continues on HFNC.  RN reports nursing assisting pt to recliner and BSC as needed/tolerated.  Will follow with improvement in resp status.     Neosha Switalski 10/03/2016, 8:41 AM

## 2016-10-03 NOTE — Progress Notes (Signed)
Anticoagulation Consult Note  Pharmacy Consult for Warfarin Indication: unspecified tachycardia   No Known Allergies  Patient Measurements: Height: 5\' 1"  (154.9 cm) Weight: 132 lb 11.5 oz (60.2 kg) IBW/kg (Calculated) : 47.8  Vital Signs: Temp: 98.4 F (36.9 C) (08/21 0702) Temp Source: Oral (08/21 0702) BP: 120/98 (08/21 0800)  Labs:  Recent Labs  10/01/16 0421 10/02/16 0329 10/03/16 0259  HGB 12.3 10.6* 10.7*  HCT 38.2 33.2* 33.4*  PLT 306 255 273  LABPROT 21.3* 28.4* 25.1*  INR 1.82 2.61 2.23  CREATININE 0.61 0.50 0.36*    Estimated Creatinine Clearance: 49.1 mL/min (A) (by C-G formula based on SCr of 0.36 mg/dL (L)).   Medications:  Scheduled:  . acidophilus  1 capsule Oral Daily  . aspirin  81 mg Oral Daily  . famotidine  20 mg Oral BID  . guaiFENesin  1,200 mg Oral BID  . ipratropium  0.5 mg Nebulization Q6H  . levalbuterol  1.25 mg Nebulization Q6H  . mouth rinse  15 mL Mouth Rinse BID  . Warfarin - Pharmacist Dosing Inpatient   Does not apply q1800   Infusions:  . sodium chloride 50 mL/hr at 10/03/16 0600  . ceFEPime (MAXIPIME) IV Stopped (10/03/16 6811)   Assessment: 76 yo female admitted with pneumonia. Her daughter reports that she is taking warfarin 5mg  daily for an abnormal heart rhythm but has not taken it in the past week. She was unable to say what physician monitors the INR.  INR is subtherapeutic at 0.92. Pharmacy is consulted to resume dosing.  10/03/2016:  INR 2.23, therapeutic   Inpt doses:  3 doses of 7.5mg , 2.5 mg, 1 mg   CBC: Hg low but stable at 10.7, Plt WNL  No bleeding reported  Regular diet ordered  Drug-drug interactions- no major DDI.  Broad-spectrum antibiotics can increase INR.   SCr 0.36, CrCl ~ 49 ml/min.    Goal of Therapy:  INR 2-3 Monitor platelets by anticoagulation protocol: Yes  Plan:   Warfarin 2.5 mg PO x 1 today  Daily PT/INR  Consider stopping aspirin with concurrent warfarin  therapy   Gretta Arab PharmD, BCPS Pager [phone] 10/03/2016 9:51 AM

## 2016-10-03 NOTE — Progress Notes (Signed)
Patient's daughter made aware of transferring to Room 1430. Patient is stable at transfer.

## 2016-10-03 NOTE — Progress Notes (Signed)
PULMONARY / CRITICAL CARE MEDICINE   Name: Michelle Mccall MRN: [id number] DOB: 31-Dec-1940    ADMISSION DATE:  09/27/2016 CONSULTATION DATE: 09/28/2016  REFERRING MD:  Neil Crouch MD  CHIEF COMPLAINT:  Sepsis, pneumonia  HISTORY OF PRESENT ILLNESS:   76 yo female with recurrent episodes of pneumonia.  SUBJECTIVE:  Still has cough.  Feels like sputum production is decreased and breathing seems easier.  She is worried about her bowels while being on antibiotics.  She didn't feel her heart rate going too fast last night.  Interview conducted with assistance of [place] interpreter.  VITAL SIGNS: BP (!) 120/98   Pulse (!) 101   Temp 98.4 F (36.9 C) (Oral)   Resp (!) 25   Ht 5\' 1"  (1.549 m)   Wt 132 lb 11.5 oz (60.2 kg)   SpO2 95%   BMI 25.08 kg/m   INTAKE / OUTPUT: I/O last 3 completed shifts: In: 3910.8 [P.O.:300; I.V.:3460.8; IV Piggyback:150] Out: 1001 [Urine:1000; Stool:1]  PHYSICAL EXAMINATION:  General - pleasant Eyes - pupils reactive ENT - no sinus tenderness, no oral exudate, no LAN Cardiac - regular, no murmur Chest - rales on Lt > Rt Abd - soft, non tender Ext - no edema Skin - no rashes Neuro - normal strength Psych - normal mood  LABS:  BMET  Recent Labs Lab 10/01/16 0421 10/02/16 0329 10/03/16 0259  NA 138 135 138  K 4.6 5.2* 4.8  CL 103 103 103  CO2 28 25 31   BUN 14 15 17   CREATININE 0.61 0.50 0.36*  GLUCOSE 182* 206* 133*    Electrolytes  Recent Labs Lab 09/28/16 0238  10/01/16 0421 10/02/16 0329 10/03/16 0259  CALCIUM 8.2*  < > 9.1 9.0 9.1  MG 1.7  --   --   --   --   PHOS 3.1  --   --   --   --   < > = values in this interval not displayed.  CBC  Recent Labs Lab 10/01/16 0421 10/02/16 0329 10/03/16 0259  WBC 11.3* 11.5* 11.5*  HGB 12.3 10.6* 10.7*  HCT 38.2 33.2* 33.4*  PLT 306 255 273    Coag's  Recent Labs Lab 10/01/16 0421 10/02/16 0329 10/03/16 0259  INR 1.82 2.61 2.23    Sepsis Markers  Recent  Labs Lab 09/27/16 1911 09/28/16 0115 09/28/16 0428 10/01/16 0421 10/02/16 0329  LATICACIDVEN 1.72 1.3 1.0  --   --   PROCALCITON  --  <0.10  --  <0.10 <0.10    ABG  Recent Labs Lab 09/30/16 0749  PHART 7.404  PCO2ART 48.0  PO2ART 80.7*    Liver Enzymes  Recent Labs Lab 09/28/16 0115 09/28/16 0238  AST 14* 14*  ALT 9* 9*  ALKPHOS 56 52  BILITOT 0.6 0.4  ALBUMIN 2.8* 2.5*    Cardiac Enzymes  Recent Labs Lab 09/27/16 1900 09/28/16 0428  TROPONINI <0.03 <0.03    Glucose No results for input(s): GLUCAP in the last 168 hours.  Imaging Dg Chest Port 1 View  Result Date: 10/03/2016 CLINICAL DATA:  Pneumonia . EXAM: PORTABLE CHEST 1 VIEW COMPARISON:  09/30/2016. FINDINGS: Diffuse left lung infiltrate is again noted without significant interim change. Mild right base atelectasis. Calcified granuloma noted right lung base. Left pleural effusion most likely present. No pneumothorax. Cardiac contour is obscured by the diffuse left lung infiltrate. No acute bony abnormality. IMPRESSION: 1. Diffuse left lung infiltrate again noted consistent with pneumonia. Associated left pleural effusion most likely present.  No interim change. 2.  Mild right base atelectasis. Electronically Signed   By: Marcello Moores  Register   On: 10/03/2016 07:19    STUDIES:  CT chest 8/15 >> extensive consolidation on Lt, 12 mm RUL nodule Echo 8/16 >> EF 60 to 65%, grade 1 DD, severe RV dilation, mod TR, PAS 58 mmHg Serology 8/19 >> ANA, RF, anti CCP, ANCA all negative  CULTURES: Blood 8/15 >>  Sputum 8/16 >> oral flora  ANTIBIOTICS: Vanco 8/16 > 8/18 Cefepime 8/16 >  SIGNIFICANT EVENTS: 8/16 admit with multilobar PNA  ASSESSMENT / PLAN:  Acute hypoxic respiratory failure 2nd to recurrent pneumonia. - I am more concerned about recurrent aspiration - f/u with speech therapy - day 6 of Abx - f/u CXR intermittently - scheduled BDs - bronchial hygiene  Acute cor pulmonale. - she had normal  Echo report from 06/03/16 at [place] - suspect related to acute hypoxia from pneumonia - consider echo with bubble study when more stable - lasix 40 mg po x 1 on 8/21  Intermittent tachycardia. - monitor hemodynamics - prn lopressor - coumadin per primary team  Bowel regimen. - add lactobacillus  Pulmonary plan in place.  PCCM will sign off.  Please call if additional help needed.  Chesley Mires, MD [facility] Pulmonary/Critical Care 10/03/2016, 9:17 AM Pager:  [phone] After 3pm call: [phone]

## 2016-10-04 ENCOUNTER — Inpatient Hospital Stay (HOSPITAL_COMMUNITY): Payer: Medicaid Other

## 2016-10-04 DIAGNOSIS — J690 Pneumonitis due to inhalation of food and vomit: Secondary | ICD-10-CM

## 2016-10-04 DIAGNOSIS — R000 Tachycardia, unspecified: Secondary | ICD-10-CM

## 2016-10-04 LAB — CBC
HEMATOCRIT: 38.6 % (ref 36.0–46.0)
Hemoglobin: 12.1 g/dL (ref 12.0–15.0)
MCH: 26.9 pg (ref 26.0–34.0)
MCHC: 31.3 g/dL (ref 30.0–36.0)
MCV: 86 fL (ref 78.0–100.0)
Platelets: 349 10*3/uL (ref 150–400)
RBC: 4.49 MIL/uL (ref 3.87–5.11)
RDW: 14.6 % (ref 11.5–15.5)
WBC: 9.8 10*3/uL (ref 4.0–10.5)

## 2016-10-04 LAB — BASIC METABOLIC PANEL
Anion gap: 4 — ABNORMAL LOW (ref 5–15)
BUN: 15 mg/dL (ref 6–20)
CALCIUM: 8.7 mg/dL — AB (ref 8.9–10.3)
CO2: 36 mmol/L — AB (ref 22–32)
CREATININE: 0.45 mg/dL (ref 0.44–1.00)
Chloride: 99 mmol/L — ABNORMAL LOW (ref 101–111)
GFR calc non Af Amer: >60 mL/min (ref >60)
Glucose, Bld: 89 mg/dL (ref 65–99)
Potassium: 3.8 mmol/L (ref 3.5–5.1)
Sodium: 139 mmol/L (ref 135–145)

## 2016-10-04 LAB — PROTIME-INR
INR: 1.39
PROTHROMBIN TIME: 17.1 s — AB (ref 11.4–15.2)

## 2016-10-04 MED ORDER — PREDNISONE 20 MG PO TABS
40.0000 mg | ORAL_TABLET | Freq: Every day | ORAL | Status: DC
  Administered 2016-10-04: 40 mg via ORAL
  Filled 2016-10-04: qty 2

## 2016-10-04 MED ORDER — WARFARIN SODIUM 5 MG PO TABS
5.0000 mg | ORAL_TABLET | Freq: Once | ORAL | Status: AC
  Administered 2016-10-04: 5 mg via ORAL
  Filled 2016-10-04: qty 1

## 2016-10-04 MED ORDER — SODIUM CHLORIDE 0.9 % IV BOLUS (SEPSIS)
500.0000 mL | Freq: Once | INTRAVENOUS | Status: AC
  Administered 2016-10-04: 500 mL via INTRAVENOUS

## 2016-10-04 MED ORDER — DILTIAZEM HCL ER COATED BEADS 180 MG PO CP24
180.0000 mg | ORAL_CAPSULE | Freq: Every day | ORAL | Status: DC
  Administered 2016-10-04 – 2016-10-16 (×13): 180 mg via ORAL
  Filled 2016-10-04 (×13): qty 1

## 2016-10-04 MED ORDER — FUROSEMIDE 10 MG/ML IJ SOLN
40.0000 mg | Freq: Once | INTRAMUSCULAR | Status: AC
Start: 2016-10-04 — End: 2016-10-04
  Administered 2016-10-04: 40 mg via INTRAVENOUS
  Filled 2016-10-04: qty 4

## 2016-10-04 MED ORDER — NYSTATIN 100000 UNIT/GM EX CREA
TOPICAL_CREAM | Freq: Two times a day (BID) | CUTANEOUS | Status: DC
  Administered 2016-10-04 – 2016-10-14 (×19): via TOPICAL
  Filled 2016-10-04 (×2): qty 15

## 2016-10-04 NOTE — Progress Notes (Signed)
Pt ambulated in hall with PT.  Beginning Oxygen sat was 93% on 3LNC.  Oxygen sat ranged from 88%-91% during ambulation and HR 100-110 on 3L [place].

## 2016-10-04 NOTE — Progress Notes (Signed)
PT Cancellation Note  Patient Details Name: Michelle Mccall MRN: [id number] DOB: 1940-04-26   Cancelled Treatment:    Reason Eval/Treat Not Completed: Patient at procedure or test/unavailable. Pt off the unit.  Will check back as schedule permits.   Galen Manila 10/04/2016, 12:09 PM

## 2016-10-04 NOTE — Progress Notes (Signed)
PROGRESS NOTE  Michelle Mccall  MPN:[id number] DOB: 05/27/1940 DOA: 09/27/2016 PCP: No primary care provider on file.  Brief Narrative:   76 year old [place] female with history of dyslipidemia, unspecified tachyarrhythmia, multiple episodes of pneumonia in past 9 months, November and December 2017, April 2018. Patient transferred from [facility] with progressive shortness of breath at rest and on exertion. No fevers or chills. Patient apparently came back to [place] 09/13/2016 after traveling to [place] and apparently has had pneumonia while in [place] and she brought some medications with her but they were not helping relieve the symptoms. In ED, patient was afebrile, heart rate was 128, normal blood pressure and oxygen saturation was 94% on room air. She was started on broad-spectrum antibiotics while awaiting culture results. Chest x-ray showed left lower lobe and lingular pneumonia and calcified right lung granuloma. This was followed by CT chest with contrast which showed extensive consolidations of the left lower and upper lobes and infiltrate on the right middle lobe with a mild mediastinal adenopathy. When patient arrived to stepdown unit at the time of the admission, patient became tachycardic with pulse of 127, she was hypotensive with blood pressure of 79/50 despite at least 1.5 L normal saline bolus. Patient improved with nebulizer treatment and one dose of Solu-Medrol 125 mg IV, BiPAP.  Pt had increased work of breathing 8/18. CCM was consulted and has been assisting with care.  She continues to have copious clear frothy phlegm, frequent cough with dyspnea.  SLP feels she may have an esophageal dysphagia.  Esophogram has been ordered, results pending.    Assessment & Plan:   Principal Problem:   Sepsis ([place]) Active Problems:   HCAP (healthcare-associated pneumonia)   Hypokalemia   Anemia   Hypoalbuminemia   Tachyarrhythmia   Hyperlipidemia   Acute respiratory failure (HCC)  Abnormal EKG  Sepsis (HCC) and acute respiratory failure with hypoxia secondary to multilobar HCAP with near complete white-out of left lung - Stopped vanco 8/18 - Influenza PCR negative - Blood cx negative - Resp cx negative - Continue cefepime day 7 of antibiotics - Pulmonary following  - repeat CT as outpatient once treatment completed -  Scar over left posterior ribs was due to distant surgery that required harvesting a rib bone for a back surgery.  Frothy sputum  -  D/c guaifenesin -  Additional lasix 40mg  IV once  Dysphagia -  Appreciate SLP assistance -  Esophogram   Hypokalemia / Hyperkalemia  - Related to sepsis - Potassium WNL   episodic SVT in setting of sepsis with known history of tachyarrhythmia.   -  Resume diltiazem now that BP improved.  Hold for SBP < 100 - Continue coumadin  -  Continue telemetry  Normocytic anemia - Hgb stable at 10.7  DVT prophylaxis:  warfarin Code Status:   Full code Family Communication:  Patient with ipad interpreter  Disposition Plan:  PT/OT assessments.  Ambulatory pulse ox when able.  Just transitioned from 6-8L HFNC to 2-4L HFNC today.  Discharge in a few more days.     Consultants:   PCCM  Procedures:  none  Antimicrobials:  Anti-infectives    Start     Dose/Rate Route Frequency Ordered Stop   10/01/16 1200  ceFEPIme (MAXIPIME) 1 g in dextrose 5 % 50 mL IVPB     1 g 100 mL/hr over 30 Minutes Intravenous Every 12 hours 10/01/16 1028     09/28/16 2100  vancomycin (VANCOCIN) IVPB 750 mg/150 ml premix  Status:  Discontinued     750 mg 150 mL/hr over 60 Minutes Intravenous Every 24 hours 09/27/16 2102 09/30/16 0647   09/28/16 2100  ceFEPIme (MAXIPIME) 1 g in dextrose 5 % 50 mL IVPB  Status:  Discontinued     1 g 100 mL/hr over 30 Minutes Intravenous Every 24 hours 09/27/16 2104 10/01/16 1028   09/27/16 2200  ceFEPIme (MAXIPIME) 1 g in dextrose 5 % 50 mL IVPB  Status:  Discontinued     1 g 100 mL/hr over 30  Minutes Intravenous Every 8 hours 09/27/16 2050 09/27/16 2104   09/27/16 2100  vancomycin (VANCOCIN) IVPB 1000 mg/200 mL premix     1,000 mg 200 mL/hr over 60 Minutes Intravenous  Once 09/27/16 2050 09/27/16 2245   09/27/16 2057  ceFEPIme (MAXIPIME) 1 g injection    Comments:  Woodward Ku   : cabinet override      09/27/16 2057 09/28/16 0859       Subjective:  Cough with copious thin clear secretions/frothy.  Afraid to go asleep because she is afraid she will choke on her secretions.  Has to sit upright due to dyspnea.    Objective: Vitals:   10/04/16 0802 10/04/16 0804 10/04/16 1037 10/04/16 1445  BP:    (!) 89/51  Pulse:    78  Resp:    18  Temp:    98.4 F (36.9 C)  TempSrc:    Oral  SpO2: 95% 95%  94%  Weight:   59.8 kg (131 lb 13.4 oz)   Height:        Intake/Output Summary (Last 24 hours) at 10/04/16 1455 Last data filed at 10/04/16 1300  Gross per 24 hour  Intake              520 ml  Output             1252 ml  Net             -732 ml   Filed Weights   09/28/16 0057 09/30/16 0421 10/04/16 1037  Weight: 53.6 kg (118 lb 2.7 oz) 60.2 kg (132 lb 11.5 oz) 59.8 kg (131 lb 13.4 oz)    Examination:  General exam:  Adult female, sitting upright, coughing up copious clear phlegm every few minutes during my interview.  No acute distress.  HEENT:  NCAT, MMM Respiratory system: diminished throughout the left lobes. Wheezy.  Faint rales heard throughout left lung fields.  + rhonchi. Cardiovascular system: Regular rate and rhythm, normal S1/S2. No murmurs, rubs, gallops or clicks.  Warm extremities Gastrointestinal system: Normal active bowel sounds, soft, nondistended, nontender. MSK:  Normal tone and bulk, no lower extremity edema Neuro:  Grossly intact       Data Reviewed: I have personally reviewed following labs and imaging studies  CBC:  Recent Labs Lab 09/28/16 0115 09/28/16 0238 09/29/16 0249 09/30/16 0259 10/01/16 0421 10/02/16 0329 10/03/16 0259  10/04/16 0427  WBC 10.1 8.9 11.4* 10.8* 11.3* 11.5* 11.5* 9.8  NEUTROABS 6.0 5.0 7.6 6.8 9.9*  --   --   --   HGB 12.0 11.0* 11.0* 11.4* 12.3 10.6* 10.7* 12.1  HCT 35.9* 33.4* 33.5* 35.7* 38.2 33.2* 33.4* 38.6  MCV 83.9 83.9 84.8 86.7 86.0 86.7 87.0 86.0  PLT 284 248 249 287 306 255 273 740   Basic Metabolic Panel:  Recent Labs Lab 09/28/16 0238  09/30/16 0259 10/01/16 0421 10/02/16 0329 10/03/16 0259 10/04/16 0427  NA 137  < > 137 138 135  138 139  K 3.4*  < > 4.5 4.6 5.2* 4.8 3.8  CL 104  < > 104 103 103 103 99*  CO2 28  < > 28 28 25 31  36*  GLUCOSE 113*  < > 104* 182* 206* 133* 89  BUN 17  < > 15 14 15 17 15   CREATININE 0.61  < > 0.53 0.61 0.50 0.36* 0.45  CALCIUM 8.2*  < > 8.6* 9.1 9.0 9.1 8.7*  MG 1.7  --   --   --   --   --   --   PHOS 3.1  --   --   --   --   --   --   < > = values in this interval not displayed. GFR: Estimated Creatinine Clearance: 48.9 mL/min (by C-G formula based on SCr of 0.45 mg/dL). Liver Function Tests:  Recent Labs Lab 09/28/16 0115 09/28/16 0238  AST 14* 14*  ALT 9* 9*  ALKPHOS 56 52  BILITOT 0.6 0.4  PROT 6.3* 5.8*  ALBUMIN 2.8* 2.5*   No results for input(s): LIPASE, AMYLASE in the last 168 hours. No results for input(s): AMMONIA in the last 168 hours. Coagulation Profile:  Recent Labs Lab 09/30/16 0259 10/01/16 0421 10/02/16 0329 10/03/16 0259 10/04/16 0427  INR 1.12 1.82 2.61 2.23 1.39   Cardiac Enzymes:  Recent Labs Lab 09/27/16 1900 09/28/16 0428  TROPONINI <0.03 <0.03   BNP (last 3 results) No results for input(s): PROBNP in the last 8760 hours. HbA1C: No results for input(s): HGBA1C in the last 72 hours. CBG: No results for input(s): GLUCAP in the last 168 hours. Lipid Profile: No results for input(s): CHOL, HDL, LDLCALC, TRIG, CHOLHDL, LDLDIRECT in the last 72 hours. Thyroid Function Tests: No results for input(s): TSH, T4TOTAL, FREET4, T3FREE, THYROIDAB in the last 72 hours. Anemia Panel: No results  for input(s): VITAMINB12, FOLATE, FERRITIN, TIBC, IRON, RETICCTPCT in the last 72 hours. Urine analysis: No results found for: COLORURINE, APPEARANCEUR, LABSPEC, PHURINE, GLUCOSEU, HGBUR, BILIRUBINUR, KETONESUR, PROTEINUR, UROBILINOGEN, NITRITE, LEUKOCYTESUR Sepsis Labs: @LABRCNTIP (procalcitonin:4,lacticidven:4)  ) Recent Results (from the past 240 hour(s))  Blood culture (routine x 2)     Status: None   Collection Time: 09/27/16  6:50 PM  Result Value Ref Range Status   Specimen Description BLOOD RIGHT ANTECUBITAL  Final   Special Requests Blood Culture adequate volume IN PEDIATRIC BOTTLE  Final   Culture   Final    NO GROWTH 5 DAYS Performed at [facility], [place] [address], [place], [place] [address]    Report Status 10/02/2016 FINAL  Final  Blood culture (routine x 2)     Status: None   Collection Time: 09/27/16  7:05 PM  Result Value Ref Range Status   Specimen Description BLOOD BLOOD LEFT ARM  Final   Special Requests   Final    Blood Culture adequate volume BOTTLES DRAWN AEROBIC AND ANAEROBIC   Culture   Final    NO GROWTH 5 DAYS Performed at [facility], [place] [address] [place], [place] [address]    Report Status 10/02/2016 FINAL  Final  MRSA PCR Screening     Status: None   Collection Time: 09/28/16 12:56 AM  Result Value Ref Range Status   MRSA by PCR NEGATIVE NEGATIVE Final    Comment:        The GeneXpert MRSA Assay (FDA approved for NASAL specimens only), is one component of a comprehensive MRSA colonization surveillance program. It is  not intended to diagnose MRSA infection nor to guide or monitor treatment for MRSA infections.   Culture, sputum-assessment     Status: None   Collection Time: 09/28/16  4:37 AM  Result Value Ref Range Status   Specimen Description SPUTUM  Final   Special Requests Normal  Final   Sputum evaluation THIS SPECIMEN IS ACCEPTABLE FOR SPUTUM CULTURE  Final   Report Status 09/28/2016 FINAL  Final  Culture,  respiratory (NON-Expectorated)     Status: None   Collection Time: 09/28/16  4:37 AM  Result Value Ref Range Status   Specimen Description SPUTUM  Final   Special Requests Normal Reflexed from [id number]  Final   Gram Stain NO WBC SEEN NO ORGANISMS SEEN   Final   Culture   Final    Consistent with normal respiratory flora. Performed at [facility], [place] [address] [place], [place] [address]    Report Status 09/30/2016 FINAL  Final      Radiology Studies: Dg Chest Port 1 View  Result Date: 10/03/2016 CLINICAL DATA:  Pneumonia . EXAM: PORTABLE CHEST 1 VIEW COMPARISON:  09/30/2016. FINDINGS: Diffuse left lung infiltrate is again noted without significant interim change. Mild right base atelectasis. Calcified granuloma noted right lung base. Left pleural effusion most likely present. No pneumothorax. Cardiac contour is obscured by the diffuse left lung infiltrate. No acute bony abnormality. IMPRESSION: 1. Diffuse left lung infiltrate again noted consistent with pneumonia. Associated left pleural effusion most likely present. No interim change. 2.  Mild right base atelectasis. Electronically Signed   By: Marcello Moores  Register   On: 10/03/2016 07:19     Scheduled Meds: . acidophilus  1 capsule Oral Daily  . aspirin  81 mg Oral Daily  . diltiazem  180 mg Oral Daily  . famotidine  20 mg Oral BID  . guaiFENesin  1,200 mg Oral BID  . ipratropium  0.5 mg Nebulization QID  . levalbuterol  1.25 mg Nebulization QID  . mouth rinse  15 mL Mouth Rinse BID  . nystatin cream   Topical BID  . predniSONE  40 mg Oral Q breakfast  . warfarin  5 mg Oral ONCE-1800  . Warfarin - Pharmacist Dosing Inpatient   Does not apply q1800   Continuous Infusions: . ceFEPime (MAXIPIME) IV Stopped (10/04/16 1240)     LOS: 7 days    Time spent: 30 min    Janece Canterbury, MD [facility] Pager [phone]  If 7PM-7AM, please contact night-coverage [email or web address] Password [facility] 10/04/2016, 2:55 PM

## 2016-10-04 NOTE — Progress Notes (Addendum)
ANTICOAGULATION CONSULT NOTE / Antibiotic Note  Pharmacy Consult for Coumadin, Cefepime Indication: unspecified tachycardia, Pneumonia  No Known Allergies  Patient Measurements: Height: 5\' 1"  (154.9 cm) Weight: 132 lb 11.5 oz (60.2 kg) IBW/kg (Calculated) : 47.8  Vital Signs: Temp: 98.3 F (36.8 C) (08/22 0511) Temp Source: Oral (08/22 0511) BP: 99/70 (08/22 0511) Pulse Rate: 76 (08/22 0511)  Labs:  Recent Labs  10/02/16 0329 10/03/16 0259 10/04/16 0427  HGB 10.6* 10.7* 12.1  HCT 33.2* 33.4* 38.6  PLT 255 273 349  LABPROT 28.4* 25.1* 17.1*  INR 2.61 2.23 1.39  CREATININE 0.50 0.36* 0.45    Estimated Creatinine Clearance: 49.1 mL/min (by C-G formula based on SCr of 0.45 mg/dL).   Medical History: Past Medical History:  Diagnosis Date  . Hyperlipidemia   . Pneumonia   . Tachyarrhythmia     Medications:  Scheduled:  . acidophilus  1 capsule Oral Daily  . aspirin  81 mg Oral Daily  . famotidine  20 mg Oral BID  . guaiFENesin  1,200 mg Oral BID  . ipratropium  0.5 mg Nebulization QID  . levalbuterol  1.25 mg Nebulization QID  . mouth rinse  15 mL Mouth Rinse BID  . Warfarin - Pharmacist Dosing Inpatient   Does not apply q1800   Infusions:  . ceFEPime (MAXIPIME) IV Stopped (10/03/16 2346)   PRN: ipratropium, levalbuterol, metoprolol tartrate, ondansetron (ZOFRAN) IV   Antimicrobials this admission:  Vancomycin 09/27/2016 >> 8/18 Cefepime 09/27/2016 >>   Dose adjustments this admission:  8/19 Increase cefepime from 1g IV q24h to 1g IV q12h  Microbiology results:  8/15 BCx: ngf 8/16 Urine strep: neg 8/16 Urine legionella: neg 8/16 Sputum: no organisms seen, cx c/w normal flora (F)  8/16 MRSA PCR: neg 8/17 Influenza panel: neg   Assessment: 76 yo female admitted with pneumonia. Her daughter reports that she is taking warfarin 5mg  daily for an abnormal heart rhythm but has not taken it in the past week. She was unable to say what physician monitors  the INR.  INR is subtherapeutic at 0.92. Pharmacy is consulted to resume dosing now.  10/04/2016:  Day # 7 Cefepime.  INR dropped to 1.39 overnight in response to lower doses provided the past two days.  Patient's INR had a brisk response to 7.5 mg dose so lower doses were subsequently used to avoid supratherapeutic INR.  CBC: Hg improved 12.1, Pltc WNL  No bleeding reported  Regular diet ordered  Drug-drug interactions- no major DDI.  Broad-spectrum antibiotics can increase INR.   SCr low, stable. CrCl 49 ml/min  Goal of Therapy:  INR 2-3 Monitor platelets by anticoagulation protocol: Yes  Plan:   Warfarin 5mg  PO x 1 today  Daily PT/INR  Consider stopping aspirin with concurrent warfarin therapy  Continue Cefepime 1g IV q12h. Consider discontinuing after doses of 8/23 for completion of 8 day antibiotic course.  PCT levels have been undetectable.  Hershal Coria, PharmD, BCPS Pager: [phone] 10/04/2016 7:53 AM

## 2016-10-04 NOTE — Progress Notes (Signed)
Physical Therapy Treatment Patient Details Name: Michelle Mccall MRN: [id number] DOB: [birth year] Today's Date: 10/04/2016    History of Present Illness Pt admitted with dx sepsis 2* recurrent PNA    PT Comments    Pt's o2 sat was 88-90% on 3 L/min with ambulation with HR up to 129 bpm.  Will continue to follow acutely.   Follow Up Recommendations  No PT follow up     Equipment Recommendations  None recommended by PT    Recommendations for Other Services       Precautions / Restrictions Precautions Precautions: Fall Restrictions Weight Bearing Restrictions: No    Mobility  Bed Mobility Overal bed mobility: Modified Independent                Transfers Overall transfer level: Needs assistance Equipment used: None Transfers: Sit to/from Stand;Stand Pivot Transfers Sit to Stand: Min guard Stand pivot transfers: Min guard       General transfer comment: MIN/guard to transfer to [facility].  Impulsive at times  Ambulation/Gait Ambulation/Gait assistance: Min guard Ambulation Distance (Feet): 190 Feet Assistive device: None Gait Pattern/deviations: Step-through pattern;Decreased stride length;Trunk flexed Gait velocity: increased   General Gait Details: o2 on 3 L/min 88-90% with HR up to 129 bpm with 1 short standing rest break.  Pt with close min/guard, but no HHA   Stairs            Wheelchair Mobility    Modified Rankin (Stroke Patients Only)       Balance     Sitting balance-Leahy Scale: Good     Standing balance support: No upper extremity supported;During functional activity Standing balance-Leahy Scale: Fair                              Cognition Arousal/Alertness: Awake/alert Behavior During Therapy: Impulsive Overall Cognitive Status: Within Functional Limits for tasks assessed                                        Exercises      General Comments General comments (skin integrity, edema, etc.):  Standing balance activities in room with functional tasks      Pertinent Vitals/Pain      Home Living                      Prior Function            PT Goals (current goals can now be found in the care plan section) Acute Rehab PT Goals Patient Stated Goal: Home with dtr PT Goal Formulation: With patient Time For Goal Achievement: 10/13/16 Potential to Achieve Goals: Good Progress towards PT goals: Progressing toward goals    Frequency    Min 3X/week      PT Plan Current plan remains appropriate    Co-evaluation              AM-PAC PT "6 Clicks" Daily Activity  Outcome Measure  Difficulty turning over in bed (including adjusting bedclothes, sheets and blankets)?: None Difficulty moving from lying on back to sitting on the side of the bed? : A Little Difficulty sitting down on and standing up from a chair with arms (e.g., wheelchair, bedside commode, etc,.)?: A Little Help needed moving to and from a bed to chair (including a wheelchair)?: A Little Help needed walking in hospital  room?: A Little Help needed climbing 3-5 steps with a railing? : A Little 6 Click Score: 19    End of Session Equipment Utilized During Treatment: Oxygen Activity Tolerance: Patient tolerated treatment well Patient left: in chair;with call bell/phone within reach;with chair alarm set Nurse Communication: Mobility status PT Visit Diagnosis: Unsteadiness on feet (R26.81);Muscle weakness (generalized) (M62.81)     Time: 5427-0623 PT Time Calculation (min) (ACUTE ONLY): 25 min  Charges:  $Gait Training: 8-22 mins $Therapeutic Activity: 8-22 mins                    G Codes:       Fayth Trefry L. Tamala Julian, [place] Pager [phone] 10/04/2016    Galen Manila 10/04/2016, 2:34 PM

## 2016-10-04 NOTE — Progress Notes (Signed)
SLP Cancellation Note  Patient Details Name: Michelle Mccall MRN: [id number] DOB: 1940-05-31   Cancelled treatment:       Reason Eval/Treat Not Completed: Other (comment) (await results of esophagram prior to follow up, will continue efforts)   [name] 10/04/2016, 2:34 PM [name], [place] [facility] SLP [phone]

## 2016-10-04 NOTE — Progress Notes (Signed)
Will need Home O2 desaturation screen please.

## 2016-10-05 ENCOUNTER — Inpatient Hospital Stay (HOSPITAL_COMMUNITY): Payer: Medicaid Other

## 2016-10-05 LAB — BASIC METABOLIC PANEL
Anion gap: 6 (ref 5–15)
BUN: 18 mg/dL (ref 6–20)
CHLORIDE: 95 mmol/L — AB (ref 101–111)
CO2: 35 mmol/L — AB (ref 22–32)
CREATININE: 0.48 mg/dL (ref 0.44–1.00)
Calcium: 8.6 mg/dL — ABNORMAL LOW (ref 8.9–10.3)
GFR calc Af Amer: >60 mL/min (ref >60)
GFR calc non Af Amer: >60 mL/min (ref >60)
Glucose, Bld: 110 mg/dL — ABNORMAL HIGH (ref 65–99)
Potassium: 3.8 mmol/L (ref 3.5–5.1)
Sodium: 136 mmol/L (ref 135–145)

## 2016-10-05 LAB — CBC
HCT: 35.7 % — ABNORMAL LOW (ref 36.0–46.0)
Hemoglobin: 11.3 g/dL — ABNORMAL LOW (ref 12.0–15.0)
MCH: 26.7 pg (ref 26.0–34.0)
MCHC: 31.7 g/dL (ref 30.0–36.0)
MCV: 84.2 fL (ref 78.0–100.0)
Platelets: 311 10*3/uL (ref 150–400)
RBC: 4.24 MIL/uL (ref 3.87–5.11)
RDW: 14.5 % (ref 11.5–15.5)
WBC: 10.2 10*3/uL (ref 4.0–10.5)

## 2016-10-05 LAB — PROTIME-INR
INR: 1.21
Prothrombin Time: 15.4 seconds — ABNORMAL HIGH (ref 11.4–15.2)

## 2016-10-05 MED ORDER — LEVALBUTEROL HCL 1.25 MG/0.5ML IN NEBU
1.2500 mg | INHALATION_SOLUTION | Freq: Three times a day (TID) | RESPIRATORY_TRACT | Status: DC
Start: 2016-10-05 — End: 2016-10-16
  Administered 2016-10-05 – 2016-10-16 (×33): 1.25 mg via RESPIRATORY_TRACT
  Filled 2016-10-05 (×34): qty 0.5

## 2016-10-05 MED ORDER — IPRATROPIUM BROMIDE 0.02 % IN SOLN
0.5000 mg | Freq: Three times a day (TID) | RESPIRATORY_TRACT | Status: DC
Start: 2016-10-05 — End: 2016-10-07
  Administered 2016-10-05 – 2016-10-07 (×6): 0.5 mg via RESPIRATORY_TRACT
  Filled 2016-10-05 (×6): qty 2.5

## 2016-10-05 MED ORDER — NYSTATIN 100000 UNIT/GM EX POWD
Freq: Three times a day (TID) | CUTANEOUS | Status: DC
  Administered 2016-10-05 – 2016-10-15 (×33): via TOPICAL
  Filled 2016-10-05 (×2): qty 15

## 2016-10-05 MED ORDER — GUAIFENESIN ER 600 MG PO TB12
1200.0000 mg | ORAL_TABLET | Freq: Two times a day (BID) | ORAL | Status: DC
  Administered 2016-10-05 – 2016-10-16 (×23): 1200 mg via ORAL
  Filled 2016-10-05 (×23): qty 2

## 2016-10-05 MED ORDER — WARFARIN SODIUM 5 MG PO TABS
5.0000 mg | ORAL_TABLET | Freq: Once | ORAL | Status: AC
  Administered 2016-10-05: 5 mg via ORAL
  Filled 2016-10-05: qty 1

## 2016-10-05 NOTE — Progress Notes (Signed)
Physical Therapy Treatment Patient Details Name: Michelle Mccall MRN: [id number] DOB: 05/25/1940 Today's Date: 10/05/2016    History of Present Illness Pt admitted with dx sepsis 2* recurrent PNA    PT Comments    Assisted OOB to [facility] to void.  Pt able to self perform transfer and hygiene at [employer] Assist/Supervision.  Assisted with amb a greater distance in hallway on 3 lts nasal sats avg 89 - 90% with HR 116.  Mild dyspnea requiring one sitting rest break.  No AD needed.  No true LOB.  Good alternating gait with mild kyphotic posture.  Returned to room and positioned in recliner.    Follow Up Recommendations  No PT follow up     Equipment Recommendations  None recommended by PT    Recommendations for Other Services       Precautions / Restrictions Precautions Precautions: Fall Precaution Comments: monitor sats/likes to wear shoes/speaks [place] Restrictions Weight Bearing Restrictions: No    Mobility  Bed Mobility Overal bed mobility: Modified Independent             General bed mobility comments: Pt moves to EOB with increased time but unassisted  Transfers Overall transfer level: Needs assistance Equipment used: None Transfers: Sit to/from [facility] Sit to Stand: Supervision;Min guard Stand pivot transfers: Supervision;Min guard       General transfer comment: MIN/guard to transfer to [facility].  Impulsive at times but good use of hands to steady self  Ambulation/Gait Ambulation/Gait assistance: Min guard Ambulation Distance (Feet): 225 Feet Assistive device: None Gait Pattern/deviations: Step-through pattern;Decreased stride length;Trunk flexed Gait velocity: WFL   General Gait Details: o2 on 3 L/min 88-90% with HR up to 116 bpm with 1 short sitting rest break.  Pt with close min/guard, but no HHA   Stairs            Wheelchair Mobility    Modified Rankin (Stroke Patients Only)       Balance                                             Cognition Arousal/Alertness: Awake/alert Behavior During Therapy: Impulsive Overall Cognitive Status: Within Functional Limits for tasks assessed                                        Exercises      General Comments        Pertinent Vitals/Pain Pain Assessment: No/denies pain    Home Living                      Prior Function            PT Goals (current goals can now be found in the care plan section) Progress towards PT goals: Progressing toward goals    Frequency    Min 3X/week      PT Plan Current plan remains appropriate    Co-evaluation              AM-PAC PT "6 Clicks" Daily Activity  Outcome Measure  Difficulty turning over in bed (including adjusting bedclothes, sheets and blankets)?: None Difficulty moving from lying on back to sitting on the side of the bed? : A Little Difficulty sitting down on and standing up from  a chair with arms (e.g., wheelchair, bedside commode, etc,.)?: A Little Help needed moving to and from a bed to chair (including a wheelchair)?: A Little Help needed walking in hospital room?: A Little Help needed climbing 3-5 steps with a railing? : A Little 6 Click Score: 19    End of Session Equipment Utilized During Treatment: Oxygen Activity Tolerance: Patient tolerated treatment well Patient left: in chair;with call bell/phone within reach;with chair alarm set Nurse Communication: Mobility status PT Visit Diagnosis: Unsteadiness on feet (R26.81);Muscle weakness (generalized) (M62.81)     Time: 1660-6301 PT Time Calculation (min) (ACUTE ONLY): 30 min  Charges:  $Gait Training: 8-22 mins $Therapeutic Activity: 8-22 mins                    G Codes:       Rica Koyanagi  PTA [facility]  Acute  Rehab Pager      [phone]

## 2016-10-05 NOTE — Progress Notes (Signed)
SLP Cancellation Note  Patient Details Name: Michelle Mccall MRN: [id number] DOB: 27-Dec-1940   Cancelled treatment:        Esophagram reordered, will await results to follow up with patient..   Of note, flouro technologist who helped with esophagram yesterday reported pt did cough x1 during esophagram but was not coorelated to barium intake.   Will follow up.    [name] 10/05/2016, 3:42 PM

## 2016-10-05 NOTE — Progress Notes (Addendum)
Took over care for Pt at 0300, agree with previous RN assessment. Pt in stable condition, will continue to monitor.  Arville Lime, RN

## 2016-10-05 NOTE — Progress Notes (Signed)
PROGRESS NOTE  Michelle Mccall  CVE:[id number] DOB: 26-Apr-1940 DOA: 09/27/2016 PCP: No primary care provider on file.  Brief Narrative:   76 year old [place] female with history of dyslipidemia, unspecified tachyarrhythmia, multiple episodes of pneumonia in past 9 months, November and December 2017, April 2018. Patient transferred from [facility] with progressive shortness of breath at rest and on exertion. No fevers or chills. Patient apparently came back to [place] 09/13/2016 after traveling to [place] and apparently has had pneumonia while in [place] and she brought some medications with her but they were not helping relieve the symptoms. In ED, patient was afebrile, heart rate was 128, normal blood pressure and oxygen saturation was 94% on room air. She was started on broad-spectrum antibiotics while awaiting culture results. Chest x-ray showed left lower lobe and lingular pneumonia and calcified right lung granuloma. This was followed by CT chest with contrast which showed extensive consolidations of the left lower and upper lobes and infiltrate on the right middle lobe with a mild mediastinal adenopathy. When patient arrived to stepdown unit at the time of the admission, patient became tachycardic with pulse of 127, she was hypotensive with blood pressure of 79/50 despite at least 1.5 L normal saline bolus. Patient improved with nebulizer treatment and one dose of Solu-Medrol 125 mg IV, BiPAP.  Pt had increased work of breathing 8/18. CCM was consulted and has been assisting with care.  She continues to have copious clear frothy phlegm, frequent cough with dyspnea.  SLP feels she may have an esophageal dysphagia.  Esophogram was performed, but was an incomplete study.  Have reordered and will be done on 8/24.    Assessment & Plan:   Principal Problem:   Sepsis ([place]) Active Problems:   HCAP (healthcare-associated pneumonia)   Hypokalemia   Anemia   Hypoalbuminemia   Tachyarrhythmia  Hyperlipidemia   Acute respiratory failure (HCC)   Abnormal EKG  Sepsis (HCC) and acute respiratory failure with hypoxia secondary to multilobar HCAP with near complete white-out of left lung - Stopped vanco 8/18 - Influenza PCR negative - Blood cx negative - Resp cx negative - Continue cefepime day 8 of 10 of antibiotics - Pulmonary following  - repeat CT as outpatient once treatment completed -  Scar over left posterior ribs was due to distant surgery that required harvesting a rib bone for a back surgery.  Frothy sputum  -  Resume guaifenesin -  D/c lasix today as BUN and creatinine are rising slightly  Dysphagia -  Appreciate SLP assistance -  Esophogram needs to be repeated, study yesterday was incomplete  Hypokalemia / Hyperkalemia  - Related to sepsis - Potassium WNL  Hypotension, likely due to combination of lasix and resuming diltiazem -  Continue dilt -  Hold on lasix today and reevaluate in AM  Still having episodic SVT with known history of tachyarrhythmia despite starting diltiazem -  Resume diltiazem now that BP improved.  Hold for SBP < 100 - Continue coumadin  -  Continue telemetry  Normocytic anemia - Hgb stable at 10.7  DVT prophylaxis:  warfarin Code Status:   Full code Family Communication:  Patient with ipad interpreter  Disposition Plan:  PT/OT recommending no follow up.  Down to 3L [place] today.  Ambulatory pulse ox tomorrow.  Repeat esophagram tomorrow.  Discharge in 1-2 days.    Consultants:   PCCM  Procedures:  none  Antimicrobials:  Anti-infectives    Start     Dose/Rate Route Frequency Ordered Stop  10/01/16 1200  ceFEPIme (MAXIPIME) 1 g in dextrose 5 % 50 mL IVPB     1 g 100 mL/hr over 30 Minutes Intravenous Every 12 hours 10/01/16 1028     09/28/16 2100  vancomycin (VANCOCIN) IVPB 750 mg/150 ml premix  Status:  Discontinued     750 mg 150 mL/hr over 60 Minutes Intravenous Every 24 hours 09/27/16 2102 09/30/16 0647    09/28/16 2100  ceFEPIme (MAXIPIME) 1 g in dextrose 5 % 50 mL IVPB  Status:  Discontinued     1 g 100 mL/hr over 30 Minutes Intravenous Every 24 hours 09/27/16 2104 10/01/16 1028   09/27/16 2200  ceFEPIme (MAXIPIME) 1 g in dextrose 5 % 50 mL IVPB  Status:  Discontinued     1 g 100 mL/hr over 30 Minutes Intravenous Every 8 hours 09/27/16 2050 09/27/16 2104   09/27/16 2100  vancomycin (VANCOCIN) IVPB 1000 mg/200 mL premix     1,000 mg 200 mL/hr over 60 Minutes Intravenous  Once 09/27/16 2050 09/27/16 2245   09/27/16 2057  ceFEPIme (MAXIPIME) 1 g injection    Comments:  Woodward Ku   : cabinet override      09/27/16 2057 09/28/16 0859       Subjective:  Cough with copious thin clear secretions/frothy, secretions are thicker today.  Overall feels much better since admission, but still having frequent cough and frustrated that she cannot seem to clear this infection.    Objective: Vitals:   10/05/16 0521 10/05/16 0758 10/05/16 1347 10/05/16 1358  BP: 115/76  100/65   Pulse: 88  79 78  Resp: 18  18 18   Temp: 98.5 F (36.9 C)  98.3 F (36.8 C)   TempSrc: Oral  Oral   SpO2: 94% 94% 94% 94%  Weight:      Height:        Intake/Output Summary (Last 24 hours) at 10/05/16 1722 Last data filed at 10/05/16 1400  Gross per 24 hour  Intake              100 ml  Output             1500 ml  Net            -1400 ml   Filed Weights   09/28/16 0057 09/30/16 0421 10/04/16 1037  Weight: 53.6 kg (118 lb 2.7 oz) 60.2 kg (132 lb 11.5 oz) 59.8 kg (131 lb 13.4 oz)    Examination:  General exam:  Adult female, sitting upright, coughing up copious clear phlegm every few minutes during my interview.  No acute distress.  HEENT:  NCAT, MMM Respiratory system:  Still diminished throughout the left lobes.  Rales heard throughout lower left lung fields.  + rhonchi.  Decreased wheeze Cardiovascular system: Regular rate and rhythm, normal S1/S2. No murmurs, rubs, gallops or clicks.  Warm  extremities Gastrointestinal system: Normal active bowel sounds, soft, nondistended, nontender. MSK:  Normal tone and bulk, no lower extremity edema Neuro:  Grossly intact       Data Reviewed: I have personally reviewed following labs and imaging studies  CBC:  Recent Labs Lab 09/29/16 0249 09/30/16 0259 10/01/16 0421 10/02/16 0329 10/03/16 0259 10/04/16 0427 10/05/16 0425  WBC 11.4* 10.8* 11.3* 11.5* 11.5* 9.8 10.2  NEUTROABS 7.6 6.8 9.9*  --   --   --   --   HGB 11.0* 11.4* 12.3 10.6* 10.7* 12.1 11.3*  HCT 33.5* 35.7* 38.2 33.2* 33.4* 38.6 35.7*  MCV 84.8 86.7  86.0 86.7 87.0 86.0 84.2  PLT 249 287 306 255 273 349 413   Basic Metabolic Panel:  Recent Labs Lab 10/01/16 0421 10/02/16 0329 10/03/16 0259 10/04/16 0427 10/05/16 0425  NA 138 135 138 139 136  K 4.6 5.2* 4.8 3.8 3.8  CL 103 103 103 99* 95*  CO2 28 25 31  36* 35*  GLUCOSE 182* 206* 133* 89 110*  BUN 14 15 17 15 18   CREATININE 0.61 0.50 0.36* 0.45 0.48  CALCIUM 9.1 9.0 9.1 8.7* 8.6*   GFR: Estimated Creatinine Clearance: 48.9 mL/min (by C-G formula based on SCr of 0.48 mg/dL). Liver Function Tests: No results for input(s): AST, ALT, ALKPHOS, BILITOT, PROT, ALBUMIN in the last 168 hours. No results for input(s): LIPASE, AMYLASE in the last 168 hours. No results for input(s): AMMONIA in the last 168 hours. Coagulation Profile:  Recent Labs Lab 10/01/16 0421 10/02/16 0329 10/03/16 0259 10/04/16 0427 10/05/16 0425  INR 1.82 2.61 2.23 1.39 1.21   Cardiac Enzymes: No results for input(s): CKTOTAL, CKMB, CKMBINDEX, TROPONINI in the last 168 hours. BNP (last 3 results) No results for input(s): PROBNP in the last 8760 hours. HbA1C: No results for input(s): HGBA1C in the last 72 hours. CBG: No results for input(s): GLUCAP in the last 168 hours. Lipid Profile: No results for input(s): CHOL, HDL, LDLCALC, TRIG, CHOLHDL, LDLDIRECT in the last 72 hours. Thyroid Function Tests: No results for  input(s): TSH, T4TOTAL, FREET4, T3FREE, THYROIDAB in the last 72 hours. Anemia Panel: No results for input(s): VITAMINB12, FOLATE, FERRITIN, TIBC, IRON, RETICCTPCT in the last 72 hours. Urine analysis: No results found for: COLORURINE, APPEARANCEUR, LABSPEC, PHURINE, GLUCOSEU, HGBUR, BILIRUBINUR, KETONESUR, PROTEINUR, UROBILINOGEN, NITRITE, LEUKOCYTESUR Sepsis Labs: @LABRCNTIP (procalcitonin:4,lacticidven:4)  ) Recent Results (from the past 240 hour(s))  Blood culture (routine x 2)     Status: None   Collection Time: 09/27/16  6:50 PM  Result Value Ref Range Status   Specimen Description BLOOD RIGHT ANTECUBITAL  Final   Special Requests Blood Culture adequate volume IN PEDIATRIC BOTTLE  Final   Culture   Final    NO GROWTH 5 DAYS Performed at [facility], [address], [place], [place] [address]    Report Status 10/02/2016 FINAL  Final  Blood culture (routine x 2)     Status: None   Collection Time: 09/27/16  7:05 PM  Result Value Ref Range Status   Specimen Description BLOOD BLOOD LEFT ARM  Final   Special Requests   Final    Blood Culture adequate volume BOTTLES DRAWN AEROBIC AND ANAEROBIC   Culture   Final    NO GROWTH 5 DAYS Performed at [facility], [place] [address], [place], [place] [address]    Report Status 10/02/2016 FINAL  Final  MRSA PCR Screening     Status: None   Collection Time: 09/28/16 12:56 AM  Result Value Ref Range Status   MRSA by PCR NEGATIVE NEGATIVE Final    Comment:        The GeneXpert MRSA Assay (FDA approved for NASAL specimens only), is one component of a comprehensive MRSA colonization surveillance program. It is not intended to diagnose MRSA infection nor to guide or monitor treatment for MRSA infections.   Culture, sputum-assessment     Status: None   Collection Time: 09/28/16  4:37 AM  Result Value Ref Range Status   Specimen Description SPUTUM  Final   Special Requests Normal  Final   Sputum evaluation THIS SPECIMEN IS  ACCEPTABLE FOR SPUTUM CULTURE  Final   Report Status 09/28/2016 FINAL  Final  Culture, respiratory (NON-Expectorated)     Status: None   Collection Time: 09/28/16  4:37 AM  Result Value Ref Range Status   Specimen Description SPUTUM  Final   Special Requests Normal Reflexed from [id number]  Final   Gram Stain NO WBC SEEN NO ORGANISMS SEEN   Final   Culture   Final    Consistent with normal respiratory flora. Performed at [facility], [place] [address], [place], [place] [address]    Report Status 09/30/2016 FINAL  Final      Radiology Studies: Dg Esophagus  Result Date: 10/04/2016 CLINICAL DATA:  Coughing while drinking liquids. Evaluate for aspiration. EXAM: ESOPHOGRAM/BARIUM SWALLOW TECHNIQUE: Single contrast examination was performed using  thin barium. FLUOROSCOPY TIME:  Fluoroscopy Time:  0.7 minutes Radiation Exposure Index (if provided by the fluoroscopic device): 5.5 mGy. Number of Acquired Spot Images: 0 COMPARISON:  None. FINDINGS: Right anterior oblique and right lateral decubitus views during swallowing demonstrate no evidence of aspiration or significant penetration. IMPRESSION: No evidence of aspiration. Electronically Signed   By: Titus Dubin M.D.   On: 10/04/2016 12:31   Dg Chest Port 1 View  Result Date: 10/05/2016 CLINICAL DATA:  Follow-up shortness of breath and pneumonia EXAM: PORTABLE CHEST 1 VIEW COMPARISON:  10/03/2016 FINDINGS: Cardiac shadow is stable but somewhat obscured due to significant left-sided infiltrate which is also stable from the prior exam. The right lung demonstrates minimal basilar atelectasis. No bony abnormality is seen. IMPRESSION: Overall stable appearing left lung infiltrate. Mild right basilar atelectasis is noted. Electronically Signed   By: Inez Catalina M.D.   On: 10/05/2016 08:19     Scheduled Meds: . acidophilus  1 capsule Oral Daily  . aspirin  81 mg Oral Daily  . diltiazem  180 mg Oral Daily  . famotidine  20 mg Oral BID  .  guaiFENesin  1,200 mg Oral BID  . ipratropium  0.5 mg Nebulization TID  . levalbuterol  1.25 mg Nebulization TID  . mouth rinse  15 mL Mouth Rinse BID  . nystatin   Topical TID  . nystatin cream   Topical BID  . warfarin  5 mg Oral ONCE-1800  . Warfarin - Pharmacist Dosing Inpatient   Does not apply q1800   Continuous Infusions: . ceFEPime (MAXIPIME) IV Stopped (10/05/16 1348)     LOS: 8 days    Time spent: 30 min    Janece Canterbury, MD [facility] Pager [phone]  If 7PM-7AM, please contact night-coverage [email or web address] Password [email or web address] 10/05/2016, 5:22 PM

## 2016-10-05 NOTE — Progress Notes (Signed)
Took over care of patient at 1500; agree with previous RN physical assessment. Will continue to monitor and provide care for patient.  Carmela Hurt, RN

## 2016-10-05 NOTE — Progress Notes (Signed)
ANTICOAGULATION CONSULT NOTE / Antibiotic Note  Pharmacy Consult for Coumadin Indication: unspecified tachycardia  No Known Allergies  Patient Measurements: Height: 5\' 1"  (154.9 cm) Weight:  (118.5lb) IBW/kg (Calculated) : 47.8  Vital Signs: Temp: 98.5 F (36.9 C) (08/23 0521) Temp Source: Oral (08/23 0521) BP: 115/76 (08/23 0521) Pulse Rate: 88 (08/23 0521)  Labs:  Recent Labs  10/03/16 0259 10/04/16 0427 10/05/16 0425  HGB 10.7* 12.1 11.3*  HCT 33.4* 38.6 35.7*  PLT 273 349 311  LABPROT 25.1* 17.1* 15.4*  INR 2.23 1.39 1.21  CREATININE 0.36* 0.45 0.48    Estimated Creatinine Clearance: 48.9 mL/min (by C-G formula based on SCr of 0.48 mg/dL).   Medical History: Past Medical History:  Diagnosis Date  . Hyperlipidemia   . Pneumonia   . Tachyarrhythmia     Medications:  Scheduled:  . acidophilus  1 capsule Oral Daily  . aspirin  81 mg Oral Daily  . diltiazem  180 mg Oral Daily  . famotidine  20 mg Oral BID  . ipratropium  0.5 mg Nebulization QID  . levalbuterol  1.25 mg Nebulization QID  . mouth rinse  15 mL Mouth Rinse BID  . nystatin cream   Topical BID  . Warfarin - Pharmacist Dosing Inpatient   Does not apply q1800   Infusions:  . ceFEPime (MAXIPIME) IV Stopped (10/04/16 2338)   PRN: ipratropium, levalbuterol, metoprolol tartrate, ondansetron (ZOFRAN) IV   Antimicrobials this admission:  Vancomycin 09/27/2016 >> 8/18 Cefepime 09/27/2016 >>   Dose adjustments this admission:  8/19 Increase cefepime from 1g IV q24h to 1g IV q12h  Microbiology results:  8/15 BCx: ngf 8/16 Urine strep: neg 8/16 Urine legionella: neg 8/16 Sputum: no organisms seen, cx c/w normal flora (F)  8/16 MRSA PCR: neg 8/17 Influenza panel: neg   Assessment: 76 yo female admitted with pneumonia. Her daughter reports that she is taking warfarin 5mg  daily for an abnormal heart rhythm but has not taken it in the past week. She was unable to say what physician monitors the  INR.  INR is subtherapeutic at 0.92. Pharmacy is consulted to resume dosing now.  10/05/2016:  INR decreased to 1.21, decreasing INR in response to lower doses provided this week.  Patient's INR had a brisk response to 7.5 mg dose so lower doses were subsequently used to avoid supratherapeutic INR.  CBC: Hgb 11.3, Pltc WNL  No bleeding reported  Regular diet ordered, eating meals  Drug-drug interactions- no major DDI.  Broad-spectrum antibiotics can increase INR.   SCr low, stable. CrCl 49 ml/min  Goal of Therapy:  INR 2-3 Monitor platelets by anticoagulation protocol: Yes  Plan:   Warfarin 5mg  PO x 1 today  Daily PT/INR  Hershal Coria, PharmD, BCPS Pager: [phone] 10/05/2016 8:14 AM

## 2016-10-06 ENCOUNTER — Inpatient Hospital Stay (HOSPITAL_COMMUNITY): Payer: Medicaid Other

## 2016-10-06 LAB — BASIC METABOLIC PANEL
Anion gap: 5 (ref 5–15)
BUN: 16 mg/dL (ref 6–20)
CALCIUM: 8.8 mg/dL — AB (ref 8.9–10.3)
CO2: 33 mmol/L — ABNORMAL HIGH (ref 22–32)
CREATININE: 0.45 mg/dL (ref 0.44–1.00)
Chloride: 100 mmol/L — ABNORMAL LOW (ref 101–111)
Glucose, Bld: 88 mg/dL (ref 65–99)
Potassium: 4.5 mmol/L (ref 3.5–5.1)
SODIUM: 138 mmol/L (ref 135–145)

## 2016-10-06 LAB — CBC
HCT: 37.8 % (ref 36.0–46.0)
Hemoglobin: 12.2 g/dL (ref 12.0–15.0)
MCH: 27.7 pg (ref 26.0–34.0)
MCHC: 32.3 g/dL (ref 30.0–36.0)
MCV: 85.7 fL (ref 78.0–100.0)
PLATELETS: 353 10*3/uL (ref 150–400)
RBC: 4.41 MIL/uL (ref 3.87–5.11)
RDW: 14.8 % (ref 11.5–15.5)
WBC: 10.6 10*3/uL — AB (ref 4.0–10.5)

## 2016-10-06 LAB — PROTIME-INR
INR: 1.35
Prothrombin Time: 16.7 seconds — ABNORMAL HIGH (ref 11.4–15.2)

## 2016-10-06 MED ORDER — TRAZODONE HCL 50 MG PO TABS
50.0000 mg | ORAL_TABLET | Freq: Once | ORAL | Status: AC
  Administered 2016-10-06: 50 mg via ORAL
  Filled 2016-10-06: qty 1

## 2016-10-06 MED ORDER — WARFARIN SODIUM 2.5 MG PO TABS
7.5000 mg | ORAL_TABLET | Freq: Once | ORAL | Status: AC
  Administered 2016-10-06: 7.5 mg via ORAL
  Filled 2016-10-06: qty 1

## 2016-10-06 MED ORDER — FUROSEMIDE 10 MG/ML IJ SOLN
40.0000 mg | Freq: Once | INTRAMUSCULAR | Status: AC
  Administered 2016-10-06: 40 mg via INTRAVENOUS
  Filled 2016-10-06: qty 4

## 2016-10-06 MED ORDER — PREDNISONE 20 MG PO TABS
40.0000 mg | ORAL_TABLET | Freq: Every day | ORAL | Status: DC
  Administered 2016-10-06: 40 mg via ORAL

## 2016-10-06 MED ORDER — BENZONATATE 100 MG PO CAPS
200.0000 mg | ORAL_CAPSULE | Freq: Once | ORAL | Status: AC
  Administered 2016-10-06: 200 mg via ORAL
  Filled 2016-10-06: qty 2

## 2016-10-06 NOTE — Progress Notes (Signed)
Nutrition Brief Note  Patient identified as extended length of stay. Spoke briefly with RN who reports pt's appetite is great and wt is stable.   Wt Readings from Last 15 Encounters:  10/06/16 124 lb 9 oz (56.5 kg)    Body mass index is 23.54 kg/m. Patient meets criteria for overweight based on current BMI.   Current diet order is Heart Healthy, patient is consuming approximately 95% of meals at this time. Labs and medications reviewed.   No nutrition interventions warranted at this time. If nutrition issues arise, please consult RD.   Mariana Single RD, LDN Clinical Nutrition Pager # [phone]

## 2016-10-06 NOTE — Progress Notes (Signed)
Handoff report given to Lorri Frederick., RN.

## 2016-10-06 NOTE — Progress Notes (Signed)
SLP Cancellation Note  Patient Details Name: Michelle Mccall MRN: [id number] DOB: Mar 26, 1940   Cancelled treatment:       Reason Eval/Treat Not Completed: Other (comment) (pt for esophagram today, not completed at this time, will continue efforts to see after esophagram)   [name] 10/06/2016, 12:49 PM [name], [place] [facility] SLP [phone]

## 2016-10-06 NOTE — Progress Notes (Signed)
SATURATION QUALIFICATIONS: (This note is used to comply with regulatory documentation for home oxygen)  Patient Saturations on Room Air at Rest = 84%  Patient Saturations on 3 Liters of oxygen at Rest = 94%  Please briefly explain why patient needs home oxygen:

## 2016-10-06 NOTE — Progress Notes (Signed)
[place] for Coumadin Indication: unspecified tachycardia  No Known Allergies  Patient Measurements: Height: 5\' 1"  (154.9 cm) Weight: 124 lb 9 oz (56.5 kg) IBW/kg (Calculated) : 47.8  Vital Signs: Temp: 97.7 F (36.5 C) (08/24 0602) Temp Source: Oral (08/24 0602) BP: 110/63 (08/24 0602) Pulse Rate: 86 (08/24 0602)  Labs:  Recent Labs  10/04/16 0427 10/05/16 0425 10/06/16 0407  HGB 12.1 11.3* 12.2  HCT 38.6 35.7* 37.8  PLT 349 311 353  LABPROT 17.1* 15.4* 16.7*  INR 1.39 1.21 1.35  CREATININE 0.45 0.48 0.45    Estimated Creatinine Clearance: 44.4 mL/min (by C-G formula based on SCr of 0.45 mg/dL).   Medical History: Past Medical History:  Diagnosis Date  . Hyperlipidemia   . Pneumonia   . Tachyarrhythmia     Medications:  Scheduled:  . acidophilus  1 capsule Oral Daily  . aspirin  81 mg Oral Daily  . diltiazem  180 mg Oral Daily  . famotidine  20 mg Oral BID  . guaiFENesin  1,200 mg Oral BID  . ipratropium  0.5 mg Nebulization TID  . levalbuterol  1.25 mg Nebulization TID  . mouth rinse  15 mL Mouth Rinse BID  . nystatin   Topical TID  . nystatin cream   Topical BID  . warfarin  7.5 mg Oral Once  . Warfarin - Pharmacist Dosing Inpatient   Does not apply q1800   Infusions:  . ceFEPime (MAXIPIME) IV Stopped (10/06/16 0115)   PRN: ipratropium, levalbuterol, metoprolol tartrate, ondansetron (ZOFRAN) IV    Assessment: 76 yo female admitted with pneumonia. Her daughter reports that she is taking warfarin 5mg  daily for an abnormal heart rhythm but has not taken it in the past week. She was unable to say what physician monitors the INR.  INR is subtherapeutic at 0.92. Pharmacy is consulted to resume dosing now.  10/06/2016:  INR subtherapeutic, but increased to 1.35.   CBC: Hgb, Pltc WNL  No bleeding reported  Cardiac diet ordered  Drug-drug interactions- no major DDI.  Broad-spectrum antibiotics can increase  INR.   SCr stable. CrCl 44 ml/min  Goal of Therapy:  INR 2-3 Monitor platelets by anticoagulation protocol: Yes  Plan:   Warfarin 7.5mg  PO x 1 today  Daily PT/INR  Consider stopping aspirin with concurrent warfarin therapy   Lindell Spar, PharmD, BCPS Pager: [phone] 10/06/2016 8:32 AM

## 2016-10-06 NOTE — Progress Notes (Signed)
Report received from Sandria Senter., RN. Patient is alert and oriented, resting in bed comfortably. Will continue to monitor

## 2016-10-06 NOTE — Progress Notes (Signed)
PROGRESS NOTE  Michelle Mccall  TDH:[id number] DOB: January 03, 1941 DOA: 09/27/2016 PCP: No primary care provider on file.  Brief Narrative:   76 year old [place] female with history of dyslipidemia, unspecified tachyarrhythmia, multiple episodes of pneumonia in past 9 months, November and December 2017, April 2018. Patient transferred from [facility] with progressive shortness of breath at rest and on exertion. No fevers or chills. Patient apparently came back to [place] 09/13/2016 after traveling to [place] and apparently has had pneumonia while in [place] and she brought some medications with her but they were not helping relieve the symptoms. In ED, patient was afebrile, heart rate was 128, normal blood pressure and oxygen saturation was 94% on room air. She was started on broad-spectrum antibiotics while awaiting culture results. Chest x-ray showed left lower lobe and lingular pneumonia and calcified right lung granuloma. This was followed by CT chest with contrast which showed extensive consolidations of the left lower and upper lobes and infiltrate on the right middle lobe with a mild mediastinal adenopathy. When patient arrived to stepdown unit at the time of the admission, patient became tachycardic with pulse of 127, she was hypotensive with blood pressure of 79/50 despite at least 1.5 L normal saline bolus. Patient improved with nebulizer treatment and one dose of Solu-Medrol 125 mg IV, BiPAP.  Pt had increased work of breathing 8/18. CCM was consulted and has been assisting with care.  She continues to have copious clear frothy phlegm, frequent cough with dyspnea.  SLP feels she may have an esophageal dysphagia.  Esophogram was performed, but was an incomplete study.  Have reordered and will be done on 8/24.    Assessment & Plan:   Principal Problem:   Sepsis ([place]) Active Problems:   HCAP (healthcare-associated pneumonia)   Hypokalemia   Anemia   Hypoalbuminemia   Tachyarrhythmia  Hyperlipidemia   Acute respiratory failure (HCC)   Abnormal EKG  Persistent acute respiratory failure with hypoxia with near white-out of the left lung.  Patient is still using accessory muscles, having intermittent dyspnea, copious frothy sputum despite 10-days of IV antibiotics, a course of steroids, and diuresis to the point of hypotension.  She was afebrile, without leukocytosis at admission and her procalcitonin was negative, suggesting this may not be typical bacterial pneumonia.     -  D/c antibiotics - Influenza PCR negative - Blood cx negative - Resp cx negative - repeat sputum culture with AFB, fungal culture, cytology - Pulmonary to please reconsult to assist with management (Scar over left posterior ribs was due to distant surgery that required harvesting a rib bone for a back surgery.)  Frothy sputum and plump vasculatory on CXR today -  Resume guaifenesin -  Lasix 40mg  IV once again today >> hypotensive again this afternoon  Dysphagia -  Appreciate SLP assistance:  Regular diet with thin liquids -  Patient declines Esophogram   Hypokalemia / Hyperkalemia  - Related to sepsis - Potassium WNL  Hypotension, likely due to combination of lasix and resuming diltiazem -  Continue dilt -  Hold on lasix today and reevaluate in AM  Still having episodic SVT with known history of tachyarrhythmia despite starting diltiazem -  Resume diltiazem now that BP improved.  Hold for SBP < 100 -  Continue coumadin -  Continue telemetry  Normocytic anemia - Hgb stable at 10.7  DVT prophylaxis:  Warfarin/SCDs Code Status:   Full code Family Communication:  Patient with ipad interpreter  Disposition Plan:  PT/OT recommending no follow up.  Down to 3L [place], but with increased WOB today/wheezing.  Patient should be better by now if this were typical infection.  Reconsult pulmonology for assistance.     Consultants:   PCCM  Procedures:  none  Antimicrobials:  Anti-infectives     Start     Dose/Rate Route Frequency Ordered Stop   10/01/16 1200  ceFEPIme (MAXIPIME) 1 g in dextrose 5 % 50 mL IVPB     1 g 100 mL/hr over 30 Minutes Intravenous Every 12 hours 10/01/16 1028     09/28/16 2100  vancomycin (VANCOCIN) IVPB 750 mg/150 ml premix  Status:  Discontinued     750 mg 150 mL/hr over 60 Minutes Intravenous Every 24 hours 09/27/16 2102 09/30/16 0647   09/28/16 2100  ceFEPIme (MAXIPIME) 1 g in dextrose 5 % 50 mL IVPB  Status:  Discontinued     1 g 100 mL/hr over 30 Minutes Intravenous Every 24 hours 09/27/16 2104 10/01/16 1028   09/27/16 2200  ceFEPIme (MAXIPIME) 1 g in dextrose 5 % 50 mL IVPB  Status:  Discontinued     1 g 100 mL/hr over 30 Minutes Intravenous Every 8 hours 09/27/16 2050 09/27/16 2104   09/27/16 2100  vancomycin (VANCOCIN) IVPB 1000 mg/200 mL premix     1,000 mg 200 mL/hr over 60 Minutes Intravenous  Once 09/27/16 2050 09/27/16 2245   09/27/16 2057  ceFEPIme (MAXIPIME) 1 g injection    Comments:  Woodward Ku   : cabinet override      09/27/16 2057 09/28/16 0859       Subjective:  Increased dyspnea today with wheezing, orthopnea.  Still coughing up cups and cups of frothy clear sputum.  Poor appetite related to difficulty breathing.    Objective: Vitals:   10/06/16 0837 10/06/16 1005 10/06/16 1400 10/06/16 1523  BP:  106/68 (!) 87/61   Pulse:  90 84   Resp:      Temp:   98.5 F (36.9 C)   TempSrc:   Oral   SpO2: 92% 94% 94% 92%  Weight:      Height:        Intake/Output Summary (Last 24 hours) at 10/06/16 1745 Last data filed at 10/06/16 1056  Gross per 24 hour  Intake                0 ml  Output              850 ml  Net             -850 ml   Filed Weights   09/30/16 0421 10/04/16 1037 10/06/16 0602  Weight: 60.2 kg (132 lb 11.5 oz) 59.8 kg (131 lb 13.4 oz) 56.5 kg (124 lb 9 oz)    Examination:  General exam:  Adult female, sitting upright, mild respiratory distress (SCM, subcostal retractions) coughing up copious  clear phlegm every few minutes during my interview.  HEENT:  NCAT, MMM Respiratory system:  Still diminished throughout the left lobes with course rales, wheezing throughout and + rhonchi.  Cardiovascular system: Regular rate and rhythm, normal S1/S2. No murmurs, rubs, gallops or clicks.  Warm extremities Gastrointestinal system: Normal active bowel sounds, soft, nondistended, nontender. MSK:  Normal tone and bulk, no lower extremity edema Neuro:  Grossly intact       Data Reviewed: I have personally reviewed following labs and imaging studies  CBC:  Recent Labs Lab 09/30/16 0259 10/01/16 0421 10/02/16 7741 10/03/16 2878 10/04/16 6767 10/05/16 0425 10/06/16 0407  WBC 10.8* 11.3* 11.5* 11.5* 9.8 10.2 10.6*  NEUTROABS 6.8 9.9*  --   --   --   --   --   HGB 11.4* 12.3 10.6* 10.7* 12.1 11.3* 12.2  HCT 35.7* 38.2 33.2* 33.4* 38.6 35.7* 37.8  MCV 86.7 86.0 86.7 87.0 86.0 84.2 85.7  PLT 287 306 255 273 349 311 097   Basic Metabolic Panel:  Recent Labs Lab 10/02/16 0329 10/03/16 0259 10/04/16 0427 10/05/16 0425 10/06/16 0407  NA 135 138 139 136 138  K 5.2* 4.8 3.8 3.8 4.5  CL 103 103 99* 95* 100*  CO2 25 31 36* 35* 33*  GLUCOSE 206* 133* 89 110* 88  BUN 15 17 15 18 16   CREATININE 0.50 0.36* 0.45 0.48 0.45  CALCIUM 9.0 9.1 8.7* 8.6* 8.8*   GFR: Estimated Creatinine Clearance: 44.4 mL/min (by C-G formula based on SCr of 0.45 mg/dL). Liver Function Tests: No results for input(s): AST, ALT, ALKPHOS, BILITOT, PROT, ALBUMIN in the last 168 hours. No results for input(s): LIPASE, AMYLASE in the last 168 hours. No results for input(s): AMMONIA in the last 168 hours. Coagulation Profile:  Recent Labs Lab 10/02/16 0329 10/03/16 0259 10/04/16 0427 10/05/16 0425 10/06/16 0407  INR 2.61 2.23 1.39 1.21 1.35   Cardiac Enzymes: No results for input(s): CKTOTAL, CKMB, CKMBINDEX, TROPONINI in the last 168 hours. BNP (last 3 results) No results for input(s): PROBNP in the  last 8760 hours. HbA1C: No results for input(s): HGBA1C in the last 72 hours. CBG: No results for input(s): GLUCAP in the last 168 hours. Lipid Profile: No results for input(s): CHOL, HDL, LDLCALC, TRIG, CHOLHDL, LDLDIRECT in the last 72 hours. Thyroid Function Tests: No results for input(s): TSH, T4TOTAL, FREET4, T3FREE, THYROIDAB in the last 72 hours. Anemia Panel: No results for input(s): VITAMINB12, FOLATE, FERRITIN, TIBC, IRON, RETICCTPCT in the last 72 hours. Urine analysis: No results found for: COLORURINE, APPEARANCEUR, LABSPEC, PHURINE, GLUCOSEU, HGBUR, BILIRUBINUR, KETONESUR, PROTEINUR, UROBILINOGEN, NITRITE, LEUKOCYTESUR Sepsis Labs: @LABRCNTIP (procalcitonin:4,lacticidven:4)  ) Recent Results (from the past 240 hour(s))  Blood culture (routine x 2)     Status: None   Collection Time: 09/27/16  6:50 PM  Result Value Ref Range Status   Specimen Description BLOOD RIGHT ANTECUBITAL  Final   Special Requests Blood Culture adequate volume IN PEDIATRIC BOTTLE  Final   Culture   Final    NO GROWTH 5 DAYS Performed at [facility], [place] [address] [place], [place] [address]    Report Status 10/02/2016 FINAL  Final  Blood culture (routine x 2)     Status: None   Collection Time: 09/27/16  7:05 PM  Result Value Ref Range Status   Specimen Description BLOOD BLOOD LEFT ARM  Final   Special Requests   Final    Blood Culture adequate volume BOTTLES DRAWN AEROBIC AND ANAEROBIC   Culture   Final    NO GROWTH 5 DAYS Performed at [facility], [place] [address] [place], [place] [address]    Report Status 10/02/2016 FINAL  Final  MRSA PCR Screening     Status: None   Collection Time: 09/28/16 12:56 AM  Result Value Ref Range Status   MRSA by PCR NEGATIVE NEGATIVE Final    Comment:        The GeneXpert MRSA Assay (FDA approved for NASAL specimens only), is one component of a comprehensive MRSA colonization surveillance program. It is not intended to diagnose  MRSA infection nor to guide or  monitor treatment for MRSA infections.   Culture, sputum-assessment     Status: None   Collection Time: 09/28/16  4:37 AM  Result Value Ref Range Status   Specimen Description SPUTUM  Final   Special Requests Normal  Final   Sputum evaluation THIS SPECIMEN IS ACCEPTABLE FOR SPUTUM CULTURE  Final   Report Status 09/28/2016 FINAL  Final  Culture, respiratory (NON-Expectorated)     Status: None   Collection Time: 09/28/16  4:37 AM  Result Value Ref Range Status   Specimen Description SPUTUM  Final   Special Requests Normal Reflexed from [id number]  Final   Gram Stain NO WBC SEEN NO ORGANISMS SEEN   Final   Culture   Final    Consistent with normal respiratory flora. Performed at [facility], [place] [address] [place], [place] [address]    Report Status 09/30/2016 FINAL  Final      Radiology Studies: Dg Chest Port 1 View  Result Date: 10/06/2016 CLINICAL DATA:  Dyspnea. EXAM: PORTABLE CHEST 1 VIEW COMPARISON:  Single-view of the chest 10/05/2016 and 10/03/2016. PA and lateral chest 09/27/2016. CT chest 09/27/2016. FINDINGS: Extensive airspace disease throughout almost the entire left chest and a left pleural effusion persist without marked change since the 09/27/2016 study. There is a trace right pleural effusion and mild right basilar airspace disease. Calcified granuloma on the right is noted. IMPRESSION: No change in extensive airspace disease left chest and left effusion compatible with pneumonia. Electronically Signed   By: Inge Rise M.D.   On: 10/06/2016 10:18   Dg Chest Port 1 View  Result Date: 10/05/2016 CLINICAL DATA:  Follow-up shortness of breath and pneumonia EXAM: PORTABLE CHEST 1 VIEW COMPARISON:  10/03/2016 FINDINGS: Cardiac shadow is stable but somewhat obscured due to significant left-sided infiltrate which is also stable from the prior exam. The right lung demonstrates minimal basilar atelectasis. No bony abnormality is seen.  IMPRESSION: Overall stable appearing left lung infiltrate. Mild right basilar atelectasis is noted. Electronically Signed   By: Inez Catalina M.D.   On: 10/05/2016 08:19     Scheduled Meds: . acidophilus  1 capsule Oral Daily  . aspirin  81 mg Oral Daily  . diltiazem  180 mg Oral Daily  . famotidine  20 mg Oral BID  . guaiFENesin  1,200 mg Oral BID  . ipratropium  0.5 mg Nebulization TID  . levalbuterol  1.25 mg Nebulization TID  . mouth rinse  15 mL Mouth Rinse BID  . nystatin   Topical TID  . nystatin cream   Topical BID  . predniSONE  40 mg Oral Q breakfast  . Warfarin - Pharmacist Dosing Inpatient   Does not apply q1800   Continuous Infusions: . ceFEPime (MAXIPIME) IV Stopped (10/06/16 1301)     LOS: 9 days    Time spent: 30 min    Janece Canterbury, MD [facility] Pager [phone]  If 7PM-7AM, please contact night-coverage [email or web address] Password [facility] 10/06/2016, 5:45 PM

## 2016-10-07 DIAGNOSIS — R0600 Dyspnea, unspecified: Secondary | ICD-10-CM

## 2016-10-07 DIAGNOSIS — J9602 Acute respiratory failure with hypercapnia: Secondary | ICD-10-CM

## 2016-10-07 DIAGNOSIS — R918 Other nonspecific abnormal finding of lung field: Secondary | ICD-10-CM

## 2016-10-07 LAB — PROTIME-INR
INR: 1.84
PROTHROMBIN TIME: 21.5 s — AB (ref 11.4–15.2)

## 2016-10-07 MED ORDER — HYDROCOD POLST-CPM POLST ER 10-8 MG/5ML PO SUER
5.0000 mL | Freq: Two times a day (BID) | ORAL | Status: DC | PRN
  Administered 2016-10-13: 5 mL via ORAL
  Filled 2016-10-07 (×2): qty 5

## 2016-10-07 MED ORDER — PREDNISONE 20 MG PO TABS
20.0000 mg | ORAL_TABLET | Freq: Every day | ORAL | Status: DC
  Administered 2016-10-08 – 2016-10-16 (×9): 20 mg via ORAL
  Filled 2016-10-07 (×9): qty 1

## 2016-10-07 MED ORDER — WARFARIN SODIUM 5 MG PO TABS: 5.0000 mg | ORAL_TABLET | Freq: Once | ORAL | Status: DC

## 2016-10-07 MED ORDER — ENOXAPARIN SODIUM 60 MG/0.6ML ~~LOC~~ SOLN
1.0000 mg/kg | Freq: Two times a day (BID) | SUBCUTANEOUS | Status: DC
  Administered 2016-10-07 – 2016-10-10 (×6): 55 mg via SUBCUTANEOUS
  Filled 2016-10-07 (×6): qty 0.6

## 2016-10-07 MED ORDER — FUROSEMIDE 40 MG PO TABS
40.0000 mg | ORAL_TABLET | Freq: Every day | ORAL | Status: DC
  Administered 2016-10-07 – 2016-10-10 (×4): 40 mg via ORAL
  Filled 2016-10-07 (×4): qty 1

## 2016-10-07 NOTE — Progress Notes (Signed)
[facility] for Coumadin - hold coumadin in case of procedure needed now and use Lovenox perioperatively Indication: unspecified tachycardia  No Known Allergies  Patient Measurements: Height: 5\' 1"  (154.9 cm) Weight: 124 lb 9 oz (56.5 kg) IBW/kg (Calculated) : 47.8  Vital Signs: Temp: 98.7 F (37.1 C) (08/25 1507) Temp Source: Oral (08/25 1507) BP: 100/64 (08/25 1507) Pulse Rate: 81 (08/25 1507)  Labs:  Recent Labs  10/05/16 0425 10/06/16 0407 10/07/16 0359  HGB 11.3* 12.2  --   HCT 35.7* 37.8  --   PLT 311 353  --   LABPROT 15.4* 16.7* 21.5*  INR 1.21 1.35 1.84  CREATININE 0.48 0.45  --     Estimated Creatinine Clearance: 44.4 mL/min (by C-G formula based on SCr of 0.45 mg/dL).   Medical History: Past Medical History:  Diagnosis Date  . Hyperlipidemia   . Pneumonia   . Tachyarrhythmia     Medications:  Scheduled:  . acidophilus  1 capsule Oral Daily  . aspirin  81 mg Oral Daily  . diltiazem  180 mg Oral Daily  . famotidine  20 mg Oral BID  . furosemide  40 mg Oral Daily  . guaiFENesin  1,200 mg Oral BID  . levalbuterol  1.25 mg Nebulization TID  . mouth rinse  15 mL Mouth Rinse BID  . nystatin   Topical TID  . nystatin cream   Topical BID  . [START ON 10/08/2016] predniSONE  20 mg Oral Q breakfast  . Warfarin - Pharmacist Dosing Inpatient   Does not apply q1800   Infusions:   PRN: chlorpheniramine-HYDROcodone, levalbuterol, metoprolol tartrate, ondansetron (ZOFRAN) IV    Assessment: 76 yo female admitted with pneumonia. Her daughter reports that she is taking warfarin 5mg  daily for an abnormal heart rhythm but has not taken it in the past week. She was unable to say what physician monitors the INR.  INR is subtherapeutic at 0.92. Pharmacy is consulted to resume dosing now.  10/07/2016:  INR subtherapeutic but responding  CBC: Hgb, Pltc WNL  No bleeding reported  Cardiac diet ordered  Drug-drug  interactions- no major DDI.  Broad-spectrum antibiotics can increase INR  Goal of Therapy:  INR 2-3 Monitor platelets by anticoagulation protocol: Yes  Plan:  1) Lovenox 1mg /kg SQ q12 for Afib 2) Monitor SCr and CBC  Adrian Saran, PharmD, BCPS Pager [phone] 10/07/2016 4:23 PM

## 2016-10-07 NOTE — Progress Notes (Signed)
Spent 30 minutes in room with patient using interpreter [phone].  Reiterated what MD had spoken with her earlier.  She was very concerned that no one knew what was wrong with her.  Assured her that we sent her sputum for cytology to help determine her diagnosis.  Explained need to ambulate patient to determine need for oxygen at home.  Expressed need to have help with medicines at home - will order CM consult, if not already ordered. Andre Lefort

## 2016-10-07 NOTE — Care Management Note (Signed)
Case Management Note  Patient Details  Name: Michelle Mccall MRN: [id number] Date of Birth: [birth year]  Subjective/Objective:   Spoke with Baxter Flattery, RN to relay progress re: [facility] and [place] for this [place] speaking Female to be discharged today. Jermaine with [name] is working on DME and awaiting Liter flow to be added to Oxygen Saturation note, which Baxter Flattery is working on, as we speak (4L required). Baxter Flattery also aware Short, MD will need to place MD order. CM also Faxed MATCH letter with confirmation to RN for this pt to cover any meds that she will need with instructions to take to any pharmacy listed within 7 days. Will include [facility] on AVS for follow up. Received update that pt will not D/C til Monday                  Action/Plan:CM will follow closely for disposition/discharge needs.    Expected Discharge Date:                  Expected Discharge Plan:  Home/Self Care  In-House Referral:     Discharge planning Services  CM Consult, Medication Assistance, Other - See comment  Post Acute Care Choice:  Durable Medical Equipment Choice offered to:  Adult Children (pt speaks [place] and CM is located Out of facility)  DME Arranged:  Oxygen DME Agency:  [place]. ([place])  HH Arranged:    [facility] Agency:     Status of Service:  In process, will continue to follow  If discussed at Long Length of Stay Meetings, dates discussed:    Additional Comments:  Delrae Sawyers, RN 10/07/2016, 12:58 PM

## 2016-10-07 NOTE — Progress Notes (Addendum)
SATURATION QUALIFICATIONS: (This note is used to comply with regulatory documentation for home oxygen)  Patient Saturations on Room Air at Rest = 90  Patient Saturations on Room Air while Ambulating = 80% Sat increased to 94% when placed back on 3L/min per [place].  Pt became short of breath and not able to ambulate with O2 at this time.  Will attempt to ambulate with O2 at later time.  Please briefly explain why patient needs home oxygen: Saturation dropped to 80% while up without O2. Michelle Mccall

## 2016-10-07 NOTE — Progress Notes (Signed)
PROGRESS NOTE  Michelle Mccall  WJX:[id number] DOB: 1940-02-21 DOA: 09/27/2016 PCP: No primary care provider on file.  Brief Narrative:   76 year old [place] female with history of dyslipidemia, unspecified tachyarrhythmia, multiple episodes of pneumonia in past 9 months, November and December 2017, April 2018. Patient transferred from [facility] with progressive shortness of breath at rest and on exertion. No fevers or chills.  She was treated with steroids, antibiotics, and diuretics with no improvement.  No obvious aspiration noted by SLP.  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  Sputum cytology is being sent.  If nondiagnostic, may need bronchoscopy.    Assessment & Plan:   Principal Problem:   Sepsis ([place]) Active Problems:   HCAP (healthcare-associated pneumonia)   Hypokalemia   Anemia   Hypoalbuminemia   Tachyarrhythmia   Hyperlipidemia   Acute respiratory failure with hypoxia and hypercapnia (HCC)   Abnormal EKG   Pulmonary infiltrates  Persistent acute respiratory failure with hypoxia with near white-out of the left lung.  Patient is still dyspneic and having copious frothy sputum despite 10-days of IV antibiotics, a course of steroids, and diuresis to the point of hypotension.  She was afebrile, without leukocytosis at admission and her procalcitonin was negative, suggesting this may not be typical bacterial pneumonia.     -  Awaiting sputum fungal culture, AFB, and cytology -  Appreciate Pulmonology assistance -  Add tussionex -  Appreciate SLP assistance:  Regular diet with thin liquids -  Patient declined Esophogram   Hypokalemia / Hyperkalemia  - Related to sepsis - Potassium WNL  Hypotension, likely due to combination of lasix and resuming diltiazem, resolved.  Still having episodic SVT with known history of tachyarrhythmia despite starting diltiazem -  Continue diltiazem -  Hold warfarin in case patient needs bronchoscopy early  this coming week -  D/c telemetry for comfort. Not making adjustments to medications at this time  Normocytic anemia - Hgb stable at 10.7  DVT prophylaxis:  lovenox Code Status:   Full code Family Communication:  Patient with ipad interpreter  Disposition Plan:  PT/OT recommending no follow up.  Awaiting cytology and may need bronchoscopy.  Holding warfarin in preparation for possible procedure  Consultants:   PCCM  Procedures:  none  Antimicrobials:  Anti-infectives    Start     Dose/Rate Route Frequency Ordered Stop   10/01/16 1200  ceFEPIme (MAXIPIME) 1 g in dextrose 5 % 50 mL IVPB  Status:  Discontinued     1 g 100 mL/hr over 30 Minutes Intravenous Every 12 hours 10/01/16 1028 10/06/16 1754   09/28/16 2100  vancomycin (VANCOCIN) IVPB 750 mg/150 ml premix  Status:  Discontinued     750 mg 150 mL/hr over 60 Minutes Intravenous Every 24 hours 09/27/16 2102 09/30/16 0647   09/28/16 2100  ceFEPIme (MAXIPIME) 1 g in dextrose 5 % 50 mL IVPB  Status:  Discontinued     1 g 100 mL/hr over 30 Minutes Intravenous Every 24 hours 09/27/16 2104 10/01/16 1028   09/27/16 2200  ceFEPIme (MAXIPIME) 1 g in dextrose 5 % 50 mL IVPB  Status:  Discontinued     1 g 100 mL/hr over 30 Minutes Intravenous Every 8 hours 09/27/16 2050 09/27/16 2104   09/27/16 2100  vancomycin (VANCOCIN) IVPB 1000 mg/200 mL premix     1,000 mg 200 mL/hr over 60 Minutes Intravenous  Once 09/27/16 2050 09/27/16 2245   09/27/16 2057  ceFEPIme (MAXIPIME) 1 g injection  Comments:  Woodward Ku   : cabinet override      09/27/16 2057 09/28/16 0859       Subjective:  Ongoing dyspnea.  Afraid to sleep due to copious secretions.  Coughing up cups and cups of frothy sputum.  Denies fevers, chills, chest pains.    Objective: Vitals:   10/07/16 0607 10/07/16 0814 10/07/16 1431 10/07/16 1507  BP:    100/64  Pulse:    81  Resp:  16  16  Temp:    98.7 F (37.1 C)  TempSrc:    Oral  SpO2:   94% 90%  Weight: 56.5  kg (124 lb 9 oz)     Height:        Intake/Output Summary (Last 24 hours) at 10/07/16 1539 Last data filed at 10/06/16 2051  Gross per 24 hour  Intake                0 ml  Output              150 ml  Net             -150 ml   Filed Weights   10/04/16 1037 10/06/16 0602 10/07/16 0607  Weight: 59.8 kg (131 lb 13.4 oz) 56.5 kg (124 lb 9 oz) 56.5 kg (124 lb 9 oz)    Examination:  General exam:  Adult female, sitting upright, no acute distress, but frequently coughs up copious clear phlegm  HEENT:  NCAT, MMM Respiratory system:  Stable diminished breath sounds on the left with course rales and + rhonchi.  Decreased wheeze today Cardiovascular system: Regular rate and rhythm, normal S1/S2. No murmurs, rubs, gallops or clicks.  Warm extremities Gastrointestinal system: Normal active bowel sounds, soft, nondistended, nontender. MSK:  Normal tone and bulk, no lower extremity edema Neuro:  Grossly intact       Data Reviewed: I have personally reviewed following labs and imaging studies  CBC:  Recent Labs Lab 10/01/16 0421 10/02/16 0329 10/03/16 0259 10/04/16 0427 10/05/16 0425 10/06/16 0407  WBC 11.3* 11.5* 11.5* 9.8 10.2 10.6*  NEUTROABS 9.9*  --   --   --   --   --   HGB 12.3 10.6* 10.7* 12.1 11.3* 12.2  HCT 38.2 33.2* 33.4* 38.6 35.7* 37.8  MCV 86.0 86.7 87.0 86.0 84.2 85.7  PLT 306 255 273 349 311 671   Basic Metabolic Panel:  Recent Labs Lab 10/02/16 0329 10/03/16 0259 10/04/16 0427 10/05/16 0425 10/06/16 0407  NA 135 138 139 136 138  K 5.2* 4.8 3.8 3.8 4.5  CL 103 103 99* 95* 100*  CO2 25 31 36* 35* 33*  GLUCOSE 206* 133* 89 110* 88  BUN 15 17 15 18 16   CREATININE 0.50 0.36* 0.45 0.48 0.45  CALCIUM 9.0 9.1 8.7* 8.6* 8.8*   GFR: Estimated Creatinine Clearance: 44.4 mL/min (by C-G formula based on SCr of 0.45 mg/dL). Liver Function Tests: No results for input(s): AST, ALT, ALKPHOS, BILITOT, PROT, ALBUMIN in the last 168 hours. No results for input(s):  LIPASE, AMYLASE in the last 168 hours. No results for input(s): AMMONIA in the last 168 hours. Coagulation Profile:  Recent Labs Lab 10/03/16 0259 10/04/16 0427 10/05/16 0425 10/06/16 0407 10/07/16 0359  INR 2.23 1.39 1.21 1.35 1.84   Cardiac Enzymes: No results for input(s): CKTOTAL, CKMB, CKMBINDEX, TROPONINI in the last 168 hours. BNP (last 3 results) No results for input(s): PROBNP in the last 8760 hours. HbA1C: No results for input(s):  HGBA1C in the last 72 hours. CBG: No results for input(s): GLUCAP in the last 168 hours. Lipid Profile: No results for input(s): CHOL, HDL, LDLCALC, TRIG, CHOLHDL, LDLDIRECT in the last 72 hours. Thyroid Function Tests: No results for input(s): TSH, T4TOTAL, FREET4, T3FREE, THYROIDAB in the last 72 hours. Anemia Panel: No results for input(s): VITAMINB12, FOLATE, FERRITIN, TIBC, IRON, RETICCTPCT in the last 72 hours. Urine analysis: No results found for: COLORURINE, APPEARANCEUR, LABSPEC, PHURINE, GLUCOSEU, HGBUR, BILIRUBINUR, KETONESUR, PROTEINUR, UROBILINOGEN, NITRITE, LEUKOCYTESUR Sepsis Labs: @LABRCNTIP (procalcitonin:4,lacticidven:4)  ) Recent Results (from the past 240 hour(s))  Blood culture (routine x 2)     Status: None   Collection Time: 09/27/16  6:50 PM  Result Value Ref Range Status   Specimen Description BLOOD RIGHT ANTECUBITAL  Final   Special Requests Blood Culture adequate volume IN PEDIATRIC BOTTLE  Final   Culture   Final    NO GROWTH 5 DAYS Performed at [facility], [address], [place], [place] [address]    Report Status 10/02/2016 FINAL  Final  Blood culture (routine x 2)     Status: None   Collection Time: 09/27/16  7:05 PM  Result Value Ref Range Status   Specimen Description BLOOD BLOOD LEFT ARM  Final   Special Requests   Final    Blood Culture adequate volume BOTTLES DRAWN AEROBIC AND ANAEROBIC   Culture   Final    NO GROWTH 5 DAYS Performed at [facility], [place] [address]  [place], [place] [address]    Report Status 10/02/2016 FINAL  Final  MRSA PCR Screening     Status: None   Collection Time: 09/28/16 12:56 AM  Result Value Ref Range Status   MRSA by PCR NEGATIVE NEGATIVE Final    Comment:        The GeneXpert MRSA Assay (FDA approved for NASAL specimens only), is one component of a comprehensive MRSA colonization surveillance program. It is not intended to diagnose MRSA infection nor to guide or monitor treatment for MRSA infections.   Culture, sputum-assessment     Status: None   Collection Time: 09/28/16  4:37 AM  Result Value Ref Range Status   Specimen Description SPUTUM  Final   Special Requests Normal  Final   Sputum evaluation THIS SPECIMEN IS ACCEPTABLE FOR SPUTUM CULTURE  Final   Report Status 09/28/2016 FINAL  Final  Culture, respiratory (NON-Expectorated)     Status: None   Collection Time: 09/28/16  4:37 AM  Result Value Ref Range Status   Specimen Description SPUTUM  Final   Special Requests Normal Reflexed from [id number]  Final   Gram Stain NO WBC SEEN NO ORGANISMS SEEN   Final   Culture   Final    Consistent with normal respiratory flora. Performed at [facility], [place] [address] [place], [place] [address]    Report Status 09/30/2016 FINAL  Final      Radiology Studies: Dg Chest Port 1 View  Result Date: 10/06/2016 CLINICAL DATA:  Dyspnea. EXAM: PORTABLE CHEST 1 VIEW COMPARISON:  Single-view of the chest 10/05/2016 and 10/03/2016. PA and lateral chest 09/27/2016. CT chest 09/27/2016. FINDINGS: Extensive airspace disease throughout almost the entire left chest and a left pleural effusion persist without marked change since the 09/27/2016 study. There is a trace right pleural effusion and mild right basilar airspace disease. Calcified granuloma on the right is noted. IMPRESSION: No change in extensive airspace disease left chest and left effusion compatible with pneumonia.  Electronically Signed   By: Inge Rise M.D.   On:  10/06/2016 10:18     Scheduled Meds: . acidophilus  1 capsule Oral Daily  . aspirin  81 mg Oral Daily  . diltiazem  180 mg Oral Daily  . famotidine  20 mg Oral BID  . furosemide  40 mg Oral Daily  . guaiFENesin  1,200 mg Oral BID  . levalbuterol  1.25 mg Nebulization TID  . mouth rinse  15 mL Mouth Rinse BID  . nystatin   Topical TID  . nystatin cream   Topical BID  . [START ON 10/08/2016] predniSONE  20 mg Oral Q breakfast  . warfarin  5 mg Oral ONCE-1800  . Warfarin - Pharmacist Dosing Inpatient   Does not apply q1800   Continuous Infusions:    LOS: 10 days    Time spent: 30 min    Janece Canterbury, MD [facility] Pager [phone]  If 7PM-7AM, please contact night-coverage [email or web address] Password [facility] 10/07/2016, 3:39 PM

## 2016-10-07 NOTE — Progress Notes (Signed)
PULMONARY / CRITICAL CARE MEDICINE   Name: Michelle Mccall MRN: [id number] DOB: September 03, 1940    ADMISSION DATE:  09/27/2016 CONSULTATION DATE: 09/28/2016  REFERRING MD:  Neil Crouch MD  CHIEF COMPLAINT:  Sepsis, pneumonia  HISTORY OF PRESENT ILLNESS:   72 yowf [place]/ never smoker  with recurrent episodes of pneumonia vs persistent AS dz LLL dating back to at least 05/29/16 raising possibility of BAC   SUBJECTIVE:   almost constant coughing/ sounds rattly, mucoid only  No sob on 3lpm / not using IS correctly (blowing into it)    .  VITAL SIGNS: BP 99/62 (BP Location: Left Arm)   Pulse 69   Temp 97.9 F (36.6 C) (Oral)   Resp 16   Ht 5\' 1"  (1.549 m)   Wt 124 lb 9 oz (56.5 kg)   SpO2 94%   BMI 23.54 kg/m   INTAKE / OUTPUT: I/O last 3 completed shifts: In: -  Out: 1000 [Urine:1000]  PHYSICAL EXAMINATION:  Wt Readings from Last 3 Encounters:  10/07/16 124 lb 9 oz (56.5 kg)    Vital signs reviewed   General - pleasant/ not toxic Eyes - pupils reactive ENT - no sinus tenderness, no oral exudate, no LAN Cardiac - regular, no murmur Chest - R lung clear, L lung with BV changes, coarse insp crackles and exp rhonchi  Abd - soft, non tender Ext - no edema Skin - no rashes Neuro - normal strength Psych - normal mood  LABS:  BMET  Recent Labs Lab 10/04/16 0427 10/05/16 0425 10/06/16 0407  NA 139 136 138  K 3.8 3.8 4.5  CL 99* 95* 100*  CO2 36* 35* 33*  BUN 15 18 16   CREATININE 0.45 0.48 0.45  GLUCOSE 89 110* 88    Electrolytes  Recent Labs Lab 10/04/16 0427 10/05/16 0425 10/06/16 0407  CALCIUM 8.7* 8.6* 8.8*    CBC  Recent Labs Lab 10/04/16 0427 10/05/16 0425 10/06/16 0407  WBC 9.8 10.2 10.6*  HGB 12.1 11.3* 12.2  HCT 38.6 35.7* 37.8  PLT 349 311 353    Coag's  Recent Labs Lab 10/05/16 0425 10/06/16 0407 10/07/16 0359  INR 1.21 1.35 1.84    Sepsis Markers  Recent Labs Lab 10/01/16 0421 10/02/16 0329  PROCALCITON <0.10  <0.10    ABG No results for input(s): PHART, PCO2ART, PO2ART in the last 168 hours.  Liver Enzymes No results for input(s): AST, ALT, ALKPHOS, BILITOT, ALBUMIN in the last 168 hours.  Cardiac Enzymes No results for input(s): TROPONINI, PROBNP in the last 168 hours.  Glucose No results for input(s): GLUCAP in the last 168 hours.  Imaging Dg Chest Port 1 View  Result Date: 10/06/2016 CLINICAL DATA:  Dyspnea. EXAM: PORTABLE CHEST 1 VIEW COMPARISON:  Single-view of the chest 10/05/2016 and 10/03/2016. PA and lateral chest 09/27/2016. CT chest 09/27/2016. FINDINGS: Extensive airspace disease throughout almost the entire left chest and a left pleural effusion persist without marked change since the 09/27/2016 study. There is a trace right pleural effusion and mild right basilar airspace disease. Calcified granuloma on the right is noted. IMPRESSION: No change in extensive airspace disease left chest and left effusion compatible with pneumonia. Electronically Signed   By: Inge Rise M.D.   On: 10/06/2016 10:18    STUDIES:  CT chest 8/15 >> extensive consolidation on Lt, 12 mm RUL nodule Echo 8/16 >> EF 60 to 65%, grade 1 DD, severe RV dilation, mod TR, PAS 58 mmHg Serology 8/19 >> ANA, RF,  anti CCP, ANCA all negative Dg Es   10/04/16 > no asp  Sputum cytology 8/25 >>>   CULTURES/micro: Strep urinary antigen 5/16 > neg Strep legionella antigen 8/16 > neg  PCT    8/20  >  Neg  Blood 8/15 >> final neg x2 Sputum 8/16 >> no wbc/ no org seen >  oral flora Sputum for afb "sent" s date submitted    ANTIBIOTICS: Vanco 8/16 > 8/18 Cefepime 8/16 > 8/24   SIGNIFICANT EVENTS: 8/16 admit with multilobar PNA  ASSESSMENT / PLAN:  1) Localized pulmonary infiltrates with clinical course inconsistent with "pna"    I don't believe this is "recurrent pna"  Nor  recurrent  aspiration (just to  the the LLL would be unlikely anyway with nothing to support this on DgEs 8/22)  but rather one of  the mimics of pna and based on the time frame dating back 4 months s ever clearing the infiltrates radiographically ( hard to be sure about this as all we have is reports in care everywhere starting in 05/29/16 of  "Extensive infiltrates within the left mid and lower lung fields" ) I would favor BAC here until proven otherwise and doubt infection at all so ok to leave off abx for now  Rec:  Sputum cytology (she's producing plenty) and consider fob next week and repeat the afb/ fungal studies if not completed           Correct use of IS/ add flutter    2)  Acute hypoxemia and mild hypercarbic resp failure in never smoker  Rec :: 02 as you are, no role for anticholinergic rx > d/c atrovent   Will see again 8/27 - call sooner if needed    Christinia Gully, MD Pulmonary and [place] Cell [phone] After 5:30 PM or weekends, use Beeper [phone]

## 2016-10-07 NOTE — Progress Notes (Signed)
[place] for Coumadin Indication: unspecified tachycardia  No Known Allergies  Patient Measurements: Height: 5\' 1"  (154.9 cm) Weight: 124 lb 9 oz (56.5 kg) IBW/kg (Calculated) : 47.8  Vital Signs: Temp: 97.9 F (36.6 C) (08/25 0501) Temp Source: Oral (08/25 0501) BP: 99/62 (08/25 0501) Pulse Rate: 69 (08/25 0501)  Labs:  Recent Labs  10/05/16 0425 10/06/16 0407 10/07/16 0359  HGB 11.3* 12.2  --   HCT 35.7* 37.8  --   PLT 311 353  --   LABPROT 15.4* 16.7* 21.5*  INR 1.21 1.35 1.84  CREATININE 0.48 0.45  --     Estimated Creatinine Clearance: 44.4 mL/min (by C-G formula based on SCr of 0.45 mg/dL).   Medical History: Past Medical History:  Diagnosis Date  . Hyperlipidemia   . Pneumonia   . Tachyarrhythmia     Medications:  Scheduled:  . acidophilus  1 capsule Oral Daily  . aspirin  81 mg Oral Daily  . diltiazem  180 mg Oral Daily  . famotidine  20 mg Oral BID  . furosemide  40 mg Oral Daily  . guaiFENesin  1,200 mg Oral BID  . levalbuterol  1.25 mg Nebulization TID  . mouth rinse  15 mL Mouth Rinse BID  . nystatin   Topical TID  . nystatin cream   Topical BID  . [START ON 10/08/2016] predniSONE  20 mg Oral Q breakfast  . Warfarin - Pharmacist Dosing Inpatient   Does not apply q1800   Infusions:   PRN: levalbuterol, metoprolol tartrate, ondansetron (ZOFRAN) IV    Assessment: 76 yo female admitted with pneumonia. Her daughter reports that she is taking warfarin 5mg  daily for an abnormal heart rhythm but has not taken it in the past week. She was unable to say what physician monitors the INR.  INR is subtherapeutic at 0.92. Pharmacy is consulted to resume dosing now.  10/07/2016:  INR subtherapeutic but responding  CBC: Hgb, Pltc WNL  No bleeding reported  Cardiac diet ordered  Drug-drug interactions- no major DDI.  Broad-spectrum antibiotics can increase INR  Goal of Therapy:  INR 2-3 Monitor platelets  by anticoagulation protocol: Yes  Plan:   Warfarin 5mg  PO x 1 today  Daily PT/INR  Consider stopping aspirin with concurrent warfarin therapy   Adrian Saran, PharmD, BCPS Pager [phone] 10/07/2016 10:34 AM

## 2016-10-08 DIAGNOSIS — R0602 Shortness of breath: Secondary | ICD-10-CM

## 2016-10-08 LAB — PROTIME-INR
INR: 1.92
Prothrombin Time: 22.3 seconds — ABNORMAL HIGH (ref 11.4–15.2)

## 2016-10-08 LAB — SEDIMENTATION RATE: Sed Rate: 20 mm/hr (ref 0–22)

## 2016-10-08 NOTE — Progress Notes (Signed)
PROGRESS NOTE  Michelle Mccall  PPI:[id number] DOB: 21-Feb-1940 DOA: 09/27/2016 PCP: No primary care provider on file.  Brief Narrative:   76 year old [place] female with history of dyslipidemia, unspecified tachyarrhythmia, multiple episodes of pneumonia in past 9 months, November and December 2017, April 2018. Patient transferred from [facility] with progressive shortness of breath at rest and on exertion. No fevers or chills.  She was treated with steroids, antibiotics, and diuretics with no improvement.  No obvious aspiration noted by SLP.  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  Sputum cytology is sent, results pending.  If nondiagnostic, may need bronchoscopy.    Assessment & Plan:   Principal Problem:   Sepsis ([place]) Active Problems:   HCAP (healthcare-associated pneumonia)   Hypokalemia   Anemia   Hypoalbuminemia   Tachyarrhythmia   Hyperlipidemia   Acute respiratory failure with hypoxia and hypercapnia (HCC)   Abnormal EKG   Pulmonary infiltrates  Persistent acute respiratory failure with hypoxia with near white-out of the left lung.  Patient is still dyspneic at rest and having copious frothy sputum despite 10-days of IV antibiotics, a course of steroids, and diuresis to the point of hypotension.  History suggests malignancy. -  Awaiting sputum fungal culture, AFB, and cytology -  Appreciate Pulmonology assistance -  Appreciate SLP assistance:  Regular diet with thin liquids -  Patient declined Esophogram   Hypokalemia / Hyperkalemia  - Related to sepsis - Potassium WNL  Hypotension, likely due to combination of lasix and resuming diltiazem, resolved.  Episodic SVT with known history of tachyarrhythmia despite starting diltiazem, asymptomatic -  Continue diltiazem -  Hold warfarin in case patient needs bronchoscopy early this coming week  Normocytic anemia, hemoglobin gradually increasing  DVT prophylaxis:  lovenox Code Status:    Full code Family Communication:  Patient with ipad interpreter  Disposition Plan:  PT/OT recommending no follow up.  Awaiting cytology and may need bronchoscopy.  Holding warfarin in preparation for possible procedure  Consultants:   PCCM  Procedures:  none  Antimicrobials:  Anti-infectives    Start     Dose/Rate Route Frequency Ordered Stop   10/01/16 1200  ceFEPIme (MAXIPIME) 1 g in dextrose 5 % 50 mL IVPB  Status:  Discontinued     1 g 100 mL/hr over 30 Minutes Intravenous Every 12 hours 10/01/16 1028 10/06/16 1754   09/28/16 2100  vancomycin (VANCOCIN) IVPB 750 mg/150 ml premix  Status:  Discontinued     750 mg 150 mL/hr over 60 Minutes Intravenous Every 24 hours 09/27/16 2102 09/30/16 0647   09/28/16 2100  ceFEPIme (MAXIPIME) 1 g in dextrose 5 % 50 mL IVPB  Status:  Discontinued     1 g 100 mL/hr over 30 Minutes Intravenous Every 24 hours 09/27/16 2104 10/01/16 1028   09/27/16 2200  ceFEPIme (MAXIPIME) 1 g in dextrose 5 % 50 mL IVPB  Status:  Discontinued     1 g 100 mL/hr over 30 Minutes Intravenous Every 8 hours 09/27/16 2050 09/27/16 2104   09/27/16 2100  vancomycin (VANCOCIN) IVPB 1000 mg/200 mL premix     1,000 mg 200 mL/hr over 60 Minutes Intravenous  Once 09/27/16 2050 09/27/16 2245   09/27/16 2057  ceFEPIme (MAXIPIME) 1 g injection    Comments:  Woodward Ku   : cabinet override      09/27/16 2057 09/28/16 0859       Subjective:  Woke up overnight choking on her secretions. States it was very scary. She  continues to cough up cups and cups of clear phlegm without blood, yellow or green appearance. She gets winded when she talks. She is scared to go to sleep at night because of her breathing. Denies fevers or chills. Having poor appetite and not eating much.  Objective: Vitals:   10/08/16 0832 10/08/16 1101 10/08/16 1336 10/08/16 1442  BP:  106/75  106/64  Pulse:  77  83  Resp:    18  Temp:  98.3 F (36.8 C)  98.4 F (36.9 C)  TempSrc:  Oral  Tympanic    SpO2: 94% 92% 94% 95%  Weight:      Height:        Intake/Output Summary (Last 24 hours) at 10/08/16 1527 Last data filed at 10/08/16 0935  Gross per 24 hour  Intake              120 ml  Output                0 ml  Net              120 ml   Filed Weights   10/06/16 0602 10/07/16 0607 10/08/16 0609  Weight: 56.5 kg (124 lb 9 oz) 56.5 kg (124 lb 9 oz) 54.9 kg (121 lb 0.2 oz)    Examination:   General exam:  Adult female, sitting up in bed, coughing frequently and coughing out copious clear phlegm HEENT:  NCAT, MMM PULM:  Diminished bilateral breath sounds but more on the left throughout and only at the right base. Left side has coarse rales and rhonchi. Wheezes throughout. CV:  RRR, no murmurs GI:  NABS, soft, nontender, nondistended MSK:  No lower extremity edema, normal tone and bulk, Aritzel Krusemark stature Neuro:  Grossly moves all extremities     Data Reviewed: I have personally reviewed following labs and imaging studies  CBC:  Recent Labs Lab 10/02/16 0329 10/03/16 0259 10/04/16 0427 10/05/16 0425 10/06/16 0407  WBC 11.5* 11.5* 9.8 10.2 10.6*  HGB 10.6* 10.7* 12.1 11.3* 12.2  HCT 33.2* 33.4* 38.6 35.7* 37.8  MCV 86.7 87.0 86.0 84.2 85.7  PLT 255 273 349 311 941   Basic Metabolic Panel:  Recent Labs Lab 10/02/16 0329 10/03/16 0259 10/04/16 0427 10/05/16 0425 10/06/16 0407  NA 135 138 139 136 138  K 5.2* 4.8 3.8 3.8 4.5  CL 103 103 99* 95* 100*  CO2 25 31 36* 35* 33*  GLUCOSE 206* 133* 89 110* 88  BUN 15 17 15 18 16   CREATININE 0.50 0.36* 0.45 0.48 0.45  CALCIUM 9.0 9.1 8.7* 8.6* 8.8*   GFR: Estimated Creatinine Clearance: 44.4 mL/min (by C-G formula based on SCr of 0.45 mg/dL). Liver Function Tests: No results for input(s): AST, ALT, ALKPHOS, BILITOT, PROT, ALBUMIN in the last 168 hours. No results for input(s): LIPASE, AMYLASE in the last 168 hours. No results for input(s): AMMONIA in the last 168 hours. Coagulation Profile:  Recent Labs Lab  10/04/16 0427 10/05/16 0425 10/06/16 0407 10/07/16 0359 10/08/16 0424  INR 1.39 1.21 1.35 1.84 1.92   Cardiac Enzymes: No results for input(s): CKTOTAL, CKMB, CKMBINDEX, TROPONINI in the last 168 hours. BNP (last 3 results) No results for input(s): PROBNP in the last 8760 hours. HbA1C: No results for input(s): HGBA1C in the last 72 hours. CBG: No results for input(s): GLUCAP in the last 168 hours. Lipid Profile: No results for input(s): CHOL, HDL, LDLCALC, TRIG, CHOLHDL, LDLDIRECT in the last 72 hours. Thyroid Function Tests: No  results for input(s): TSH, T4TOTAL, FREET4, T3FREE, THYROIDAB in the last 72 hours. Anemia Panel: No results for input(s): VITAMINB12, FOLATE, FERRITIN, TIBC, IRON, RETICCTPCT in the last 72 hours. Urine analysis: No results found for: COLORURINE, APPEARANCEUR, LABSPEC, PHURINE, GLUCOSEU, HGBUR, BILIRUBINUR, KETONESUR, PROTEINUR, UROBILINOGEN, NITRITE, LEUKOCYTESUR Sepsis Labs: @LABRCNTIP (procalcitonin:4,lacticidven:4)  ) No results found for this or any previous visit (from the past 240 hour(s)).    Radiology Studies: No results found.   Scheduled Meds: . acidophilus  1 capsule Oral Daily  . aspirin  81 mg Oral Daily  . diltiazem  180 mg Oral Daily  . enoxaparin (LOVENOX) injection  1 mg/kg Subcutaneous Q12H  . famotidine  20 mg Oral BID  . furosemide  40 mg Oral Daily  . guaiFENesin  1,200 mg Oral BID  . levalbuterol  1.25 mg Nebulization TID  . mouth rinse  15 mL Mouth Rinse BID  . nystatin   Topical TID  . nystatin cream   Topical BID  . predniSONE  20 mg Oral Q breakfast   Continuous Infusions:    LOS: 11 days    Time spent: 30 min    Michelle Canterbury, MD [facility] Pager [phone]  If 7PM-7AM, please contact night-coverage [email or web address] Password [facility] 10/08/2016, 3:27 PM

## 2016-10-08 NOTE — Progress Notes (Signed)
Spoke w/ pt. Using the interpreter encouraged pt to use flutter valve to help with her breathing. Pt stated that she becomes sob at rest and has to change positions to get her breath. Pt O2 stat is 95% w/ 3 liters Pt also states she is not in pain. Continue with plan of care.

## 2016-10-09 ENCOUNTER — Inpatient Hospital Stay (HOSPITAL_COMMUNITY): Payer: Medicaid Other

## 2016-10-09 LAB — CBC
HCT: 37.6 % (ref 36.0–46.0)
Hemoglobin: 12 g/dL (ref 12.0–15.0)
MCH: 26.7 pg (ref 26.0–34.0)
MCHC: 31.9 g/dL (ref 30.0–36.0)
MCV: 83.6 fL (ref 78.0–100.0)
Platelets: 403 10*3/uL — ABNORMAL HIGH (ref 150–400)
RBC: 4.5 MIL/uL (ref 3.87–5.11)
RDW: 14.7 % (ref 11.5–15.5)
WBC: 10.5 10*3/uL (ref 4.0–10.5)

## 2016-10-09 LAB — PROTIME-INR
INR: 1.56
PROTHROMBIN TIME: 18.8 s — AB (ref 11.4–15.2)

## 2016-10-09 LAB — BASIC METABOLIC PANEL
ANION GAP: 7 (ref 5–15)
BUN: 23 mg/dL — AB (ref 6–20)
CALCIUM: 9.3 mg/dL (ref 8.9–10.3)
CO2: 33 mmol/L — ABNORMAL HIGH (ref 22–32)
Chloride: 98 mmol/L — ABNORMAL LOW (ref 101–111)
Creatinine, Ser: 0.49 mg/dL (ref 0.44–1.00)
GFR calc Af Amer: >60 mL/min (ref >60)
GLUCOSE: 104 mg/dL — AB (ref 65–99)
Potassium: 3.8 mmol/L (ref 3.5–5.1)
Sodium: 138 mmol/L (ref 135–145)

## 2016-10-09 LAB — EXPECTORATED SPUTUM ASSESSMENT W REFEX TO RESP CULTURE: SPECIAL REQUESTS: NORMAL

## 2016-10-09 LAB — EXPECTORATED SPUTUM ASSESSMENT W GRAM STAIN, RFLX TO RESP C

## 2016-10-09 NOTE — Progress Notes (Signed)
PHYSICAL THERAPY   SATURATION QUALIFICATIONS: (This note is used to comply with regulatory documentation for home oxygen)  Patient Saturations on Room Air at Rest = 90%  Patient Saturations on Room Air while Ambulating > 250 feet  = 81%  Patient Saturations on 2 Liters of oxygen while Ambulating = 89%  Please briefly explain why patient needs home oxygen:  Pt requires supplemental oxygen to achieve therapeutic levels with activity.  Also required cueing for deep breathing and encouraged coughing.  Michelle Mccall  PTA [facility]  Acute  Rehab Pager      [phone]

## 2016-10-09 NOTE — Progress Notes (Signed)
Physical Therapy Treatment Patient Details Name: Michelle Mccall MRN: [id number] DOB: 26-Jul-1940 Today's Date: 10/09/2016    History of Present Illness Pt admitted with dx sepsis 2* recurrent PNA    PT Comments    Assisted with amb in hallway on RA  Pt does NOT require any AD but did require supplemental    SATURATION QUALIFICATIONS: (This note is used to comply with regulatory documentation for home oxygen  Patient Saturations on Room Air at Rest = 90%  Patient Saturations on Room Air while Ambulating > 250 feet  = 81%  Patient Saturations on 2 Liters of oxygen while Ambulating = 89%  Please briefly explain why patient needs home oxygen:  Pt requires supplemental oxygen to achieve therapeutic levels with activity.  Also required cueing for deep breathing and encouraged coughing.     Follow Up Recommendations  No PT follow up     Equipment Recommendations  None recommended by PT    Recommendations for Other Services       Precautions / Restrictions Precautions Precautions: Fall Precaution Comments: monitor sats/likes to wear shoes/speaks [place] Restrictions Weight Bearing Restrictions: No    Mobility  Bed Mobility Overal bed mobility: Modified Independent                Transfers Overall transfer level: Needs assistance Equipment used: None Transfers: Sit to/from [facility] Sit to Stand: Supervision Stand pivot transfers: Supervision       General transfer comment: Supervision for environmental safety and O2 tubing.  Good use of hands to steady self  Ambulation/Gait Ambulation/Gait assistance: Supervision;Min guard Ambulation Distance (Feet): 185 Feet Assistive device: None Gait Pattern/deviations: Step-through pattern;Decreased stride length;Trunk flexed Gait velocity: WFL   General Gait Details: amb without AD.  Good alternating gait and functional spped.  Amb on RA decreased to 81%.  Reapplied 2 lts to achieve 89% and  visually instructed on purse lip breathing/encouraged coughing.     Stairs            Wheelchair Mobility    Modified Rankin (Stroke Patients Only)       Balance                                            Cognition Arousal/Alertness: Awake/alert Behavior During Therapy: WFL for tasks assessed/performed Overall Cognitive Status: Within Functional Limits for tasks assessed                                 General Comments: speaks some English      Exercises      General Comments        Pertinent Vitals/Pain Pain Assessment: No/denies pain    Home Living                      Prior Function            PT Goals (current goals can now be found in the care plan section) Progress towards PT goals: Progressing toward goals    Frequency    Min 3X/week      PT Plan Current plan remains appropriate    Co-evaluation              AM-PAC PT "6 Clicks" Daily Activity  Outcome Measure  Difficulty turning over in bed (including  adjusting bedclothes, sheets and blankets)?: None Difficulty moving from lying on back to sitting on the side of the bed? : A Little Difficulty sitting down on and standing up from a chair with arms (e.g., wheelchair, bedside commode, etc,.)?: A Little Help needed moving to and from a bed to chair (including a wheelchair)?: A Little Help needed walking in hospital room?: A Little Help needed climbing 3-5 steps with a railing? : A Little 6 Click Score: 19    End of Session Equipment Utilized During Treatment: Gait belt;Oxygen Activity Tolerance: Patient tolerated treatment well Patient left: in chair;with call bell/phone within reach;with chair alarm set Nurse Communication: Mobility status PT Visit Diagnosis: Unsteadiness on feet (R26.81);Muscle weakness (generalized) (M62.81)     Time: 6979-4801 PT Time Calculation (min) (ACUTE ONLY): 26 min  Charges:  $Gait Training: 8-22  mins $Therapeutic Activity: 8-22 mins                    G Codes:       Rica Koyanagi  PTA [facility]  Acute  Rehab Pager      [phone]

## 2016-10-09 NOTE — Progress Notes (Signed)
PULMONARY / CRITICAL CARE MEDICINE   Name: Michelle Mccall MRN: [id number] DOB: 09/24/1940    ADMISSION DATE:  09/27/2016 CONSULTATION DATE: 09/28/2016  REFERRING MD:  Neil Crouch MD  CHIEF COMPLAINT:  Sepsis, pneumonia  HISTORY OF PRESENT ILLNESS:   26 yowf [place]/ never smoker  with recurrent episodes of pneumonia vs persistent AS dz LLL dating back to at least 05/29/16 raising possibility of BAC.  She is an immigrant from [place]. 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 She had a spine surgery in the 1960s and had some kind of lung issue after that. She is a never smoker. 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 She denies any exposure to TB in the past.  SUBJECTIVE:  Continues to cough mucus. Dyspnea is unchanged.  VITAL SIGNS: BP 106/70 (BP Location: Right Arm)   Pulse 76   Temp 97.9 F (36.6 C) (Oral)   Resp 18   Ht 5\' 1"  (1.549 m)   Wt 52.8 kg (116 lb 6.4 oz)   SpO2 94%   BMI 21.99 kg/m   INTAKE / OUTPUT: I/O last 3 completed shifts: In: 120 [P.O.:120] Out: -   PHYSICAL EXAMINATION:  Wt Readings from Last 3 Encounters:  10/09/16 52.8 kg (116 lb 6.4 oz)    Blood pressure 106/70, pulse 76, temperature 97.9 F (36.6 C), temperature source Oral, resp. rate 18, height 5\' 1"  (1.549 m), weight 52.8 kg (116 lb 6.4 oz), SpO2 94 %. Gen:      No acute distress HEENT:  EOMI, sclera anicteric Neck:     No masses; no thyromegaly Lungs:    B/L crackles, rhonchi; normal respiratory effort CV:         Regular rate and rhythm; no murmurs Abd:      + bowel sounds; soft, non-tender; no palpable masses, no distension Ext:    No edema; adequate peripheral perfusion Skin:      Warm and dry; no rash Neuro: alert and oriented x 3 Psych: normal mood and affect  LABS:  BMET  Recent Labs Lab 10/05/16 0425 10/06/16 0407 10/09/16 0423  NA  136 138 138  K 3.8 4.5 3.8  CL 95* 100* 98*  CO2 35* 33* 33*  BUN 18 16 23*  CREATININE 0.48 0.45 0.49  GLUCOSE 110* 88 104*    Electrolytes  Recent Labs Lab 10/05/16 0425 10/06/16 0407 10/09/16 0423  CALCIUM 8.6* 8.8* 9.3    CBC  Recent Labs Lab 10/05/16 0425 10/06/16 0407 10/09/16 0423  WBC 10.2 10.6* 10.5  HGB 11.3* 12.2 12.0  HCT 35.7* 37.8 37.6  PLT 311 353 403*    Coag's  Recent Labs Lab 10/07/16 0359 10/08/16 0424 10/09/16 0423  INR 1.84 1.92 1.56    Sepsis Markers No results for input(s): LATICACIDVEN, PROCALCITON, O2SATVEN in the last 168 hours.  ABG No results for input(s): PHART, PCO2ART, PO2ART in the last 168 hours.  Liver Enzymes No results for input(s): AST, ALT, ALKPHOS, BILITOT, ALBUMIN in the last 168 hours.  Cardiac Enzymes No results for input(s): TROPONINI, PROBNP in the last 168 hours.  Glucose No results for input(s): GLUCAP in the last 168 hours.  Imaging No results found.  STUDIES:  CT chest 8/15 >> extensive consolidation on Lt, 12 mm RUL nodule Echo 8/16 >> EF 60 to 65%, grade 1 DD, severe RV dilation, mod TR, PAS 58  mmHg Serology 8/19 >> ANA, RF, anti CCP, ANCA all negative Dg Es   10/04/16 > no asp  Sputum cytology 8/25 >>>   CULTURES/micro: Strep urinary antigen 5/16 > neg Strep legionella antigen 8/16 > neg  PCT    8/20  >  Neg  Blood 8/15 >> final neg x2 Sputum 8/16 >> no wbc/ no org seen >  oral flora Sputum for afb >> pending  ANTIBIOTICS: Vanco 8/16 > 8/18 Cefepime 8/16 > 8/24   SIGNIFICANT EVENTS: 8/16 admit with multilobar PNA  ASSESSMENT / PLAN: 76 year old with recurrent pneumonia. Admitted with acute hypoxic respiratory failure, multilobar infiltrates mostly on the left. Workup for interstitial lung disease is negative. We're awaiting sputum cytology, AFB which may be resulted today. If these are negative then she may need a bronchoscopy for further evaluation. I spoke with the patient over  the interpreter and discussed the risks, benefits and she prefers that I discuss this with her daughter as well. I will meet the patient and daughter again later today when they're available. In the meantime continue incentive spirometer, flutter valve, neb treatment as you are doing.  More then 1/2 the time of the 40 min visit was spent in counseling and/or coordination of care with the patient and family.  Marshell Garfinkel MD [facility] Pager [phone] If no answer or after 3pm call: [phone] 10/09/2016, 9:45 AM

## 2016-10-09 NOTE — Progress Notes (Signed)
PROGRESS NOTE  Michelle Mccall  ZOX:[id number] DOB: [birth year] DOA: 09/27/2016 PCP: No primary care provider on file.  Brief Narrative:   76 year old [place] female with history of dyslipidemia, unspecified tachyarrhythmia, multiple episodes of pneumonia in past 9 months, November and December 2017, April 2018. Patient transferred from [facility] with progressive shortness of breath at rest and on exertion. No fevers or chills.  She was treated with steroids, antibiotics, and diuretics with no improvement.  No obvious aspiration noted by SLP.  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  Sputum cytology nondiagnostic.  Scheduled for bronchoscopy on Wednesday.    Assessment & Plan:   Principal Problem:   Sepsis ([place]) Active Problems:   HCAP (healthcare-associated pneumonia)   Hypokalemia   Anemia   Hypoalbuminemia   Tachyarrhythmia   Hyperlipidemia   Acute respiratory failure with hypoxia and hypercapnia (HCC)   Abnormal EKG   Pulmonary infiltrates  Persistent acute respiratory failure with hypoxia with near white-out of the left lung.  Patient is still dyspneic at rest and having copious frothy sputum despite 10-days of IV antibiotics, a course of steroids, and diuresis to the point of hypotension.  History suggests malignancy. -  Sputum sample not adequate for testing -  Cytology from sputum negative -  AFB smear and culture pending -  Appreciate Pulmonology assistance -  Appreciate SLP assistance:  Regular diet with thin liquids -  Patient declined Esophogram  -  Plan for bronchoscopy with BAL or biopsy on Wednesday  Hypokalemia / Hyperkalemia  - Related to sepsis - Potassium WNL  Hypotension, likely due to combination of lasix and resuming diltiazem, resolved.  Episodic SVT with known history of tachyarrhythmia despite starting diltiazem, asymptomatic -  Continue diltiazem -  Holding warfarin for bronchoscopy  Normocytic anemia, hemoglobin  gradually increasing  DVT prophylaxis:  lovenox Code Status:   Full code Family Communication:  Patient with ipad interpreter  Disposition Plan:  PT/OT recommending no follow up. Bronch on Wednesday  Consultants:   PCCM  Procedures:  none  Antimicrobials:  Anti-infectives    Start     Dose/Rate Route Frequency Ordered Stop   10/01/16 1200  ceFEPIme (MAXIPIME) 1 g in dextrose 5 % 50 mL IVPB  Status:  Discontinued     1 g 100 mL/hr over 30 Minutes Intravenous Every 12 hours 10/01/16 1028 10/06/16 1754   09/28/16 2100  vancomycin (VANCOCIN) IVPB 750 mg/150 ml premix  Status:  Discontinued     750 mg 150 mL/hr over 60 Minutes Intravenous Every 24 hours 09/27/16 2102 09/30/16 0647   09/28/16 2100  ceFEPIme (MAXIPIME) 1 g in dextrose 5 % 50 mL IVPB  Status:  Discontinued     1 g 100 mL/hr over 30 Minutes Intravenous Every 24 hours 09/27/16 2104 10/01/16 1028   09/27/16 2200  ceFEPIme (MAXIPIME) 1 g in dextrose 5 % 50 mL IVPB  Status:  Discontinued     1 g 100 mL/hr over 30 Minutes Intravenous Every 8 hours 09/27/16 2050 09/27/16 2104   09/27/16 2100  vancomycin (VANCOCIN) IVPB 1000 mg/200 mL premix     1,000 mg 200 mL/hr over 60 Minutes Intravenous  Once 09/27/16 2050 09/27/16 2245   09/27/16 2057  ceFEPIme (MAXIPIME) 1 g injection    Comments:  Woodward Ku   : cabinet override      09/27/16 2057 09/28/16 0859       Subjective:  Still waking up choking with copious secretions and terrible cough.  Denies  hemoptysis or change in consistency of her sputum.  Thinks that maybe the changes we're seeing on her CXR are due to a distant surgery.    Objective: Vitals:   10/09/16 0816 10/09/16 0935 10/09/16 1355 10/09/16 1503  BP:    90/72  Pulse:    76  Resp:      Temp:    98.5 F (36.9 C)  TempSrc:    Oral  SpO2:  94% 92% 95%  Weight: 52.8 kg (116 lb 6.4 oz)     Height:        Intake/Output Summary (Last 24 hours) at 10/09/16 1832 Last data filed at 10/09/16 1300  Gross  per 24 hour  Intake              360 ml  Output                0 ml  Net              360 ml   Filed Weights   10/07/16 0607 10/08/16 0609 10/09/16 0816  Weight: 56.5 kg (124 lb 9 oz) 54.9 kg (121 lb 0.2 oz) 52.8 kg (116 lb 6.4 oz)    Examination:  General exam:  Adult Female, sitting in chair.  No acute distress.  HEENT:  NCAT, MMM Respiratory system: Diminished with coarse rales throughout the left lung fields, with wheeze and rhonchi Cardiovascular system: Regular rate and rhythm, normal S1/S2. No murmurs, rubs, gallops or clicks.  Warm extremities Gastrointestinal system: Normal active bowel sounds, soft, nondistended, nontender. MSK:  Normal tone and bulk, no lower extremity edema Neuro:  Grossly intact   Data Reviewed: I have personally reviewed following labs and imaging studies  CBC:  Recent Labs Lab 10/03/16 0259 10/04/16 0427 10/05/16 0425 10/06/16 0407 10/09/16 0423  WBC 11.5* 9.8 10.2 10.6* 10.5  HGB 10.7* 12.1 11.3* 12.2 12.0  HCT 33.4* 38.6 35.7* 37.8 37.6  MCV 87.0 86.0 84.2 85.7 83.6  PLT 273 349 311 353 676*   Basic Metabolic Panel:  Recent Labs Lab 10/03/16 0259 10/04/16 0427 10/05/16 0425 10/06/16 0407 10/09/16 0423  NA 138 139 136 138 138  K 4.8 3.8 3.8 4.5 3.8  CL 103 99* 95* 100* 98*  CO2 31 36* 35* 33* 33*  GLUCOSE 133* 89 110* 88 104*  BUN _0 23*  CREATININE 0.36* 0.45 0.48 0.45 0.49  CALCIUM 9.1 8.7* 8.6* 8.8* 9.3   GFR: Estimated Creatinine Clearance: 44.4 mL/min (by C-G formula based on SCr of 0.49 mg/dL). Liver Function Tests: No results for input(s): AST, ALT, ALKPHOS, BILITOT, PROT, ALBUMIN in the last 168 hours. No results for input(s): LIPASE, AMYLASE in the last 168 hours. No results for input(s): AMMONIA in the last 168 hours. Coagulation Profile:  Recent Labs Lab 10/05/16 0425 10/06/16 0407 10/07/16 0359 10/08/16 0424 10/09/16 0423  INR 1.21 1.35 1.84 1.92 1.56   Cardiac Enzymes: No results for  input(s): CKTOTAL, CKMB, CKMBINDEX, TROPONINI in the last 168 hours. BNP (last 3 results) No results for input(s): PROBNP in the last 8760 hours. HbA1C: No results for input(s): HGBA1C in the last 72 hours. CBG: No results for input(s): GLUCAP in the last 168 hours. Lipid Profile: No results for input(s): CHOL, HDL, LDLCALC, TRIG, CHOLHDL, LDLDIRECT in the last 72 hours. Thyroid Function Tests: No results for input(s): TSH, T4TOTAL, FREET4, T3FREE, THYROIDAB in the last 72 hours. Anemia Panel: No results for input(s): VITAMINB12, FOLATE, FERRITIN, TIBC, IRON, RETICCTPCT in  the last 72 hours. Urine analysis: No results found for: COLORURINE, APPEARANCEUR, [place], [place], [place], [place], BILIRUBINUR, [place], [place], UROBILINOGEN, NITRITE, LEUKOCYTESUR Sepsis Labs: _0 (procalcitonin:4,lacticidven:4)  ) Recent Results (from the past 240 hour(s))  Culture, expectorated sputum-assessment     Status: None   Collection Time: 10/07/16 10:25 AM  Result Value Ref Range Status   Specimen Description SPUTUM  Final   Special Requests Normal  Final   Sputum evaluation   Final    Sputum specimen not acceptable for testing.  Please recollect.   NOTIFIED PAM RN [phone] [place]    Report Status 10/09/2016 FINAL  Final      Radiology Studies: Dg Chest 2 View  Result Date: 10/09/2016 CLINICAL DATA:  Acute respiratory failure EXAM: CHEST  2 VIEW COMPARISON:  10/06/2016 FINDINGS: Extensive left lower lobe and lingular airspace disease. 9 mm calcified right mid lung pulmonary nodule likely reflecting sequela prior granulomatous disease. Right lung is clear. Hyperinflated lungs likely reflecting underlying COPD. No pneumothorax. Stable cardiomediastinal silhouette. No acute osseous abnormality. IMPRESSION: 1. Extensive left lower lobe and lingular airspace disease most concerning for pneumonia. Electronically Signed   By: Kathreen Devoid   On: 10/09/2016 10:53     Scheduled Meds: .  acidophilus  1 capsule Oral Daily  . aspirin  81 mg Oral Daily  . diltiazem  180 mg Oral Daily  . enoxaparin (LOVENOX) injection  1 mg/kg Subcutaneous Q12H  . famotidine  20 mg Oral BID  . furosemide  40 mg Oral Daily  . guaiFENesin  1,200 mg Oral BID  . levalbuterol  1.25 mg Nebulization TID  . mouth rinse  15 mL Mouth Rinse BID  . nystatin   Topical TID  . nystatin cream   Topical BID  . predniSONE  20 mg Oral Q breakfast   Continuous Infusions:    LOS: 12 days    Time spent: 30 min    Janece Canterbury, MD [facility] Pager [phone]  If 7PM-7AM, please contact night-coverage [email or web address] Password [facility] 10/09/2016, 6:32 PM

## 2016-10-10 LAB — ACID FAST SMEAR (AFB, MYCOBACTERIA): Acid Fast Smear: NEGATIVE

## 2016-10-10 LAB — CBC
HCT: 38.3 % (ref 36.0–46.0)
HEMOGLOBIN: 12.3 g/dL (ref 12.0–15.0)
MCH: 27 pg (ref 26.0–34.0)
MCHC: 32.1 g/dL (ref 30.0–36.0)
MCV: 84.2 fL (ref 78.0–100.0)
PLATELETS: 391 10*3/uL (ref 150–400)
RBC: 4.55 MIL/uL (ref 3.87–5.11)
RDW: 14.8 % (ref 11.5–15.5)
WBC: 10.1 10*3/uL (ref 4.0–10.5)

## 2016-10-10 LAB — BASIC METABOLIC PANEL
ANION GAP: 7 (ref 5–15)
BUN: 19 mg/dL (ref 6–20)
CALCIUM: 8.8 mg/dL — AB (ref 8.9–10.3)
CO2: 30 mmol/L (ref 22–32)
CREATININE: 0.44 mg/dL (ref 0.44–1.00)
Chloride: 99 mmol/L — ABNORMAL LOW (ref 101–111)
GFR calc non Af Amer: >60 mL/min (ref >60)
Glucose, Bld: 116 mg/dL — ABNORMAL HIGH (ref 65–99)
Potassium: 3.8 mmol/L (ref 3.5–5.1)
SODIUM: 136 mmol/L (ref 135–145)

## 2016-10-10 MED ORDER — POLYETHYLENE GLYCOL 3350 17 G PO PACK
17.0000 g | PACK | Freq: Every day | ORAL | Status: DC
  Administered 2016-10-10 – 2016-10-16 (×7): 17 g via ORAL
  Filled 2016-10-10 (×7): qty 1

## 2016-10-10 MED ORDER — BISACODYL 10 MG RE SUPP
10.0000 mg | Freq: Once | RECTAL | Status: AC
  Administered 2016-10-10: 10 mg via RECTAL
  Filled 2016-10-10: qty 1

## 2016-10-10 MED ORDER — KCL IN DEXTROSE-NACL 10-5-0.45 MEQ/L-%-% IV SOLN
INTRAVENOUS | Status: DC
  Administered 2016-10-10 – 2016-10-12 (×2): via INTRAVENOUS
  Filled 2016-10-10 (×3): qty 1000

## 2016-10-10 MED ORDER — SENNA 8.6 MG PO TABS
2.0000 | ORAL_TABLET | Freq: Every day | ORAL | Status: DC
  Administered 2016-10-10 – 2016-10-15 (×6): 17.2 mg via ORAL
  Filled 2016-10-10 (×6): qty 2

## 2016-10-10 NOTE — Progress Notes (Signed)
PROGRESS NOTE  Michelle Mccall  ZOX:[id number] DOB: 1940/10/15 DOA: 09/27/2016 PCP: No primary care provider on file.  Brief Narrative:   76 year old [place] female with history of dyslipidemia, unspecified tachyarrhythmia, multiple episodes of pneumonia in past 9 months, November and December 2017, April 2018. Patient transferred from [facility] with progressive shortness of breath at rest and on exertion. No fevers or chills.  She was treated with steroids, antibiotics, and diuretics with no improvement.  No obvious aspiration noted by SLP.  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  Sputum cytology nondiagnostic.  Scheduled for bronchoscopy on Wednesday.    Assessment & Plan:   Principal Problem:   Sepsis ([place]) Active Problems:   HCAP (healthcare-associated pneumonia)   Hypokalemia   Anemia   Hypoalbuminemia   Tachyarrhythmia   Hyperlipidemia   Acute respiratory failure with hypoxia and hypercapnia (HCC)   Abnormal EKG   Pulmonary infiltrates  Persistent acute respiratory failure with hypoxia with near white-out of the left lung.  Patient is still dyspneic at rest and having copious frothy sputum despite 10-days of IV antibiotics, a course of steroids, and diuresis to the point of hypotension.  History suggests malignancy. -  Sputum sample not adequate for testing -  Cytology from sputum negative -  AFB smear negative and culture pending -  Appreciate Pulmonology assistance -  Appreciate SLP assistance:  Regular diet with thin liquids -  Patient declined Esophogram  -  Plan for bronchoscopy with BAL and/or biopsy on Wednesday -  NPO at MN, IVF, hold lasix  Hypokalemia / Hyperkalemia  - Related to sepsis - Potassium WNL  Hypotension, likely due to combination of lasix and resuming diltiazem, resolved. -  Holding lasix  Episodic SVT with known history of tachyarrhythmia despite starting diltiazem, asymptomatic -  Continue diltiazem -  Holding  warfarin for bronchoscopy -  Resume telemetry prior to procedure  Normocytic anemia, hemoglobin gradually increasing  Constipation -  Start miralax, senna, bisacodyl suppository  DVT prophylaxis:  lovenox Code Status:   Full code Family Communication:  Patient with ipad interpreter  Disposition Plan:  PT/OT recommending no follow up. Bronch on Wednesday  Consultants:   PCCM  Procedures:  none  Antimicrobials:  Anti-infectives    Start     Dose/Rate Route Frequency Ordered Stop   10/01/16 1200  ceFEPIme (MAXIPIME) 1 g in dextrose 5 % 50 mL IVPB  Status:  Discontinued     1 g 100 mL/hr over 30 Minutes Intravenous Every 12 hours 10/01/16 1028 10/06/16 1754   09/28/16 2100  vancomycin (VANCOCIN) IVPB 750 mg/150 ml premix  Status:  Discontinued     750 mg 150 mL/hr over 60 Minutes Intravenous Every 24 hours 09/27/16 2102 09/30/16 0647   09/28/16 2100  ceFEPIme (MAXIPIME) 1 g in dextrose 5 % 50 mL IVPB  Status:  Discontinued     1 g 100 mL/hr over 30 Minutes Intravenous Every 24 hours 09/27/16 2104 10/01/16 1028   09/27/16 2200  ceFEPIme (MAXIPIME) 1 g in dextrose 5 % 50 mL IVPB  Status:  Discontinued     1 g 100 mL/hr over 30 Minutes Intravenous Every 8 hours 09/27/16 2050 09/27/16 2104   09/27/16 2100  vancomycin (VANCOCIN) IVPB 1000 mg/200 mL premix     1,000 mg 200 mL/hr over 60 Minutes Intravenous  Once 09/27/16 2050 09/27/16 2245   09/27/16 2057  ceFEPIme (MAXIPIME) 1 g injection    Comments:  Woodward Ku   : cabinet override  09/27/16 2057 09/28/16 0859       Subjective:  Still waking up choking with copious secretions and terrible cough.  Denies hemoptysis or change in consistency of her sputum.  Thinks that maybe the changes we're seeing on her CXR are due to a distant surgery.    Objective: Vitals:   10/10/16 0823 10/10/16 1349 10/10/16 1352 10/10/16 1443  BP:  (!) 88/60 (!) 93/56   Pulse:  83    Resp:      Temp:  98 F (36.7 C)    TempSrc:  Oral     SpO2: 94% 95%  92%  Weight:      Height:        Intake/Output Summary (Last 24 hours) at 10/10/16 1936 Last data filed at 10/10/16 1250  Gross per 24 hour  Intake              600 ml  Output                0 ml  Net              600 ml   Filed Weights   10/08/16 0609 10/09/16 0816 10/10/16 0519  Weight: 54.9 kg (121 lb 0.2 oz) 52.8 kg (116 lb 6.4 oz) 52.8 kg (116 lb 6.5 oz)    Examination:  General exam:  Adult female, petite.  No acute distress.  HEENT:  NCAT, MMM Respiratory system:  course rales and diminished throughout the left lung fields, decreased wheeze, persistent rhonchi.  Right lung mostly clear.  Cardiovascular system: Regular rate and rhythm, normal S1/S2. No murmurs, rubs, gallops or clicks.  Warm extremities Gastrointestinal system: Normal active bowel sounds, soft, nondistended, nontender. MSK:  Normal tone and bulk, no lower extremity edema Neuro:  Grossly intact  Data Reviewed: I have personally reviewed following labs and imaging studies  CBC:  Recent Labs Lab 10/04/16 0427 10/05/16 0425 10/06/16 0407 10/09/16 0423 10/10/16 0401  WBC 9.8 10.2 10.6* 10.5 10.1  HGB 12.1 11.3* 12.2 12.0 12.3  HCT 38.6 35.7* 37.8 37.6 38.3  MCV 86.0 84.2 85.7 83.6 84.2  PLT 349 311 353 403* 209   Basic Metabolic Panel:  Recent Labs Lab 10/04/16 0427 10/05/16 0425 10/06/16 0407 10/09/16 0423 10/10/16 0401  NA 139 136 138 138 136  K 3.8 3.8 4.5 3.8 3.8  CL 99* 95* 100* 98* 99*  CO2 36* 35* 33* 33* 30  GLUCOSE 89 110* 88 104* 116*  BUN _0 23* 19  CREATININE 0.45 0.48 0.45 0.49 0.44  CALCIUM 8.7* 8.6* 8.8* 9.3 8.8*   GFR: Estimated Creatinine Clearance: 44.4 mL/min (by C-G formula based on SCr of 0.44 mg/dL). Liver Function Tests: No results for input(s): AST, ALT, ALKPHOS, BILITOT, PROT, ALBUMIN in the last 168 hours. No results for input(s): LIPASE, AMYLASE in the last 168 hours. No results for input(s): AMMONIA in the last 168  hours. Coagulation Profile:  Recent Labs Lab 10/05/16 0425 10/06/16 0407 10/07/16 0359 10/08/16 0424 10/09/16 0423  INR 1.21 1.35 1.84 1.92 1.56   Cardiac Enzymes: No results for input(s): CKTOTAL, CKMB, CKMBINDEX, TROPONINI in the last 168 hours. BNP (last 3 results) No results for input(s): PROBNP in the last 8760 hours. HbA1C: No results for input(s): HGBA1C in the last 72 hours. CBG: No results for input(s): GLUCAP in the last 168 hours. Lipid Profile: No results for input(s): CHOL, HDL, LDLCALC, TRIG, CHOLHDL, LDLDIRECT in the last 72 hours. Thyroid Function Tests: No results  for input(s): TSH, T4TOTAL, FREET4, T3FREE, THYROIDAB in the last 72 hours. Anemia Panel: No results for input(s): VITAMINB12, FOLATE, FERRITIN, TIBC, IRON, RETICCTPCT in the last 72 hours. Urine analysis: No results found for: COLORURINE, APPEARANCEUR, [place], [place], [place], [place], [place], [place], [place], [place], NITRITE, LEUKOCYTESUR Sepsis Labs: _0 (procalcitonin:4,lacticidven:4)  ) Recent Results (from the past 240 hour(s))  Fungus Culture With Stain     Status: None (Preliminary result)   Collection Time: 10/06/16  5:41 PM  Result Value Ref Range Status   Fungus Stain Final report  Final    Comment: (NOTE) Performed At: [facility] [place], [place] [id number] Lindon Romp MD WY:[id number]    Fungus (Mycology) Culture PENDING  Incomplete   Fungal Source EXPECTORATED SPUTUM  Final  Acid Fast Smear (AFB)     Status: None   Collection Time: 10/06/16  5:41 PM  Result Value Ref Range Status   AFB Specimen Processing Concentration  Final   Acid Fast Smear Negative  Final    Comment: (NOTE) Performed At: [facility] [place], [place] [id number] Lindon Romp MD EM:[id number]    Source (AFB) EXPECTORATED SPUTUM  Final  Fungus Culture Result     Status: None   Collection Time: 10/06/16  5:41 PM  Result  Value Ref Range Status   Result 1 Comment  Final    Comment: (NOTE) Fungal elements, such as arthroconidia, hyphal fragments, chlamydoconidia, observed. Performed At: [facility] [address] [place], [place] [id number] Lindon Romp MD [id number]   Culture, expectorated sputum-assessment     Status: None   Collection Time: 10/07/16 10:25 AM  Result Value Ref Range Status   Specimen Description SPUTUM  Final   Special Requests Normal  Final   Sputum evaluation   Final    Sputum specimen not acceptable for testing.  Please recollect.   NOTIFIED PAM RN [phone] [place]    Report Status 10/09/2016 FINAL  Final      Radiology Studies: Dg Chest 2 View  Result Date: 10/09/2016 CLINICAL DATA:  Acute respiratory failure EXAM: CHEST  2 VIEW COMPARISON:  10/06/2016 FINDINGS: Extensive left lower lobe and lingular airspace disease. 9 mm calcified right mid lung pulmonary nodule likely reflecting sequela prior granulomatous disease. Right lung is clear. Hyperinflated lungs likely reflecting underlying COPD. No pneumothorax. Stable cardiomediastinal silhouette. No acute osseous abnormality. IMPRESSION: 1. Extensive left lower lobe and lingular airspace disease most concerning for pneumonia. Electronically Signed   By: Kathreen Devoid   On: 10/09/2016 10:53     Scheduled Meds: . acidophilus  1 capsule Oral Daily  . aspirin  81 mg Oral Daily  . bisacodyl  10 mg Rectal Once  . diltiazem  180 mg Oral Daily  . famotidine  20 mg Oral BID  . guaiFENesin  1,200 mg Oral BID  . levalbuterol  1.25 mg Nebulization TID  . mouth rinse  15 mL Mouth Rinse BID  . nystatin   Topical TID  . nystatin cream   Topical BID  . polyethylene glycol  17 g Oral Daily  . predniSONE  20 mg Oral Q breakfast  . senna  2 tablet Oral QHS   Continuous Infusions: . dextrose 5 % and 0.45 % NaCl with KCl 10 mEq/L 50 mL/hr at 10/10/16 1844     LOS: 13 days    Time spent: 30 min    Janece Canterbury, MD [facility] Pager [phone]  If 7PM-7AM, please contact  night-coverage [email or web address] Password [facility] 10/10/2016, 7:36 PM

## 2016-10-10 NOTE — Progress Notes (Signed)
PULMONARY / CRITICAL CARE MEDICINE   Name: Michelle Mccall MRN: [id number] DOB: 08/02/1940    ADMISSION DATE:  09/27/2016 CONSULTATION DATE: 09/28/2016  REFERRING MD:  Neil Crouch MD  CHIEF COMPLAINT:  Sepsis, pneumonia  HISTORY OF PRESENT ILLNESS:   62 yowf [place]/ never smoker  with recurrent episodes of pneumonia vs persistent AS dz LLL dating back to at least 05/29/16 raising possibility of BAC.  She is an immigrant from [place]. 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 She had a spine surgery in the 1960s and had some kind of lung issue after that. She is a never smoker. 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 She denies any exposure to TB in the past.  SUBJECTIVE:  States that cough and mucus is better today  VITAL SIGNS: BP 100/62 (BP Location: Left Arm)   Pulse 94   Temp 99.2 F (37.3 C) (Oral)   Resp 18   Ht 5\' 1"  (1.549 m)   Wt 52.8 kg (116 lb 6.5 oz)   SpO2 94%   BMI 21.99 kg/m   INTAKE / OUTPUT: I/O last 3 completed shifts: In: 86 [P.O.:720] Out: -   PHYSICAL EXAMINATION:  Wt Readings from Last 3 Encounters:  10/10/16 52.8 kg (116 lb 6.5 oz)     Gen:      No acute distress HEENT:  EOMI, sclera anicteric Neck:     No masses; no thyromegaly Lungs:    B/L crackles. Greater on lt base; normal respiratory effort CV:         Regular rate and rhythm; no murmurs Abd:      + bowel sounds; soft, non-tender; no palpable masses, no distension Ext:    No edema; adequate peripheral perfusion Skin:      Warm and dry; no rash Neuro: alert and oriented x 3 Psych: normal mood and affect  LABS:  BMET  Recent Labs Lab 10/06/16 0407 10/09/16 0423 10/10/16 0401  NA 138 138 136  K 4.5 3.8 3.8  CL 100* 98* 99*  CO2 33* 33* 30  BUN 16 23* 19  CREATININE 0.45 0.49 0.44  GLUCOSE 88 104* 116*    Electrolytes  Recent  Labs Lab 10/06/16 0407 10/09/16 0423 10/10/16 0401  CALCIUM 8.8* 9.3 8.8*    CBC  Recent Labs Lab 10/06/16 0407 10/09/16 0423 10/10/16 0401  WBC 10.6* 10.5 10.1  HGB 12.2 12.0 12.3  HCT 37.8 37.6 38.3  PLT 353 403* 391    Coag's  Recent Labs Lab 10/07/16 0359 10/08/16 0424 10/09/16 0423  INR 1.84 1.92 1.56    Sepsis Markers No results for input(s): LATICACIDVEN, PROCALCITON, O2SATVEN in the last 168 hours.  ABG No results for input(s): PHART, PCO2ART, PO2ART in the last 168 hours.  Liver Enzymes No results for input(s): AST, ALT, ALKPHOS, BILITOT, ALBUMIN in the last 168 hours.  Cardiac Enzymes No results for input(s): TROPONINI, PROBNP in the last 168 hours.  Glucose No results for input(s): GLUCAP in the last 168 hours.  Imaging Dg Chest 2 View  Result Date: 10/09/2016 CLINICAL DATA:  Acute respiratory failure EXAM: CHEST  2 VIEW COMPARISON:  10/06/2016 FINDINGS: Extensive left lower lobe and lingular airspace disease. 9 mm calcified right mid lung pulmonary nodule likely reflecting sequela prior granulomatous disease. Right lung is clear. Hyperinflated lungs likely reflecting underlying COPD. No pneumothorax. Stable cardiomediastinal silhouette. No  acute osseous abnormality. IMPRESSION: 1. Extensive left lower lobe and lingular airspace disease most concerning for pneumonia. Electronically Signed   By: Kathreen Devoid   On: 10/09/2016 10:53    STUDIES:  CT chest 8/15 >> extensive consolidation on Lt, 12 mm RUL nodule Echo 8/16 >> EF 60 to 65%, grade 1 DD, severe RV dilation, mod TR, PAS 58 mmHg Serology 8/19 >> ANA, RF, anti CCP, ANCA all negative Dg Es   10/04/16 > no asp  Sputum cytology 8/25 >>>   CULTURES/micro: Strep urinary antigen 5/16 > neg Strep legionella antigen 8/16 > neg  PCT    8/20  >  Neg  Blood 8/15 >> final neg x2 Sputum 8/16 >> no wbc/ no org seen >  oral flora Sputum for afb >> pending  ANTIBIOTICS: Vanco 8/16 > 8/18 Cefepime  8/16 > 8/24   SIGNIFICANT EVENTS: 8/16 admit with multilobar PNA  ASSESSMENT / PLAN: 76 year old with recurrent pneumonia. Admitted with acute hypoxic respiratory failure, multilobar infiltrates mostly on the left. Workup for interstitial lung disease is negative. Sputum cytology and AFB smear are negative. 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 Risk and benefit of bronchoscope was discussed with the pt  They have agreed to proceed. She has been scheduled for procedure on 8/29. Continue incentive spirometer, flutter valve, neb treatment as you are doing. Hold lovenox  More then 1/2 the time of the 40 min visit was spent in counseling and/or coordination of care with the patient and family.  Marshell Garfinkel MD [facility] Pager [phone] If no answer or after 3pm call: [phone] 10/10/2016, 10:40 AM

## 2016-10-10 NOTE — Progress Notes (Signed)
  Speech Language Pathology Treatment: Dysphagia  Patient Details Name: Michelle Mccall MRN: [id number] DOB: 06/18/1940 Today's Date: 10/10/2016 Time: 2103-1281 SLP Time Calculation (min) (ACUTE ONLY): 20 min  Assessment / Plan / Recommendation Clinical Impression  This SLP reviewed chart re: swallow assessments and recommendations thus far and utilized video interpreter during session. Barium esophagram performed 8/22 with the following findings per transcribed report: "right anterior and right lateral decubitus views during swallowing demonstrate no evidence of aspiration or significant penetration and impression: no evidence of aspiration". No mention of esophageal function in this report. Pt has cup at beside bed halfway filled with expectorated mucous sometimes indicative of esophageal impairment. Pt is cognitively intact and denied coughing with solid/liquids (although revealed at end of initial BSE that "more she eats more she coughs") and this therapist agrees with primary SLP's suspicion of esophageal dysfunction. No indications of pharyngeal dysphagia or compromised airway during consumption of water and cracker during session. Reviewed esophageal precautions with pt who verbalized understanding. Not much more to offer at present. Recommend continue regular texture and thin liquids. Will discharge from caseload.    HPI HPI: 76 yo female who speaks [place] admitted to [facility] with coughing, concern for pna - referred for swallow evaluation.  SLP used interpreter online [name] [phone]) for entire session.  PMH + for pneumonia, sinus tachycardia, diffuse lung infiltrates 8/21.  Pt states she was at a hospital in [place] and had an endoscopy test done - Noted FEES 06/02/16 with mild dysphagia, no aspiration/penetration.        SLP Plan  All goals met;Discharge SLP treatment due to (comment)       Recommendations  Diet recommendations: Regular;Thin liquid Liquids provided via: Cup Medication  Administration: Whole meds with liquid Supervision: Patient able to self feed Compensations: Slow rate;Small sips/bites;Lingual sweep for clearance of pocketing Postural Changes and/or Swallow Maneuvers: Seated upright 90 degrees;Upright 30-60 min after meal                Oral Care Recommendations: Oral care BID Follow up Recommendations: None SLP Visit Diagnosis: Dysphagia, unspecified (R13.10) Plan: All goals met;Discharge SLP treatment due to (comment)       GO                [place] Siren 10/10/2016, 4:31 PM   Orbie Pyo Colvin Caroli.Ed [employer] [phone]

## 2016-10-10 NOTE — Progress Notes (Signed)
Physical Therapy Treatment Patient Details Name: Michelle Mccall MRN: [id number] DOB: Dec 06, 1940 Today's Date: 10/10/2016    History of Present Illness Pt admitted with dx sepsis 2* recurrent PNA    PT Comments    SATURATION QUALIFICATIONS: (This note is used to comply with regulatory documentation for home oxygen)  Patient Saturations on Room Air at Rest = 87%  Patient Saturations on Room Air while Ambulating 325 feet = 83% (was 81% yesterday)  Patient Saturations on 2 Liters of oxygen while Ambulating = 90%  Please briefly explain why patient needs home oxygen:  Pt required supplemental oxygen to achieve therapeutic level  Follow Up Recommendations  No PT follow up     Equipment Recommendations      Recommendations for Other Services       Precautions / Restrictions Precautions Precaution Comments: monitor sats/likes to wear shoes/speaks [place] Restrictions Weight Bearing Restrictions: No    Mobility  Bed Mobility Overal bed mobility: Modified Independent             General bed mobility comments: pt sitting EOB Indep on arrival  Transfers Overall transfer level: Needs assistance Equipment used: None Transfers: Sit to/from [facility] Sit to Stand: Supervision Stand pivot transfers: Supervision       General transfer comment: Supervision for environmental safety and O2 tubing.  Good use of hands to steady self  Ambulation/Gait Ambulation/Gait assistance: Supervision Ambulation Distance (Feet): 325 Feet Assistive device: None Gait Pattern/deviations: Step-through pattern;Decreased stride length;Trunk flexed Gait velocity: WFL   General Gait Details: amb without AD.  Good alternating gait and functional spped.  Amb on RA decreased to 83%.  Reapplied 2 lts to achieve 90% and visually instructed on purse lip breathing/encouraged coughing.     Stairs            Wheelchair Mobility    Modified Rankin (Stroke Patients Only)       Balance                                            Cognition Arousal/Alertness: Awake/alert Behavior During Therapy: WFL for tasks assessed/performed Overall Cognitive Status: Within Functional Limits for tasks assessed                                 General Comments: speaks some English      Exercises      General Comments        Pertinent Vitals/Pain Pain Assessment: No/denies pain    Home Living                      Prior Function            PT Goals (current goals can now be found in the care plan section) Progress towards PT goals: Progressing toward goals    Frequency    Min 3X/week      PT Plan Current plan remains appropriate    Co-evaluation              AM-PAC PT "6 Clicks" Daily Activity  Outcome Measure    Difficulty moving from lying on back to sitting on the side of the bed? : A Little Difficulty sitting down on and standing up from a chair with arms (e.g., wheelchair, bedside commode, etc,.)?: A Little Help needed  moving to and from a bed to chair (including a wheelchair)?: A Little Help needed walking in hospital room?: A Little Help needed climbing 3-5 steps with a railing? : A Little 6 Click Score: 15    End of Session Equipment Utilized During Treatment: Gait belt;Oxygen Activity Tolerance: Patient tolerated treatment well Patient left: with call bell/phone within reach;with chair alarm set;in bed Nurse Communication: Mobility status PT Visit Diagnosis: Unsteadiness on feet (R26.81);Muscle weakness (generalized) (M62.81)     Time: 2751-7001 PT Time Calculation (min) (ACUTE ONLY): 24 min  Charges:  $Gait Training: 8-22 mins $Therapeutic Activity: 8-22 mins                    G Codes:       Rica Koyanagi  PTA [facility]  Acute  Rehab Pager      [phone]

## 2016-10-11 ENCOUNTER — Encounter (HOSPITAL_COMMUNITY): Payer: Self-pay | Admitting: Respiratory Therapy

## 2016-10-11 ENCOUNTER — Encounter (HOSPITAL_COMMUNITY)
Admission: EM | Disposition: A | Discharge status: Home / Self Care | Payer: Self-pay | Source: Home / Self Care | Attending: Internal Medicine

## 2016-10-11 ENCOUNTER — Inpatient Hospital Stay (HOSPITAL_COMMUNITY): Payer: Medicaid Other

## 2016-10-11 HISTORY — PX: VIDEO BRONCHOSCOPY: SHX5072

## 2016-10-11 LAB — BASIC METABOLIC PANEL
Anion gap: 7 (ref 5–15)
BUN: 17 mg/dL (ref 6–20)
CALCIUM: 9.1 mg/dL (ref 8.9–10.3)
CO2: 28 mmol/L (ref 22–32)
CREATININE: 0.52 mg/dL (ref 0.44–1.00)
Chloride: 99 mmol/L — ABNORMAL LOW (ref 101–111)
GFR calc non Af Amer: >60 mL/min (ref >60)
Glucose, Bld: 160 mg/dL — ABNORMAL HIGH (ref 65–99)
Potassium: 3.9 mmol/L (ref 3.5–5.1)
SODIUM: 134 mmol/L — AB (ref 135–145)

## 2016-10-11 LAB — BODY FLUID CELL COUNT WITH DIFFERENTIAL
LYMPHS FL: 14 %
MONOCYTE-MACROPHAGE-SEROUS FLUID: 5 % — AB (ref 50–90)
NEUTROPHIL FLUID: 81 % — AB (ref 0–25)
Total Nucleated Cell Count, Fluid: 52 cu mm (ref 0–1000)

## 2016-10-11 LAB — CBC
HCT: 36.3 % (ref 36.0–46.0)
Hemoglobin: 11.9 g/dL — ABNORMAL LOW (ref 12.0–15.0)
MCH: 27.8 pg (ref 26.0–34.0)
MCHC: 32.8 g/dL (ref 30.0–36.0)
MCV: 84.8 fL (ref 78.0–100.0)
PLATELETS: 360 10*3/uL (ref 150–400)
RBC: 4.28 MIL/uL (ref 3.87–5.11)
RDW: 14.8 % (ref 11.5–15.5)
WBC: 12.6 10*3/uL — AB (ref 4.0–10.5)

## 2016-10-11 SURGERY — BRONCHOSCOPY, WITH FLUOROSCOPY
Anesthesia: Moderate Sedation | Laterality: Bilateral

## 2016-10-11 MED ORDER — LIDOCAINE HCL 2 % EX GEL
CUTANEOUS | Status: DC | PRN
  Administered 2016-10-11: 1

## 2016-10-11 MED ORDER — LIDOCAINE HCL 2 % EX GEL: 1.0000 "application " | Freq: Once | CUTANEOUS | Status: DC

## 2016-10-11 MED ORDER — FENTANYL CITRATE (PF) 100 MCG/2ML IJ SOLN
INTRAMUSCULAR | Status: AC
  Filled 2016-10-11: qty 4

## 2016-10-11 MED ORDER — WARFARIN SODIUM 5 MG PO TABS
5.0000 mg | ORAL_TABLET | Freq: Once | ORAL | Status: AC
  Administered 2016-10-11: 5 mg via ORAL
  Filled 2016-10-11: qty 1

## 2016-10-11 MED ORDER — MIDAZOLAM HCL 10 MG/2ML IJ SOLN
INTRAMUSCULAR | Status: DC | PRN
  Administered 2016-10-11: 1 mg via INTRAVENOUS
  Administered 2016-10-11: .5 mg via INTRAVENOUS

## 2016-10-11 MED ORDER — PHENYLEPHRINE HCL 0.25 % NA SOLN
NASAL | Status: DC | PRN
  Administered 2016-10-11: 2

## 2016-10-11 MED ORDER — FENTANYL CITRATE (PF) 100 MCG/2ML IJ SOLN
INTRAMUSCULAR | Status: DC | PRN
  Administered 2016-10-11: 25 ug via INTRAVENOUS

## 2016-10-11 MED ORDER — WARFARIN - PHARMACIST DOSING INPATIENT: Freq: Every day | Status: DC

## 2016-10-11 MED ORDER — SODIUM CHLORIDE 0.9 % IV SOLN
INTRAVENOUS | Status: DC
  Administered 2016-10-11 – 2016-10-13 (×2): via INTRAVENOUS

## 2016-10-11 MED ORDER — PHENYLEPHRINE HCL 0.25 % NA SOLN: 1.0000 | Freq: Four times a day (QID) | NASAL | Status: DC | PRN

## 2016-10-11 MED ORDER — BUTAMBEN-TETRACAINE-BENZOCAINE 2-2-14 % EX AERO: 1.0000 | INHALATION_SPRAY | Freq: Once | CUTANEOUS | Status: DC

## 2016-10-11 MED ORDER — DOCUSATE SODIUM 100 MG PO CAPS
100.0000 mg | ORAL_CAPSULE | Freq: Every day | ORAL | Status: DC
  Administered 2016-10-11 – 2016-10-16 (×6): 100 mg via ORAL
  Filled 2016-10-11 (×6): qty 1

## 2016-10-11 MED ORDER — MIDAZOLAM HCL 5 MG/ML IJ SOLN
INTRAMUSCULAR | Status: AC
  Filled 2016-10-11: qty 2

## 2016-10-11 MED ORDER — LIDOCAINE HCL 1 % IJ SOLN
INTRAMUSCULAR | Status: DC | PRN
  Administered 2016-10-11: 6 mL via RESPIRATORY_TRACT

## 2016-10-11 NOTE — Progress Notes (Signed)
Video Bronchoscopy done  Intervention Bronchial washing Intervention Bronchial biopsy Intervention Bronchial brushings   Procedure tolerated well

## 2016-10-11 NOTE — Op Note (Signed)
[facility] Cardiopulmonary Patient Name: Michelle Mccall Procedure Date: 10/11/2016 MRN: [id number] Attending MD: Marshell Garfinkel , MD Date of Birth: 1940/10/17 CSN: [id number] Age: 76 Admit Type: Inpatient Ethnicity: Not Hispanic or Latino Procedure:            Bronchoscopy Indications:          Atelectasis of the left lower lobe Providers:            Marshell Garfinkel, MD, Andre Lefort RRT,RCP, Ashley Mariner RRT,RCP Referring MD:          Medicines:            Midazolam 1.5 mg IV, Fentanyl 25 mcg IV Complications:        No immediate complications Estimated Blood Loss: Estimated blood loss was minimal. Procedure:      Pre-Anesthesia Assessment:      - A History and Physical has been performed. Patient meds and allergies       have been reviewed. The risks and benefits of the procedure and the       sedation options and risks were discussed with the patient. All       questions were answered and informed consent was obtained. Patient       identification and proposed procedure were verified prior to the       procedure by the physician in the procedure room. Mental Status       Examination: alert and oriented. Airway Examination: small/crowded       oropharyngeal airway. Respiratory Examination: bibasilar crackles. CV       Examination: RRR, no murmurs, no S3 or S4. ASA Grade Assessment: II - A       patient with mild systemic disease. After reviewing the risks and       benefits, the patient was deemed in satisfactory condition to undergo       the procedure. The anesthesia plan was to use moderate sedation /       analgesia (conscious sedation). Immediately prior to administration of       medications, the patient was re-assessed for adequacy to receive       sedatives. The heart rate, respiratory rate, oxygen saturations, blood       pressure, adequacy of pulmonary ventilation, and response to care were       monitored throughout the  procedure. The physical status of the patient       was re-assessed after the procedure.      After obtaining informed consent, the bronchoscope was passed under       direct vision. Throughout the procedure, the patient's blood pressure,       pulse, and oxygen saturations were monitored continuously. the [id number]       [id number] scope was introduced through the right nostril and advanced to       the tracheobronchial tree of both lungs. Findings:      The nasopharynx/oropharynx appears normal. The larynx appears normal.       The vocal cords appear normal. The subglottic space is normal. The       trachea is of normal caliber. The carina is sharp. The tracheobronchial       tree was examined to at least the first subsegmental level. Bronchial       mucosa and anatomy are normal; there are no endobronchial  lesions, thin       frothy seceretions noted from Lt lung and LLL which were suctioned off.      Bronchoalveolar lavage was performed in the LLL lateral basal segments       (B9) of the lung and sent for cell count, bacterial culture, viral       smears & culture, and fungal & AFB analysis and cytology. 180 mL of       fluid were instilled. 100 mL were returned. The return was mucoid. There       were no mucoid plugs in the return fluid. Multiple specimens were       obtained and pooled into one specimen, which was sent for analysis.      Fluoroscopically guided transbronchial brushings of an area of       infiltration were obtained in the lateral basal segment of the left       lower lobe with a cytology brush and sent for routine cytology. Four       samples were obtained.      A transbronchial biopsy of an area of infiltration was performed in the       lateral basal segment of the left lower lobe using alligator forceps and       sent for histopathology examination. The procedure was guided by       fluoroscopy. Transbronchial biopsy technique was selected because the       sampling  site was not visible endoscopically. One biopsy pass was       performed. One biopsy sample was obtained. Impression:      - Thin seceretions from lt lung, normal airway anatomy      - Bronchoalveolar lavage was performed.      - Fluoroscopically guided transbronchial brushings were obtained.      - Transbronchial lung biopsies were performed. Moderate Sedation:      Moderate (conscious) sedation was administered by the endoscopy nurse       and supervised by the endoscopist. The following parameters were       monitored: oxygen saturation, heart rate, blood pressure, respiratory       rate, EKG, adequacy of pulmonary ventilation, and response to care.       Total physician intraservice time was 25 minutes. Recommendation:      - Await BAL, biopsy and brushing results. Procedure Code(s):      --- Professional ---      [phone], Bronchoscopy, rigid or flexible, including fluoroscopic guidance,       when performed; with transbronchial lung biopsy(s), single lobe      83382, Bronchoscopy, rigid or flexible, including fluoroscopic guidance,       when performed; with bronchial alveolar lavage      [phone], Bronchoscopy, rigid or flexible, including fluoroscopic guidance,       when performed; with brushing or protected brushings      99152, Moderate sedation services provided by the same physician or       other qualified health care professional performing the diagnostic or       therapeutic service that the sedation supports, requiring the presence       of an independent trained observer to assist in the monitoring of the       patient's level of consciousness and physiological status; initial 15       minutes of intraservice time, patient age 52 years or older      99153, Moderate sedation services;  each additional 15 minutes       intraservice time Diagnosis Code(s):      --- Professional ---      R91.8, Other nonspecific abnormal finding of lung field CPT copyright 2016 American  Medical Association. All rights reserved. The codes documented in this report are preliminary and upon coder review may  be revised to meet current compliance requirements. Marshell Garfinkel, MD 10/11/2016 10:08:39 AM Number of Addenda: 0 Scope In: 9:27:45 AM Scope Out: 9:45:23 AM

## 2016-10-11 NOTE — Progress Notes (Signed)
PROGRESS NOTE  Michelle Mccall  HDQ:[id number] DOB: [birth year] DOA: 09/27/2016   PCP: No primary care provider on file.  Brief Narrative:  76 year old [place] female with history of dyslipidemia, unspecified tachyarrhythmia, multiple episodes of pneumonia in past 9 months, November and December 2017, April 2018. Patient transferred from [facility] with progressive shortness of breath at rest and on exertion. No fevers or chills.  She was treated with steroids, antibiotics, and diuretics with no improvement.  No obvious aspiration noted by SLP. Sputum cytology nondiagnostic.   Assessment & Plan:  Persistent acute respiratory failure with hypoxia with near white-out of the left lung - still with productive cough and exertional dyspnea, completed 10 days of IV ABX, course of steroids, diuresis - AFB smear negative, cultures pending  - broncho done today 8/29 - normal airway anatomy, BAL performed, bx taken, await for final results   Hypokalemia / Hyperkalemia  - has stabilized and is WNL this AM  Hypotension - likely due to combination of lasix and resuming diltiazem, resolved. - Cardizem resumed but lasix still on hold  - may need to hold Cardizem as well if SBP < 90  Episodic SVT with known history of tachyarrhythmia despite starting diltiazem, asymptomatic - Continue diltiazem - Holding warfarin fas bronch was done this AM, resume coumadin   Normocytic anemia - Hg overall stable with no evidence of active bleeding - CBC in AM  Constipation - keep on bowel regimen, pt did have BM yesterday   DVT prophylaxis:  lovenox Code Status:   Full code Family Communication:  Pt with ipad interpreter  Disposition Plan:  Home when cleared by PCCM   Consultants:   PCCM  Procedures:   Bronch 8/29   Antimicrobials:  Anti-infectives    Start     Dose/Rate Route Frequency Ordered Stop   10/01/16 1200  ceFEPIme (MAXIPIME) 1 g in dextrose 5 % 50 mL IVPB  Status:  Discontinued     1  g 100 mL/hr over 30 Minutes Intravenous Every 12 hours 10/01/16 1028 10/06/16 1754   09/28/16 2100  vancomycin (VANCOCIN) IVPB 750 mg/150 ml premix  Status:  Discontinued     750 mg 150 mL/hr over 60 Minutes Intravenous Every 24 hours 09/27/16 2102 09/30/16 0647   09/28/16 2100  ceFEPIme (MAXIPIME) 1 g in dextrose 5 % 50 mL IVPB  Status:  Discontinued     1 g 100 mL/hr over 30 Minutes Intravenous Every 24 hours 09/27/16 2104 10/01/16 1028   09/27/16 2200  ceFEPIme (MAXIPIME) 1 g in dextrose 5 % 50 mL IVPB  Status:  Discontinued     1 g 100 mL/hr over 30 Minutes Intravenous Every 8 hours 09/27/16 2050 09/27/16 2104   09/27/16 2100  vancomycin (VANCOCIN) IVPB 1000 mg/200 mL premix     1,000 mg 200 mL/hr over 60 Minutes Intravenous  Once 09/27/16 2050 09/27/16 2245   09/27/16 2057  ceFEPIme (MAXIPIME) 1 g injection    Comments:  Woodward Ku   : cabinet override      09/27/16 2057 09/28/16 0859     Subjective: Still concerned with cough and dyspnea.   Objective: Vitals:   10/11/16 1050 10/11/16 1055 10/11/16 1100 10/11/16 1105  BP: 106/67 105/63 (!) 97/59   Pulse:      Resp: (!) 26 (!) 24 (!) 30   Temp:      TempSrc:      SpO2: 91% 91% 92% 94%  Weight:      Height:  Intake/Output Summary (Last 24 hours) at 10/11/16 1235 Last data filed at 10/11/16 0900  Gross per 24 hour  Intake           558.33 ml  Output              103 ml  Net           455.33 ml   Filed Weights   10/09/16 0816 10/10/16 0519 10/11/16 0601  Weight: 52.8 kg (116 lb 6.4 oz) 52.8 kg (116 lb 6.5 oz) 55.6 kg (122 lb 9.2 oz)    Physical Exam  Constitutional: Appears alert, calm, NAD CVS: RRR, S1/S2 +, no murmurs, no gallops, no carotid bruit.  Pulmonary: course breath sounds bilaterally with ext wheezing and bilateral rhonchi  Abdominal: Soft. BS +,  no distension, tenderness, rebound or guarding.  Musculoskeletal: Normal range of motion. No edema and no tenderness.  Neuro: Alert. Normal  reflexes, muscle tone coordination. No cranial nerve deficits   Data Reviewed: I have personally reviewed following labs and imaging studies  CBC:  Recent Labs Lab 10/05/16 0425 10/06/16 0407 10/09/16 0423 10/10/16 0401 10/11/16 0357  WBC 10.2 10.6* 10.5 10.1 12.6*  HGB 11.3* 12.2 12.0 12.3 11.9*  HCT 35.7* 37.8 37.6 38.3 36.3  MCV 84.2 85.7 83.6 84.2 84.8  PLT 311 353 403* 391 672   Basic Metabolic Panel:  Recent Labs Lab 10/05/16 0425 10/06/16 0407 10/09/16 0423 10/10/16 0401 10/11/16 0357  NA 136 138 138 136 134*  K 3.8 4.5 3.8 3.8 3.9  CL 95* 100* 98* 99* 99*  CO2 35* 33* 33* 30 28  GLUCOSE 110* 88 104* 116* 160*  BUN 18 16 23* 19 17  CREATININE 0.48 0.45 0.49 0.44 0.52  CALCIUM 8.6* 8.8* 9.3 8.8* 9.1   Coagulation Profile:  Recent Labs Lab 10/05/16 0425 10/06/16 0407 10/07/16 0359 10/08/16 0424 10/09/16 0423  INR 1.21 1.35 1.84 1.92 1.56   Recent Results (from the past 240 hour(s))  Fungus Culture With Stain     Status: None (Preliminary result)   Collection Time: 10/06/16  5:41 PM  Result Value Ref Range Status   Fungus Stain Final report  Final    Comment: (NOTE) Performed At: [facility] [place], [place] [id number] Lindon Romp MD ZM:[id number]    Fungus (Mycology) Culture PENDING  Incomplete   Fungal Source EXPECTORATED SPUTUM  Final  Acid Fast Smear (AFB)     Status: None   Collection Time: 10/06/16  5:41 PM  Result Value Ref Range Status   AFB Specimen Processing Concentration  Final   Acid Fast Smear Negative  Final    Comment: (NOTE) Performed At: [facility] [place], [place] [id number] Lindon Romp MD [id number]    Source (AFB) EXPECTORATED SPUTUM  Final  Fungus Culture Result     Status: None   Collection Time: 10/06/16  5:41 PM  Result Value Ref Range Status   Result 1 Comment  Final    Comment: (NOTE) Fungal elements, such as arthroconidia, hyphal  fragments, chlamydoconidia, observed. Performed At: [facility] [address] [place], [place] [id number] Lindon Romp MD WG:[id number]   Culture, expectorated sputum-assessment     Status: None   Collection Time: 10/07/16 10:25 AM  Result Value Ref Range Status   Specimen Description SPUTUM  Final   Special Requests Normal  Final   Sputum evaluation   Final    Sputum specimen not acceptable for testing.  Please recollect.   NOTIFIED PAM RN [phone] [place]    Report Status 10/09/2016 FINAL  Final    Radiology Studies: Dg Chest Port 1 View  Result Date: 10/11/2016 CLINICAL DATA:  Left lower lobe lung biopsy. EXAM: PORTABLE CHEST 1 VIEW COMPARISON:  10/09/2016. FINDINGS: Cardiomegaly. Diffuse left lung infiltrate. Calcified pulmonary nodule right mid lung consistent with granuloma . Left-sided pleural effusion. Small right pleural effusion.No pneumothorax. Cardiomegaly. No pulmonary venous congestion . No acute bony abnormality. IMPRESSION: 1. Diffuse left lung field infiltrate and left-sided pleural effusion again noted. 2. Mild right base infiltrate cannot be excluded. Small right pleural effusion. 3. Cardiomegaly.  No pulmonary venous congestion. Electronically Signed   By: Marcello Moores  Register   On: 10/11/2016 10:04   Dg C-arm Bronchoscopy  Result Date: 10/11/2016 C-ARM BRONCHOSCOPY: Fluoroscopy was utilized by the requesting physician.  No radiographic interpretation.     Scheduled Meds: . acidophilus  1 capsule Oral Daily  . aspirin  81 mg Oral Daily  . diltiazem  180 mg Oral Daily  . famotidine  20 mg Oral BID  . guaiFENesin  1,200 mg Oral BID  . levalbuterol  1.25 mg Nebulization TID  . mouth rinse  15 mL Mouth Rinse BID  . nystatin   Topical TID  . nystatin cream   Topical BID  . polyethylene glycol  17 g Oral Daily  . predniSONE  20 mg Oral Q breakfast  . senna  2 tablet Oral QHS   Continuous Infusions: . sodium chloride 10 mL/hr at 10/11/16 0900  .  dextrose 5 % and 0.45 % NaCl with KCl 10 mEq/L 50 mL/hr at 10/10/16 1844     LOS: 14 days    Time spent: 30 minutes  Faye Ramsay, MD [facility] Pager [phone]  If 7PM-7AM, please contact night-coverage [email or web address] Password [facility] 10/11/2016, 12:35 PM

## 2016-10-11 NOTE — Progress Notes (Signed)
[place] for Coumadin Indication: unspecified tachycardia  No Known Allergies  Patient Measurements: Height: 5\' 1"  (154.9 cm) Weight: 122 lb 9.2 oz (55.6 kg) IBW/kg (Calculated) : 47.8  Vital Signs: Temp: 97.6 F (36.4 C) (08/29 1305) Temp Source: Oral (08/29 1305) BP: 98/60 (08/29 1305) Pulse Rate: 74 (08/29 1305)  Labs:  Recent Labs  10/09/16 0423 10/10/16 0401 10/11/16 0357  HGB 12.0 12.3 11.9*  HCT 37.6 38.3 36.3  PLT 403* 391 360  LABPROT 18.8*  --   --   INR 1.56  --   --   CREATININE 0.49 0.44 0.52    Estimated Creatinine Clearance: 44.4 mL/min (by C-G formula based on SCr of 0.52 mg/dL).   Medical History: Past Medical History:  Diagnosis Date  . Hyperlipidemia   . Pneumonia   . Tachyarrhythmia     Medications:  Scheduled:  . acidophilus  1 capsule Oral Daily  . aspirin  81 mg Oral Daily  . diltiazem  180 mg Oral Daily  . famotidine  20 mg Oral BID  . guaiFENesin  1,200 mg Oral BID  . levalbuterol  1.25 mg Nebulization TID  . mouth rinse  15 mL Mouth Rinse BID  . nystatin   Topical TID  . nystatin cream   Topical BID  . polyethylene glycol  17 g Oral Daily  . predniSONE  20 mg Oral Q breakfast  . senna  2 tablet Oral QHS  . warfarin  5 mg Oral ONCE-1800  . Warfarin - Pharmacist Dosing Inpatient   Does not apply q1800   Infusions:  . sodium chloride 10 mL/hr at 10/11/16 0900  . dextrose 5 % and 0.45 % NaCl with KCl 10 mEq/L 50 mL/hr at 10/10/16 1844   PRN: chlorpheniramine-HYDROcodone, levalbuterol, metoprolol tartrate, ondansetron (ZOFRAN) IV    Assessment: 76 yo female admitted with pneumonia. Her daughter reports that she is taking warfarin 5mg  daily for an abnormal heart rhythm but has not taken it in the past week. She was unable to say what physician monitors the INR.  INR is subtherapeutic at 0.92. Pharmacy is consulted to resume dosing now.  8/29 Warfarin can now be resumed after undergoing  bronchoscopy.   10/11/2016:  INR subtherapeutic at 1.56. LD was on 8/25  CBC: Hgb at 11.9, Pltc WNL  No bleeding reported  Regular diet ordered  Drug-drug interactions- no major DDI.    Goal of Therapy:  INR 2-3 Monitor platelets by anticoagulation protocol: Yes  Plan:   Warfarin 5mg  PO x 1 today  Daily PT/INR  Consider stopping aspirin with concurrent warfarin therapy    Royetta Asal, PharmD, BCPS Pager [phone] 10/11/2016 1:53 PM

## 2016-10-11 NOTE — Progress Notes (Signed)
PULMONARY / CRITICAL CARE MEDICINE   Name: Michelle Mccall MRN: [id number] DOB: 07-28-1940    ADMISSION DATE:  09/27/2016 CONSULTATION DATE: 09/28/2016  REFERRING MD:  Neil Crouch MD  CHIEF COMPLAINT:  Sepsis, pneumonia  HISTORY OF PRESENT ILLNESS:   76 yowf Russian/ never smoker  with recurrent episodes of pneumonia vs persistent AS dz LLL dating back to at least 05/29/16 raising possibility of BAC.  She is an immigrant from [place]. 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 She had a spine surgery in the 1960s and had some kind of lung issue after that. She is a never smoker. 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 She denies any exposure to TB in the past.  SUBJECTIVE:  No issues overnight  VITAL SIGNS: BP 110/65   Pulse 78   Temp 98.6 F (37 C) (Oral)   Resp (!) 36   Ht 5\' 1"  (1.549 m)   Wt 122 lb 9.2 oz (55.6 kg)   SpO2 97%   BMI 23.16 kg/m   INTAKE / OUTPUT: I/O last 3 completed shifts: In: 963.3 [P.O.:600; I.V.:363.3] Out: 3 [Urine:3]  PHYSICAL EXAMINATION:  Wt Readings from Last 3 Encounters:  10/11/16 122 lb 9.2 oz (55.6 kg)     Gen:      No acute distress HEENT:  EOMI, sclera anicteric Neck:     No masses; no thyromegaly Lungs:    basal crackles L > R; normal respiratory effort CV:         Regular rate and rhythm; no murmurs Abd:      + bowel sounds; soft, non-tender; no palpable masses, no distension Ext:    No edema; adequate peripheral perfusion Skin:      Warm and dry; no rash Neuro: alert and oriented x 3 Psych: normal mood and affect  LABS:  BMET  Recent Labs Lab 10/09/16 0423 10/10/16 0401 10/11/16 0357  NA 138 136 134*  K 3.8 3.8 3.9  CL 98* 99* 99*  CO2 33* 30 28  BUN 23* 19 17  CREATININE 0.49 0.44 0.52  GLUCOSE 104* 116* 160*    Electrolytes  Recent Labs Lab 10/09/16 0423 10/10/16 0401  10/11/16 0357  CALCIUM 9.3 8.8* 9.1    CBC  Recent Labs Lab 10/09/16 0423 10/10/16 0401 10/11/16 0357  WBC 10.5 10.1 12.6*  HGB 12.0 12.3 11.9*  HCT 37.6 38.3 36.3  PLT 403* 391 360    Coag's  Recent Labs Lab 10/07/16 0359 10/08/16 0424 10/09/16 0423  INR 1.84 1.92 1.56    Sepsis Markers No results for input(s): LATICACIDVEN, PROCALCITON, O2SATVEN in the last 168 hours.  ABG No results for input(s): PHART, PCO2ART, PO2ART in the last 168 hours.  Liver Enzymes No results for input(s): AST, ALT, ALKPHOS, BILITOT, ALBUMIN in the last 168 hours.  Cardiac Enzymes No results for input(s): TROPONINI, PROBNP in the last 168 hours.  Glucose No results for input(s): GLUCAP in the last 168 hours.  Imaging Dg Chest Port 1 View  Result Date: 10/11/2016 CLINICAL DATA:  Left lower lobe lung biopsy. EXAM: PORTABLE CHEST 1 VIEW COMPARISON:  10/09/2016. FINDINGS: Cardiomegaly. Diffuse left lung infiltrate. Calcified pulmonary nodule right mid lung consistent with granuloma . Left-sided pleural effusion. Small right pleural effusion.No pneumothorax. Cardiomegaly. No pulmonary venous congestion . No acute bony abnormality. IMPRESSION: 1. Diffuse left lung field infiltrate and left-sided pleural  effusion again noted. 2. Mild right base infiltrate cannot be excluded. Small right pleural effusion. 3. Cardiomegaly.  No pulmonary venous congestion. Electronically Signed   By: Marcello Moores  Register   On: 10/11/2016 10:04   Dg C-arm Bronchoscopy  Result Date: 10/11/2016 C-ARM BRONCHOSCOPY: Fluoroscopy was utilized by the requesting physician.  No radiographic interpretation.    STUDIES:  CT chest 8/15 >> extensive consolidation on Lt, 12 mm RUL nodule Echo 8/16 >> EF 60 to 65%, grade 1 DD, severe RV dilation, mod TR, PAS 58 mmHg Serology 8/19 >> ANA, RF, anti CCP, ANCA all negative Dg Es   10/04/16 > no asp  Sputum cytology 8/25 >>>   CULTURES/micro: Strep urinary antigen 5/16 >  neg Strep legionella antigen 8/16 > neg  PCT    8/20  >  Neg  Blood 8/15 >> final neg x2 Sputum 8/16 >> no wbc/ no org seen >  oral flora Sputum for afb >> pending  ANTIBIOTICS: Vanco 8/16 > 8/18 Cefepime 8/16 > 8/24   SIGNIFICANT EVENTS: 8/16 admit with multilobar PNA  ASSESSMENT / PLAN: 76 year old with recurrent pneumonia. Admitted with acute hypoxic respiratory failure, multilobar infiltrates mostly on the left. Workup for interstitial lung disease is negative. Sputum cytology and AFB smear are negative. This may be chronic recurrent resp infection from poor clearance of secretions, ongoing aspiration but there is concern of adenoca of lung  Stable for bronch today. Will proceed. Consent obtained.  Marshell Garfinkel MD [facility] Pager [phone] If no answer or after 3pm call: [phone] 10/11/2016, 10:11 AM

## 2016-10-12 LAB — BASIC METABOLIC PANEL
Anion gap: 7 (ref 5–15)
BUN: 12 mg/dL (ref 6–20)
CHLORIDE: 101 mmol/L (ref 101–111)
CO2: 28 mmol/L (ref 22–32)
CREATININE: 0.45 mg/dL (ref 0.44–1.00)
Calcium: 9 mg/dL (ref 8.9–10.3)
Glucose, Bld: 188 mg/dL — ABNORMAL HIGH (ref 65–99)
POTASSIUM: 4.2 mmol/L (ref 3.5–5.1)
SODIUM: 136 mmol/L (ref 135–145)

## 2016-10-12 LAB — CBC
HCT: 36 % (ref 36.0–46.0)
Hemoglobin: 11.4 g/dL — ABNORMAL LOW (ref 12.0–15.0)
MCH: 27.1 pg (ref 26.0–34.0)
MCHC: 31.7 g/dL (ref 30.0–36.0)
MCV: 85.7 fL (ref 78.0–100.0)
PLATELETS: 353 10*3/uL (ref 150–400)
RBC: 4.2 MIL/uL (ref 3.87–5.11)
RDW: 15.1 % (ref 11.5–15.5)
WBC: 12.1 10*3/uL — AB (ref 4.0–10.5)

## 2016-10-12 LAB — PROTIME-INR
INR: 0.93
Prothrombin Time: 12.4 seconds (ref 11.4–15.2)

## 2016-10-12 LAB — ACID FAST SMEAR (AFB, MYCOBACTERIA): Acid Fast Smear: NEGATIVE

## 2016-10-12 LAB — ACID FAST SMEAR (AFB)

## 2016-10-12 MED ORDER — WARFARIN SODIUM 5 MG PO TABS
5.0000 mg | ORAL_TABLET | Freq: Once | ORAL | Status: AC
  Administered 2016-10-12: 5 mg via ORAL
  Filled 2016-10-12: qty 1

## 2016-10-12 NOTE — Progress Notes (Signed)
PROGRESS NOTE  Michelle Mccall  LSL:[id number] DOB: 19-Jul-1940 DOA: 09/27/2016   PCP: No primary care provider on file.  Brief Narrative:  76 year old [place] female with history of dyslipidemia, unspecified tachyarrhythmia, multiple episodes of pneumonia in past 9 months, November and December 2017, April 2018. Patient transferred from [facility] with progressive shortness of breath at rest and on exertion. No fevers or chills.  She was treated with steroids, antibiotics, and diuretics with no improvement.  No obvious aspiration noted by SLP. Sputum cytology nondiagnostic.   Assessment & Plan:  Persistent acute respiratory failure with hypoxia with near white-out of the left lung - still with productive cough and exertional dyspnea, completed 10 days of IV ABX, course of steroids, diuresis - AFB smear negative, cultures pending  - broncho done 8/29 - normal airway anatomy, BAL performed, bx taken, await for final results   Hemoptysis - more blood in sputum this AM - awaiting from bronch results - called daughter to discuss d/c plan - CBC in AM - monitor hemoptysis   Hypokalemia / Hyperkalemia  - K is stable   Hypotension - likely due to combination of lasix and resuming diltiazem, resolved. - Cardizem resumed but lasix still on hold until BP more stable   Episodic SVT with known history of tachyarrhythmia despite starting diltiazem, asymptomatic - Continue diltiazem - resume Coumadin   Normocytic anemia - monitor Hg as pt with more hemoptysis   Constipation - keep on bowel regimen, pt did have BM yesterday   DVT prophylaxis:  lovenox Code Status:   Full code Family Communication:  Pt with ipad interpreter, daughter at bedside  Disposition Plan:  Home in AM if hemoptysis better   Consultants:   PCCM  Procedures:   Bronch 8/29   Antimicrobials:  Anti-infectives    Start     Dose/Rate Route Frequency Ordered Stop   10/01/16 1200  ceFEPIme (MAXIPIME) 1 g in dextrose  5 % 50 mL IVPB  Status:  Discontinued     1 g 100 mL/hr over 30 Minutes Intravenous Every 12 hours 10/01/16 1028 10/06/16 1754   09/28/16 2100  vancomycin (VANCOCIN) IVPB 750 mg/150 ml premix  Status:  Discontinued     750 mg 150 mL/hr over 60 Minutes Intravenous Every 24 hours 09/27/16 2102 09/30/16 0647   09/28/16 2100  ceFEPIme (MAXIPIME) 1 g in dextrose 5 % 50 mL IVPB  Status:  Discontinued     1 g 100 mL/hr over 30 Minutes Intravenous Every 24 hours 09/27/16 2104 10/01/16 1028   09/27/16 2200  ceFEPIme (MAXIPIME) 1 g in dextrose 5 % 50 mL IVPB  Status:  Discontinued     1 g 100 mL/hr over 30 Minutes Intravenous Every 8 hours 09/27/16 2050 09/27/16 2104   09/27/16 2100  vancomycin (VANCOCIN) IVPB 1000 mg/200 mL premix     1,000 mg 200 mL/hr over 60 Minutes Intravenous  Once 09/27/16 2050 09/27/16 2245   09/27/16 2057  ceFEPIme (MAXIPIME) 1 g injection    Comments:  Woodward Ku   : cabinet override      09/27/16 2057 09/28/16 0859     Subjective: More hemoptysis this AM.   Objective: Vitals:   10/11/16 2206 10/12/16 0521 10/12/16 0804 10/12/16 1045  BP: (!) 100/55 101/64  104/68  Pulse: 80 85  76  Resp: 18 18    Temp: 97.7 F (36.5 C) 97.6 F (36.4 C)  98.6 F (37 C)  TempSrc: Oral Oral  Oral  SpO2: 94% 94%  98% 99%  Weight:      Height:        Intake/Output Summary (Last 24 hours) at 10/12/16 1319 Last data filed at 10/12/16 1015  Gross per 24 hour  Intake           876.67 ml  Output             1900 ml  Net         -1023.33 ml   Filed Weights   10/09/16 0816 10/10/16 0519 10/11/16 0601  Weight: 52.8 kg (116 lb 6.4 oz) 52.8 kg (116 lb 6.5 oz) 55.6 kg (122 lb 9.2 oz)    Physical Exam  Constitutional: Appears calm unless starts to cough  CVS: RRR, S1/S2 +, no murmurs, no gallops, no carotid bruit.  Pulmonary: Course breath sounds on the left, noted blood in sputum this AM Abdominal: Soft. BS +,  no distension, tenderness, rebound or guarding.  Neuro: Alert.  Normal reflexes, muscle tone coordination. No cranial nerve deficit. Psychiatric: Normal mood and affect. Behavior, judgment, thought content normal.   Data Reviewed: I have personally reviewed following labs and imaging studies  CBC:  Recent Labs Lab 10/06/16 0407 10/09/16 0423 10/10/16 0401 10/11/16 0357 10/12/16 0349  WBC 10.6* 10.5 10.1 12.6* 12.1*  HGB 12.2 12.0 12.3 11.9* 11.4*  HCT 37.8 37.6 38.3 36.3 36.0  MCV 85.7 83.6 84.2 84.8 85.7  PLT 353 403* 391 360 767   Basic Metabolic Panel:  Recent Labs Lab 10/06/16 0407 10/09/16 0423 10/10/16 0401 10/11/16 0357 10/12/16 0349  NA 138 138 136 134* 136  K 4.5 3.8 3.8 3.9 4.2  CL 100* 98* 99* 99* 101  CO2 33* 33* _0 GLUCOSE 88 104* 116* 160* 188*  BUN 16 23* _1 CREATININE 0.45 0.49 0.44 0.52 0.45  CALCIUM 8.8* 9.3 8.8* 9.1 9.0   Coagulation Profile:  Recent Labs Lab 10/06/16 0407 10/07/16 0359 10/08/16 0424 10/09/16 0423 10/12/16 0349  INR 1.35 1.84 1.92 1.56 0.93   Recent Results (from the past 240 hour(s))  Fungus Culture With Stain     Status: None (Preliminary result)   Collection Time: 10/06/16  5:41 PM  Result Value Ref Range Status   Fungus Stain Final report  Final    Comment: (NOTE) Performed At: [facility] [place], [place] [id number] Lindon Romp MD [id number]    Fungus (Mycology) Culture PENDING  Incomplete   Fungal Source EXPECTORATED SPUTUM  Final  Acid Fast Smear (AFB)     Status: None   Collection Time: 10/06/16  5:41 PM  Result Value Ref Range Status   AFB Specimen Processing Concentration  Final   Acid Fast Smear Negative  Final    Comment: (NOTE) Performed At: [facility] [place], [place] [id number] Lindon Romp MD IW:[id number]    Source (AFB) EXPECTORATED SPUTUM  Final  Fungus Culture Result     Status: None   Collection Time: 10/06/16  5:41 PM  Result Value Ref Range Status   Result 1 Comment  Final      Comment: (NOTE) Fungal elements, such as arthroconidia, hyphal fragments, chlamydoconidia, observed. Performed At: [facility] [place], [place] [id number] Lindon Romp MD [id number]   Culture, expectorated sputum-assessment     Status: None   Collection Time: 10/07/16 10:25 AM  Result Value Ref Range Status   Specimen Description SPUTUM  Final   Special Requests Normal  Final   Sputum evaluation   Final    Sputum specimen not acceptable for testing.  Please recollect.   NOTIFIED PAM RN [phone] [email or web address]    Report Status 10/09/2016 FINAL  Final  Culture, bal-quantitative     Status: None (Preliminary result)   Collection Time: 10/11/16  9:36 AM  Result Value Ref Range Status   Specimen Description BRONCHIAL ALVEOLAR LAVAGE  Final   Special Requests Normal  Final   Gram Stain   Final    FEW WBC PRESENT,BOTH PMN AND MONONUCLEAR RARE SQUAMOUS EPITHELIAL CELLS PRESENT RARE GRAM POSITIVE RODS    Culture   Final    CULTURE REINCUBATED FOR BETTER GROWTH Performed at [facility], [place] [address], [place], [place] [address]    Report Status PENDING  Incomplete    Radiology Studies: Dg Chest Port 1 View  Result Date: 10/11/2016 CLINICAL DATA:  Left lower lobe lung biopsy. EXAM: PORTABLE CHEST 1 VIEW COMPARISON:  10/09/2016. FINDINGS: Cardiomegaly. Diffuse left lung infiltrate. Calcified pulmonary nodule right mid lung consistent with granuloma . Left-sided pleural effusion. Small right pleural effusion.No pneumothorax. Cardiomegaly. No pulmonary venous congestion . No acute bony abnormality. IMPRESSION: 1. Diffuse left lung field infiltrate and left-sided pleural effusion again noted. 2. Mild right base infiltrate cannot be excluded. Small right pleural effusion. 3. Cardiomegaly.  No pulmonary venous congestion. Electronically Signed   By: Marcello Moores  Register   On: 10/11/2016 10:04   Dg C-arm Bronchoscopy  Result Date: 10/11/2016 C-ARM  BRONCHOSCOPY: Fluoroscopy was utilized by the requesting physician.  No radiographic interpretation.     Scheduled Meds: . acidophilus  1 capsule Oral Daily  . aspirin  81 mg Oral Daily  . diltiazem  180 mg Oral Daily  . docusate sodium  100 mg Oral Daily  . famotidine  20 mg Oral BID  . guaiFENesin  1,200 mg Oral BID  . levalbuterol  1.25 mg Nebulization TID  . mouth rinse  15 mL Mouth Rinse BID  . nystatin   Topical TID  . nystatin cream   Topical BID  . polyethylene glycol  17 g Oral Daily  . predniSONE  20 mg Oral Q breakfast  . senna  2 tablet Oral QHS  . warfarin  5 mg Oral ONCE-1800  . Warfarin - Pharmacist Dosing Inpatient   Does not apply q1800   Continuous Infusions: . sodium chloride 10 mL/hr at 10/11/16 0900  . dextrose 5 % and 0.45 % NaCl with KCl 10 mEq/L 50 mL/hr at 10/12/16 1242     LOS: 15 days    Time spent: 25 minutes  Faye Ramsay, MD [facility] Pager [phone]  If 7PM-7AM, please contact night-coverage [email or web address] Password [email or web address] 10/12/2016, 1:19 PM

## 2016-10-12 NOTE — Progress Notes (Addendum)
Patient down from 5L of oxygen via [place] to 2 L of oxygen via [place] at rest and while ambulating.  See Vital Signs flowsheet and PT note for further detail.

## 2016-10-12 NOTE — Progress Notes (Signed)
[place] for Coumadin Indication: unspecified tachycardia  No Known Allergies  Patient Measurements: Height: 5\' 1"  (154.9 cm) Weight: 122 lb 9.2 oz (55.6 kg) IBW/kg (Calculated) : 47.8  Vital Signs: Temp: 97.6 F (36.4 C) (08/30 0521) Temp Source: Oral (08/30 0521) BP: 101/64 (08/30 0521) Pulse Rate: 85 (08/30 0521)  Labs:  Recent Labs  10/10/16 0401 10/11/16 0357 10/12/16 0349  HGB 12.3 11.9* 11.4*  HCT 38.3 36.3 36.0  PLT 391 360 353  LABPROT  --   --  12.4  INR  --   --  0.93  CREATININE 0.44 0.52 0.45    Estimated Creatinine Clearance: 44.4 mL/min (by C-G formula based on SCr of 0.45 mg/dL).   Medical History: Past Medical History:  Diagnosis Date  . Hyperlipidemia   . Pneumonia   . Tachyarrhythmia     Medications:  Scheduled:  . acidophilus  1 capsule Oral Daily  . aspirin  81 mg Oral Daily  . diltiazem  180 mg Oral Daily  . docusate sodium  100 mg Oral Daily  . famotidine  20 mg Oral BID  . guaiFENesin  1,200 mg Oral BID  . levalbuterol  1.25 mg Nebulization TID  . mouth rinse  15 mL Mouth Rinse BID  . nystatin   Topical TID  . nystatin cream   Topical BID  . polyethylene glycol  17 g Oral Daily  . predniSONE  20 mg Oral Q breakfast  . senna  2 tablet Oral QHS  . Warfarin - Pharmacist Dosing Inpatient   Does not apply q1800   Infusions:  . sodium chloride 10 mL/hr at 10/11/16 0900  . dextrose 5 % and 0.45 % NaCl with KCl 10 mEq/L 50 mL/hr at 10/10/16 1844   PRN: chlorpheniramine-HYDROcodone, levalbuterol, metoprolol tartrate, ondansetron (ZOFRAN) IV    Assessment: 76 yo female admitted with pneumonia. Her daughter reports that she is taking warfarin 5mg  daily for an abnormal heart rhythm but has not taken it in the past week. She was unable to say what physician monitors the INR.  INR is subtherapeutic at 0.92. Pharmacy is consulted to resume dosing now.  8/29 Warfarin can now be resumed after  undergoing bronchoscopy.   10/12/2016:  INR subtherapeutic at 0.93 after LD on 8/30 at 1800, labs dran at 0400  CBC: Hgb at 11.4, Pltc WNL  No bleeding reported  Regular diet ordered  Drug-drug interactions- no major DDI.    Goal of Therapy:  INR 2-3 Monitor platelets by anticoagulation protocol: Yes  Plan:   Warfarin 5mg  PO x 1 today  Daily PT/INR  Consider stopping aspirin with concurrent warfarin therapy    Royetta Asal, PharmD, BCPS Pager [phone] 10/12/2016 11:09 AM

## 2016-10-12 NOTE — Progress Notes (Signed)
PULMONARY / CRITICAL CARE MEDICINE   Name: Michelle Mccall MRN: [id number] DOB: Feb 09, 1941    ADMISSION DATE:  09/27/2016 CONSULTATION DATE: 09/28/2016  REFERRING MD:  Neil Crouch MD  CHIEF COMPLAINT:  Sepsis, pneumonia  HISTORY OF PRESENT ILLNESS:   76 yowf Russian/ never smoker  with recurrent episodes of pneumonia vs persistent AS dz LLL dating back to at least 05/29/16 raising possibility of BAC.  She is an immigrant from [place]. 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 She had a spine surgery in the 1960s and had some kind of lung issue after that. She is a never smoker. 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 She denies any exposure to TB in the past.  SUBJECTIVE:  No issues. Tolerated bronch Still has cough with sputum.  VITAL SIGNS: BP 101/64 (BP Location: Left Arm)   Pulse 85   Temp 97.6 F (36.4 C) (Oral)   Resp 18   Ht 5\' 1"  (1.549 m)   Wt 122 lb 9.2 oz (55.6 kg)   SpO2 98%   BMI 23.16 kg/m   INTAKE / OUTPUT: I/O last 3 completed shifts: In: 2025 [I.V.:1415] Out: 1703 [KYHCW:2376]  PHYSICAL EXAMINATION:  Wt Readings from Last 3 Encounters:  10/11/16 122 lb 9.2 oz (55.6 kg)     Blood pressure 101/64, pulse 85, temperature 97.6 F (36.4 C), temperature source Oral, resp. rate 18, height 5\' 1"  (1.549 m), weight 122 lb 9.2 oz (55.6 kg), SpO2 98 %. Gen:      No acute distress HEENT:  EOMI, sclera anicteric Neck:     No masses; no thyromegaly Lungs:    Crackles L > R; normal respiratory effort CV:         Regular rate and rhythm; no murmurs Abd:      + bowel sounds; soft, non-tender; no palpable masses, no distension Ext:    No edema; adequate peripheral perfusion Skin:      Warm and dry; no rash Neuro: alert and oriented x 3 Psych: normal mood and affect  LABS:  BMET  Recent Labs Lab 10/10/16 0401 10/11/16 0357  10/12/16 0349  NA 136 134* 136  K 3.8 3.9 4.2  CL 99* 99* 101  CO2 30 28 28   BUN 19 17 12   CREATININE 0.44 0.52 0.45  GLUCOSE 116* 160* 188*    Electrolytes  Recent Labs Lab 10/10/16 0401 10/11/16 0357 10/12/16 0349  CALCIUM 8.8* 9.1 9.0    CBC  Recent Labs Lab 10/10/16 0401 10/11/16 0357 10/12/16 0349  WBC 10.1 12.6* 12.1*  HGB 12.3 11.9* 11.4*  HCT 38.3 36.3 36.0  PLT 391 360 353    Coag's  Recent Labs Lab 10/08/16 0424 10/09/16 0423 10/12/16 0349  INR 1.92 1.56 0.93    Sepsis Markers No results for input(s): LATICACIDVEN, PROCALCITON, O2SATVEN in the last 168 hours.  ABG No results for input(s): PHART, PCO2ART, PO2ART in the last 168 hours.  Liver Enzymes No results for input(s): AST, ALT, ALKPHOS, BILITOT, ALBUMIN in the last 168 hours.  Cardiac Enzymes No results for input(s): TROPONINI, PROBNP in the last 168 hours.  Glucose No results for input(s): GLUCAP in the last 168 hours.  Imaging Dg Chest Port 1 View  Result Date: 10/11/2016 CLINICAL DATA:  Left lower lobe lung biopsy. EXAM: PORTABLE CHEST 1 VIEW COMPARISON:  10/09/2016. FINDINGS: Cardiomegaly. Diffuse left lung infiltrate.  Calcified pulmonary nodule right mid lung consistent with granuloma . Left-sided pleural effusion. Small right pleural effusion.No pneumothorax. Cardiomegaly. No pulmonary venous congestion . No acute bony abnormality. IMPRESSION: 1. Diffuse left lung field infiltrate and left-sided pleural effusion again noted. 2. Mild right base infiltrate cannot be excluded. Small right pleural effusion. 3. Cardiomegaly.  No pulmonary venous congestion. Electronically Signed   By: Marcello Moores  Register   On: 10/11/2016 10:04   Dg C-arm Bronchoscopy  Result Date: 10/11/2016 C-ARM BRONCHOSCOPY: Fluoroscopy was utilized by the requesting physician.  No radiographic interpretation.    STUDIES:  CT chest 8/15 >> extensive consolidation on Lt, 12 mm RUL nodule Echo 8/16 >> EF 60 to 65%,  grade 1 DD, severe RV dilation, mod TR, PAS 58 mmHg Serology 8/19 >> ANA, RF, anti CCP, ANCA all negative Dg Es   10/04/16 > no asp  Sputum cytology 8/25 >>> neg  CULTURES/micro: Strep urinary antigen 5/16 > neg Strep legionella antigen 8/16 > neg  PCT    8/20  >  Neg  Blood 8/15 >> final neg x2 Sputum 8/16 >> no wbc/ no org seen >  oral flora Sputum for afb >> pending  ANTIBIOTICS: Vanco 8/16 > 8/18 Cefepime 8/16 > 8/24   SIGNIFICANT EVENTS: 8/16 admit with multilobar PNA  ASSESSMENT / PLAN: 76 year old with recurrent pneumonia. Admitted with acute hypoxic respiratory failure, multilobar infiltrates mostly on the left. Workup for interstitial lung disease is negative. Sputum cytology and AFB smear are negative. This may be chronic recurrent resp infection from poor clearance of secretions, ongoing aspiration but there is concern of adenoca of lung  Underwent bronch yesterday and results are pending. Will follow up when path is back.   Marshell Garfinkel MD [facility] Pager [phone] If no answer or after 3pm call: [phone] 10/12/2016, 9:37 AM

## 2016-10-12 NOTE — Progress Notes (Signed)
Physical Therapy Treatment Patient Details Name: Mariely Mahr MRN: [id number] DOB: 01-Jun-1940 Today's Date: 10/12/2016    History of Present Illness Pt admitted with dx sepsis 2* recurrent PNA    PT Comments    Assisted with amb a greater distance in hallway while monitoring sats.     SATURATION QUALIFICATIONS: (This note is used to comply with regulatory documentation for home oxygen)  Patient Saturations on Room Air at Rest = 89%  Patient Saturations on Room Air while Ambulating 375 feet = 80%  Patient Saturations on 2 Liters of oxygen while Ambulating = 90%  Please briefly explain why patient needs home oxygen:  Pt required supplemental oxygen to achieve therqpeutic level   Follow Up Recommendations  No PT follow up     Equipment Recommendations  None recommended by PT    Recommendations for Other Services       Precautions / Restrictions Precautions Precautions: Fall Precaution Comments: monitor sats/likes to wear shoes/speaks [place] Restrictions Weight Bearing Restrictions: No    Mobility  Bed Mobility Overal bed mobility: Modified Independent             General bed mobility comments: pt sitting EOB Indep on arrival  Transfers Overall transfer level: Needs assistance Equipment used: None Transfers: Sit to/from [facility] Sit to Stand: Supervision Stand pivot transfers: Supervision       General transfer comment: Supervision for environmental safety and O2 tubing.  Good use of hands to steady self  Ambulation/Gait Ambulation/Gait assistance: Supervision Ambulation Distance (Feet): 375 Feet Assistive device: None Gait Pattern/deviations: Step-through pattern;Decreased stride length;Trunk flexed Gait velocity: WFL   General Gait Details: amb without AD.  Good alternating gait and functional spped.  Amb on RA decreased to 80%.  Reapplied 2 lts to achieve 90% and visually instructed on purse lip breathing/encouraged  coughing.     Stairs            Wheelchair Mobility    Modified Rankin (Stroke Patients Only)       Balance                                            Cognition Arousal/Alertness: Awake/alert Behavior During Therapy: WFL for tasks assessed/performed Overall Cognitive Status: Within Functional Limits for tasks assessed                                 General Comments: speaks some English      Exercises      General Comments        Pertinent Vitals/Pain Pain Assessment: No/denies pain    Home Living                      Prior Function            PT Goals (current goals can now be found in the care plan section) Progress towards PT goals: Progressing toward goals    Frequency    Min 3X/week      PT Plan Current plan remains appropriate    Co-evaluation              AM-PAC PT "6 Clicks" Daily Activity  Outcome Measure  Difficulty turning over in bed (including adjusting bedclothes, sheets and blankets)?: None Difficulty moving from lying on back to sitting on  the side of the bed? : A Little Difficulty sitting down on and standing up from a chair with arms (e.g., wheelchair, bedside commode, etc,.)?: A Little Help needed moving to and from a bed to chair (including a wheelchair)?: A Little Help needed walking in hospital room?: A Little Help needed climbing 3-5 steps with a railing? : A Little 6 Click Score: 19    End of Session Equipment Utilized During Treatment: Gait belt;Oxygen Activity Tolerance: Patient tolerated treatment well Patient left: with call bell/phone within reach;with chair alarm set;in bed Nurse Communication: Mobility status PT Visit Diagnosis: Unsteadiness on feet (R26.81);Muscle weakness (generalized) (M62.81)     Time: 1660-6004 PT Time Calculation (min) (ACUTE ONLY): 30 min  Charges:  $Gait Training: 8-22 mins $Therapeutic Activity: 8-22 mins                    G  Codes:       {Phinneas Shakoor  PTA [facility]  Acute  Rehab Pager      [phone]

## 2016-10-13 ENCOUNTER — Encounter (HOSPITAL_COMMUNITY): Payer: Self-pay | Admitting: Pulmonary Disease

## 2016-10-13 LAB — CBC
HEMATOCRIT: 36.8 % (ref 36.0–46.0)
Hemoglobin: 11.6 g/dL — ABNORMAL LOW (ref 12.0–15.0)
MCH: 27.4 pg (ref 26.0–34.0)
MCHC: 31.5 g/dL (ref 30.0–36.0)
MCV: 86.8 fL (ref 78.0–100.0)
Platelets: 338 10*3/uL (ref 150–400)
RBC: 4.24 MIL/uL (ref 3.87–5.11)
RDW: 15.4 % (ref 11.5–15.5)
WBC: 11.2 10*3/uL — AB (ref 4.0–10.5)

## 2016-10-13 LAB — BASIC METABOLIC PANEL
Anion gap: 6 (ref 5–15)
BUN: 16 mg/dL (ref 6–20)
CHLORIDE: 102 mmol/L (ref 101–111)
CO2: 30 mmol/L (ref 22–32)
CREATININE: 0.44 mg/dL (ref 0.44–1.00)
Calcium: 9.1 mg/dL (ref 8.9–10.3)
GFR calc non Af Amer: >60 mL/min (ref >60)
Glucose, Bld: 95 mg/dL (ref 65–99)
POTASSIUM: 5 mmol/L (ref 3.5–5.1)
SODIUM: 138 mmol/L (ref 135–145)

## 2016-10-13 LAB — CULTURE, BAL-QUANTITATIVE W GRAM STAIN: Special Requests: NORMAL

## 2016-10-13 LAB — PROTIME-INR
INR: 1.23
Prothrombin Time: 15.4 seconds — ABNORMAL HIGH (ref 11.4–15.2)

## 2016-10-13 LAB — CULTURE, BAL-QUANTITATIVE: CULTURE: NORMAL — AB

## 2016-10-13 MED ORDER — LEVALBUTEROL HCL 1.25 MG/0.5ML IN NEBU: 1.2500 mg | INHALATION_SOLUTION | Freq: Four times a day (QID) | RESPIRATORY_TRACT | 12 refills | Status: AC | PRN

## 2016-10-13 MED ORDER — WARFARIN SODIUM 5 MG PO TABS
5.0000 mg | ORAL_TABLET | Freq: Once | ORAL | Status: AC
  Administered 2016-10-13: 5 mg via ORAL
  Filled 2016-10-13: qty 1

## 2016-10-13 MED ORDER — LEVALBUTEROL HCL 1.25 MG/0.5ML IN NEBU: 1.2500 mg | INHALATION_SOLUTION | Freq: Three times a day (TID) | RESPIRATORY_TRACT | 12 refills | Status: DC

## 2016-10-13 NOTE — Discharge Summary (Signed)
Physician Discharge Summary  Michelle Mccall NAT:[id number] DOB: 07/13/1940 DOA: 09/27/2016  PCP: No primary care provider on file.  Admit date: 09/27/2016 Discharge date: 10/13/2016  Recommendations for Outpatient Follow-up:  1. Pt will need to follow up with PCP in 1-2 weeks post discharge 2. Please obtain BMP to evaluate electrolytes and kidney function 3. Please also check CBC to evaluate Hg and Hct levels 4. Pt will be discharged on oxygen via [place] 3 L 5. New diagnosis of lung adenocarcinoma, awaiting for family to call us so we can discuss further disposition, daughter asked not to tell pt anything until we speak with her first. We are still awaiting for family to come or call us 6. If family desires, oncology referral will be provided   Discharge Diagnoses:  Principal Problem:   Sepsis ([place]) Active Problems:   HCAP (healthcare-associated pneumonia)   Hypokalemia   Anemia   Hypoalbuminemia   Tachyarrhythmia   Hyperlipidemia   Acute respiratory failure with hypoxia and hypercapnia (HCC)   Abnormal EKG   Pulmonary infiltrates  Discharge Condition: Stable  Diet recommendation: Heart healthy diet discussed in details   History of present illness:  Brief Narrative:  76 year old [place] female with history of dyslipidemia, unspecified tachyarrhythmia, multiple episodes of pneumonia in past 9 months, November and December 2017, April 2018. Patient transferred from [facility] with progressive shortness of breath at rest and on exertion. No fevers or chills.  She was treated with steroids, antibiotics, and diuretics with no improvement.  No obvious aspiration noted by SLP. Sputum cytology nondiagnostic.   Assessment & Plan:  Persistent acute respiratory failure with hypoxia with near white-out of the left lung - still with productive cough and exertional dyspnea, completed 10 days of IV ABX, course of steroids, diuresis - AFB smear negative, cultures pending  - broncho done 8/29 -  normal airway anatomy, BAL performed, bx taken, preliminary report c/w adenocarcinoma - complicated situation as daughter does not want information disclosed until she gets all the information first but daughter has not come to the hospital yet and had not returned any calls to date  - oncology referral can be provided depending on family preference   Hemoptysis - more blood in sputum this AM - due to lung cancer - will need oncology follow up but as noted above, we are awaiting for family to come or call us so we can discuss further plan   Hypokalemia / Hyperkalemia  - stable   Hypotension - likely due to combination of lasix and resuming diltiazem, resolved. - has been on Cardizem   Episodic SVT with known history of tachyarrhythmia despite starting diltiazem, asymptomatic - Continue diltiazem - cont Coumadin   Normocytic anemia - monitor Hg in an outpatient setting   Constipation - keep on bowel regimen  DVT prophylaxis:  lovenox Code Status:   Full code Family Communication:  Pt at bedside  Disposition Plan:  Home when family arrives   Consultants:   PCCM  Procedures:   Bronch 8/29   Procedures/Studies: Dg Chest 2 View  Result Date: 10/09/2016 CLINICAL DATA:  Acute respiratory failure EXAM: CHEST  2 VIEW COMPARISON:  10/06/2016 FINDINGS: Extensive left lower lobe and lingular airspace disease. 9 mm calcified right mid lung pulmonary nodule likely reflecting sequela prior granulomatous disease. Right lung is clear. Hyperinflated lungs likely reflecting underlying COPD. No pneumothorax. Stable cardiomediastinal silhouette. No acute osseous abnormality. IMPRESSION: 1. Extensive left lower lobe and lingular airspace disease most concerning for pneumonia. Electronically Signed  By: Kathreen Devoid   On: 10/09/2016 10:53   Dg Chest 2 View  Result Date: 09/27/2016 CLINICAL DATA:  Productive cough. EXAM: CHEST  2 VIEW COMPARISON:  None. FINDINGS: Normal cardiac  silhouette. There is diffuse airspace disease involving the LEFT lower lobe and lingula. RIGHT lung history of airspace disease. There is a nodule in the RIGHT lung which appears calcified measuring 6 mm. Aggressive osseous lesion IMPRESSION: 1. Extensive LEFT lower lobe and lingular pneumonia. Consider contrast chest CT to exclude central obstructing lesion. 2. Calcified granuloma in the RIGHT lung. Electronically Signed   By: Suzy Bouchard M.D.   On: 09/27/2016 20:40   Ct Chest W Contrast  Result Date: 09/27/2016 CLINICAL DATA:  Pneumonia recurrent cough, shortness of breath EXAM: CT CHEST WITH CONTRAST TECHNIQUE: Multidetector CT imaging of the chest was performed during intravenous contrast administration. CONTRAST:  140m ISOVUE-300 IOPAMIDOL (ISOVUE-300) INJECTION 61% COMPARISON:  09/27/2016 FINDINGS: Cardiovascular: Non aneurysmal aorta. Minimal atherosclerotic calcifications. Normal heart size. No pericardial effusion. Mediastinum/Nodes: The midline trachea. No thyroid mass. Mild mediastinal adenopathy, largest lymph node is seen in the AP window and measures 1 cm. Esophagus within normal limits Lungs/Pleura: Tiny left pleural effusion. Extensive consolidations and ground-glass densities in the left lower and upper lobes with peripheral consolidations. Small amount of ground-glass density and consolidation in the right middle lobe. Subpleural nodularity in the right lower lobe. 12 mm right upper lobe pulmonary nodule. Upper Abdomen: No acute abnormality. Musculoskeletal: No chest wall abnormality. No acute or significant osseous findings. IMPRESSION: 1. Extensive consolidations and ground-glass density throughout the left upper and lower lobes, most consistent with pneumonia. Additional infiltrate is present within the right middle lobe. Mild subpleural nodularity in the right lower lobe is suspicious for additional small foci of infection. Small left pleural effusion. Imaging follow-up to resolution  is recommended 2. Mild mediastinal adenopathy, likely reactive Aortic Atherosclerosis (ICD10-I70.0). Electronically Signed   By: KDonavan FoilM.D.   On: 09/27/2016 22:36   Dg Esophagus  Result Date: 10/04/2016 CLINICAL DATA:  Coughing while drinking liquids. Evaluate for aspiration. EXAM: ESOPHOGRAM/BARIUM SWALLOW TECHNIQUE: Single contrast examination was performed using  thin barium. FLUOROSCOPY TIME:  Fluoroscopy Time:  0.7 minutes Radiation Exposure Index (if provided by the fluoroscopic device): 5.5 mGy. Number of Acquired Spot Images: 0 COMPARISON:  None. FINDINGS: Right anterior oblique and right lateral decubitus views during swallowing demonstrate no evidence of aspiration or significant penetration. IMPRESSION: No evidence of aspiration. Electronically Signed   By: WTitus DubinM.D.   On: 10/04/2016 12:31   Dg Chest Port 1 View  Result Date: 10/11/2016 CLINICAL DATA:  Left lower lobe lung biopsy. EXAM: PORTABLE CHEST 1 VIEW COMPARISON:  10/09/2016. FINDINGS: Cardiomegaly. Diffuse left lung infiltrate. Calcified pulmonary nodule right mid lung consistent with granuloma . Left-sided pleural effusion. Small right pleural effusion.No pneumothorax. Cardiomegaly. No pulmonary venous congestion . No acute bony abnormality. IMPRESSION: 1. Diffuse left lung field infiltrate and left-sided pleural effusion again noted. 2. Mild right base infiltrate cannot be excluded. Small right pleural effusion. 3. Cardiomegaly.  No pulmonary venous congestion. Electronically Signed   By: TMarcello Moores Register   On: 10/11/2016 10:04   Dg Chest Port 1 View  Result Date: 10/06/2016 CLINICAL DATA:  Dyspnea. EXAM: PORTABLE CHEST 1 VIEW COMPARISON:  Single-view of the chest 10/05/2016 and 10/03/2016. PA and lateral chest 09/27/2016. CT chest 09/27/2016. FINDINGS: Extensive airspace disease throughout almost the entire left chest and a left pleural effusion persist without marked change since the  09/27/2016 study. There is a  trace right pleural effusion and mild right basilar airspace disease. Calcified granuloma on the right is noted. IMPRESSION: No change in extensive airspace disease left chest and left effusion compatible with pneumonia. Electronically Signed   By: Inge Rise M.D.   On: 10/06/2016 10:18   Dg Chest Port 1 View  Result Date: 10/05/2016 CLINICAL DATA:  Follow-up shortness of breath and pneumonia EXAM: PORTABLE CHEST 1 VIEW COMPARISON:  10/03/2016 FINDINGS: Cardiac shadow is stable but somewhat obscured due to significant left-sided infiltrate which is also stable from the prior exam. The right lung demonstrates minimal basilar atelectasis. No bony abnormality is seen. IMPRESSION: Overall stable appearing left lung infiltrate. Mild right basilar atelectasis is noted. Electronically Signed   By: Inez Catalina M.D.   On: 10/05/2016 08:19   Dg Chest Port 1 View  Result Date: 10/03/2016 CLINICAL DATA:  Pneumonia . EXAM: PORTABLE CHEST 1 VIEW COMPARISON:  09/30/2016. FINDINGS: Diffuse left lung infiltrate is again noted without significant interim change. Mild right base atelectasis. Calcified granuloma noted right lung base. Left pleural effusion most likely present. No pneumothorax. Cardiac contour is obscured by the diffuse left lung infiltrate. No acute bony abnormality. IMPRESSION: 1. Diffuse left lung infiltrate again noted consistent with pneumonia. Associated left pleural effusion most likely present. No interim change. 2.  Mild right base atelectasis. Electronically Signed   By: Marcello Moores  Register   On: 10/03/2016 07:19   Dg Chest Port 1 View  Result Date: 09/30/2016 CLINICAL DATA:  Congestive productive cough, sob, admitted for sepsis, tachycardia, hx multiple pneumonias since November of last year. EXAM: PORTABLE CHEST 1 VIEW COMPARISON:  Chest CT and chest radiographs, 09/27/2016. FINDINGS: There is consolidation throughout most of the left lung, sparing the left apex. Lung consolidation in the  upper lobe on the left has mildly increased from the prior exams. Right lung is hyperexpanded. Stable granuloma in the inferior right upper lobe. Right lung is otherwise clear. Cardiac silhouette is mostly obscured by the left lung consolidation. Cardiac silhouette is grossly normal in size. No pneumothorax. IMPRESSION: 1. Mild worsening of left lung aeration when compared to the prior exams. Consolidation has increased in the left upper lobe and persists throughout the remainder of the left lung sparing only the left apex. Electronically Signed   By: Lajean Manes M.D.   On: 09/30/2016 08:19   Dg C-arm Bronchoscopy  Result Date: 10/11/2016 C-ARM BRONCHOSCOPY: Fluoroscopy was utilized by the requesting physician.  No radiographic interpretation.    Discharge Exam: Vitals:   10/13/16 0736 10/13/16 1022  BP:  115/72  Pulse:    Resp:    Temp:    SpO2: 95%    Vitals:   10/13/16 0017 10/13/16 0620 10/13/16 0736 10/13/16 1022  BP: 107/69 113/70  115/72  Pulse: 76 77    Resp: 18 20    Temp: 97.9 F (36.6 C) 97.9 F (36.6 C)    TempSrc: Oral Oral    SpO2: 91% 91% 95%   Weight:  55.9 kg (123 lb 3.8 oz)    Height:        General: Pt is alert, follows commands appropriately, not in acute distress Cardiovascular: Regular rate and rhythm, S1/S2 +, no murmurs, no rubs, no gallops Respiratory: course sounds on the left side Abdominal: Soft, non tender, non distended, bowel sounds +, no guarding Extremities: no edema, no cyanosis, pulses palpable bilaterally DP and PT Neuro: Grossly nonfocal  Discharge Instructions   Allergies as  of 10/14/2016   No Known Allergies     Medication List    STOP taking these medications   amoxicillin 500 MG capsule Commonly known as:  AMOXIL   levofloxacin 500 MG tablet Commonly known as:  LEVAQUIN   oseltamivir 30 MG capsule Commonly known as:  TAMIFLU     TAKE these medications   albuterol (2.5 MG/3ML) 0.083% nebulizer solution Commonly known  as:  PROVENTIL Take 2.5 mg by nebulization every 6 (six) hours as needed for wheezing or shortness of breath.   aspirin 81 MG chewable tablet Chew by mouth daily.   budesonide-formoterol 160-4.5 MCG/ACT inhaler Commonly known as:  SYMBICORT Inhale 2 puffs into the lungs 2 (two) times daily.   diltiazem 180 MG 24 hr capsule Commonly known as:  DILACOR XR Take 180 mg by mouth daily.   furosemide 40 MG tablet Commonly known as:  LASIX Take 40 mg by mouth.   levalbuterol 1.25 MG/0.5ML nebulizer solution Commonly known as:  XOPENEX Take 1.25 mg by nebulization every 6 (six) hours as needed for wheezing or shortness of breath.   levalbuterol 1.25 MG/0.5ML nebulizer solution Commonly known as:  XOPENEX Take 1.25 mg by nebulization 3 (three) times daily.   ranitidine 150 MG tablet Commonly known as:  ZANTAC Take 150 mg by mouth 2 (two) times daily.   warfarin 5 MG tablet Commonly known as:  COUMADIN Take 5 mg by mouth daily.            Durable Medical Equipment        Start     Ordered   10/13/16 1500  For home use only DME oxygen  Once    Question Answer Comment  Mode or (Route) Nasal cannula   Liters per Minute 3   Frequency Continuous (stationary and portable oxygen unit needed)   Oxygen delivery system Gas      10/13/16 1500       Discharge Care Instructions        Start     Ordered   10/13/16 0000  levalbuterol (XOPENEX) 1.25 MG/0.5ML nebulizer solution  Every 6 hours PRN     10/13/16 1323   10/13/16 0000  levalbuterol (XOPENEX) 1.25 MG/0.5ML nebulizer solution  3 times daily     10/13/16 1323         The results of significant diagnostics from this hospitalization (including imaging, microbiology, ancillary and laboratory) are listed below for reference.     Microbiology: Recent Results (from the past 240 hour(s))  Fungus Culture With Stain     Status: None (Preliminary result)   Collection Time: 10/06/16  5:41 PM  Result Value Ref Range  Status   Fungus Stain Final report  Final    Comment: (NOTE) Performed At: [facility] [place], [place] [id number] Lindon Romp MD LK:[id number]    Fungus (Mycology) Culture PENDING  Incomplete   Fungal Source EXPECTORATED SPUTUM  Final  Acid Fast Smear (AFB)     Status: None   Collection Time: 10/06/16  5:41 PM  Result Value Ref Range Status   AFB Specimen Processing Concentration  Final   Acid Fast Smear Negative  Final    Comment: (NOTE) Performed At: [facility] [place], [place] [id number] Lindon Romp MD QV:[id number]    Source (AFB) EXPECTORATED SPUTUM  Final  Fungus Culture Result     Status: None   Collection Time: 10/06/16  5:41 PM  Result Value Ref Range Status  Result 1 Comment  Final    Comment: (NOTE) Fungal elements, such as arthroconidia, hyphal fragments, chlamydoconidia, observed. Performed At: [facility] [address] [place], [place] [id number] Lindon Romp MD WN:[id number]   Culture, expectorated sputum-assessment     Status: None   Collection Time: 10/07/16 10:25 AM  Result Value Ref Range Status   Specimen Description SPUTUM  Final   Special Requests Normal  Final   Sputum evaluation   Final    Sputum specimen not acceptable for testing.  Please recollect.   NOTIFIED PAM RN [phone] [email or web address]    Report Status 10/09/2016 FINAL  Final  Culture, bal-quantitative     Status: Abnormal   Collection Time: 10/11/16  9:36 AM  Result Value Ref Range Status   Specimen Description BRONCHIAL ALVEOLAR LAVAGE  Final   Special Requests Normal  Final   Gram Stain   Final    FEW WBC PRESENT,BOTH PMN AND MONONUCLEAR RARE SQUAMOUS EPITHELIAL CELLS PRESENT RARE GRAM POSITIVE RODS    Culture (A)  Final    20,000 COLONIES/mL Consistent with normal respiratory flora. Performed at [facility], [place] [address] [place], [place] [address]    Report Status 10/13/2016 FINAL  Final  Acid  Fast Smear (AFB)     Status: None   Collection Time: 10/11/16  9:50 AM  Result Value Ref Range Status   AFB Specimen Processing Concentration  Final   Acid Fast Smear Negative  Final    Comment: (NOTE) Performed At: [facility] [place], [place] [id number] Lindon Romp MD CZ:[id number]    Source (AFB) BRONCHIAL ALVEOLAR LAVAGE  Final  Fungus Culture With Stain     Status: None (Preliminary result)   Collection Time: 10/11/16  9:50 AM  Result Value Ref Range Status   Fungus Stain Final report  Final    Comment: (NOTE) Performed At: [facility] [place], [place] [id number] Lindon Romp MD UK:[id number]    Fungus (Mycology) Culture PENDING  Incomplete   Fungal Source BRONCHIAL ALVEOLAR LAVAGE  Final  Fungus Culture Result     Status: None   Collection Time: 10/11/16  9:50 AM  Result Value Ref Range Status   Result 1 Comment  Final    Comment: (NOTE) KOH/Calcofluor preparation:  no fungus observed. Performed At: [facility] [place], [place] [id number] Lindon Romp MD OH:[id number]      Labs: Basic Metabolic Panel:  Recent Labs Lab 10/09/16 0423 10/10/16 0401 10/11/16 0357 10/12/16 0349 10/13/16 0413  NA 138 136 134* 136 138  K 3.8 3.8 3.9 4.2 5.0  CL 98* 99* 99* 101 102  CO2 33* _0 GLUCOSE 104* 116* 160* 188* 95  BUN 23* _1 CREATININE 0.49 0.44 0.52 0.45 0.44  CALCIUM 9.3 8.8* 9.1 9.0 9.1   CBC:  Recent Labs Lab 10/09/16 0423 10/10/16 0401 10/11/16 0357 10/12/16 0349 10/13/16 0413  WBC 10.5 10.1 12.6* 12.1* 11.2*  HGB 12.0 12.3 11.9* 11.4* 11.6*  HCT 37.6 38.3 36.3 36.0 36.8  MCV 83.6 84.2 84.8 85.7 86.8  PLT 403* 391 360 353 338    BNP (last 3 results)  Recent Labs  09/27/16 1900  BNP 265.9*    SIGNED: Time coordinating discharge: 60 minutes  Faye Ramsay, MD  [facility] 10/13/2016, 1:24 PM Pager [phone]  If 7PM-7AM,  please contact night-coverage [email or web address] Password [email or web address]

## 2016-10-13 NOTE — Progress Notes (Signed)
CRITICAL VALUE ALERT  Critical Value:  Positive body fluid yeast  Date & Time Notied:  2000 10/13/16   Provider Notified: 8:47 PM   Orders Received/Actions taken: n/a

## 2016-10-13 NOTE — Progress Notes (Signed)
Bronch path reviewed- atypical cells suspicious for well differentiated adenocarcinoma. I have discussed with Dr.Myers who will inform the daughter. The daughter wants to be told about the results before approaching the patient. Recommend getting onc involved. We will be available as needed. Please call with any questions.  Marshell Garfinkel MD [place] Pager [phone] If no answer or after 3pm call: [phone] 10/13/2016, 11:53 AM

## 2016-10-13 NOTE — Discharge Instructions (Signed)
??????. °(  Shortness of Breath) ???? ? ??? ??????, ?????? ??? ?????? ??????. ???? ? ??? ??????, ??? ???? ??????????????? ?????????? ? ?????. ???? ? ???????? ????????  ?? ?????????????.  ?????????? ?? ???????????? ??????????? ??????????? ?????????? ?????????? ??? ??????? (???? ??????, ????), ??????? ????? ???????????? ?????? ?? ???????.  ????????? ?? ???? ?????????????. ???????? ????????????? ??????? ???? ????????????.  ?????????? ?????? ?? ????????????? ?????????, ????? ??????? ???????? ????.  ??????? ?? ??? ??????????? ?????? ? ?????.  ?????????? ?????????? ?? ???????, ????:  ?????? ?????????? ???????.  ?? ???????? ???????? ? ??????, ??????? ???????? (???????), ??? ? ??? ??????? ??????, ??????? ?? ?????????? ??????? ????????.  ?? ???????????? ????? ??? ?????.  ??? ?????? ??????.  ? ??? ????????? ???? ? ?????, ? ????? ??? ? ?????? (??????? ???????).  ? ??? ?????????? ???????????.  ?? ?? ?????? ??????????? ?????? ?? ???????? ??? ????????? ??????? ?????????? ??????????.  ??? ????? ????? ?? ???????????? ?????.  ??? ?????? ????? ???????????? ???????????? ???? ? ?????????? ?? ?????.  ? ??? ???????? ???????? ??????? ?? ??????????? ?????????.  ? ??? ????????? ????? ????????.  ?????????, ??? ??:  ????????? ?????? ??????????.  ?????? ?????????????? ??????? ?? ????? ??????????.  ??????????????? ?????????? ? ?????, ???? ??? ?? ?????????? ????? ??? ?????????? ????.  ??? ?????????? ?? ????? ???????? ??????, ??????????????? ????? ??????. ??????????? ???????? ????? ???????????? ??? ??????? ? ????? ??????? ??????. Document Released: 01/19/2011 Document Revised: 02/04/2013 Document Reviewed: 07/08/2015 Elsevier Interactive Patient Education  2017 [employer].

## 2016-10-13 NOTE — Progress Notes (Signed)
[place] for Coumadin Indication: unspecified tachycardia  No Known Allergies  Patient Measurements: Height: 5\' 1"  (154.9 cm) Weight: 123 lb 3.8 oz (55.9 kg) IBW/kg (Calculated) : 47.8  Vital Signs: Temp: 97.9 F (36.6 C) (08/31 0620) Temp Source: Oral (08/31 0620) BP: 113/70 (08/31 0620) Pulse Rate: 77 (08/31 0620)  Labs:  Recent Labs  10/11/16 0357 10/12/16 0349 10/13/16 0413  HGB 11.9* 11.4* 11.6*  HCT 36.3 36.0 36.8  PLT 360 353 338  LABPROT  --  12.4 15.4*  INR  --  0.93 1.23  CREATININE 0.52 0.45 0.44    Estimated Creatinine Clearance: 44.4 mL/min (by C-G formula based on SCr of 0.44 mg/dL).   Medical History: Past Medical History:  Diagnosis Date  . Hyperlipidemia   . Pneumonia   . Tachyarrhythmia     Medications:  Scheduled:  . acidophilus  1 capsule Oral Daily  . aspirin  81 mg Oral Daily  . diltiazem  180 mg Oral Daily  . docusate sodium  100 mg Oral Daily  . famotidine  20 mg Oral BID  . guaiFENesin  1,200 mg Oral BID  . levalbuterol  1.25 mg Nebulization TID  . mouth rinse  15 mL Mouth Rinse BID  . nystatin   Topical TID  . nystatin cream   Topical BID  . polyethylene glycol  17 g Oral Daily  . predniSONE  20 mg Oral Q breakfast  . senna  2 tablet Oral QHS  . Warfarin - Pharmacist Dosing Inpatient   Does not apply q1800   Infusions:  . sodium chloride 10 mL/hr at 10/11/16 0900   PRN: chlorpheniramine-HYDROcodone, levalbuterol, metoprolol tartrate, ondansetron (ZOFRAN) IV    Assessment: 76 yo female admitted with pneumonia. Her daughter reports that she is taking warfarin 5mg  daily for an abnormal heart rhythm but has not taken it in the past week. She was unable to say what physician monitors the INR.  INR is subtherapeutic at 0.92. Pharmacy is consulted to resume dosing now.  8/29 Warfarin can now be resumed after undergoing bronchoscopy.   10/13/2016:  INR subtherapeutic at 1.23  after 5 mg  dose on 8/30  CBC: Hgb at 11.6, Pltc WNL  Some blood reported in sputum, but thought to be related to lung issues and not warfarin  Regular diet ordered  Drug-drug interactions- no major DDI.    Goal of Therapy:  INR 2-3 Monitor platelets by anticoagulation protocol: Yes  Plan:   Warfarin 5mg  PO x 1 today  Daily PT/INR  Consider stopping aspirin with concurrent warfarin therapy    Royetta Asal, PharmD, BCPS Pager [phone] 10/13/2016 9:30 AM

## 2016-10-14 LAB — BASIC METABOLIC PANEL
Anion gap: 5 (ref 5–15)
BUN: 14 mg/dL (ref 6–20)
CALCIUM: 9.1 mg/dL (ref 8.9–10.3)
CO2: 30 mmol/L (ref 22–32)
CREATININE: 0.47 mg/dL (ref 0.44–1.00)
Chloride: 101 mmol/L (ref 101–111)
GFR calc non Af Amer: >60 mL/min (ref >60)
Glucose, Bld: 98 mg/dL (ref 65–99)
Potassium: 4 mmol/L (ref 3.5–5.1)
SODIUM: 136 mmol/L (ref 135–145)

## 2016-10-14 LAB — CBC
HCT: 37 % (ref 36.0–46.0)
Hemoglobin: 11.7 g/dL — ABNORMAL LOW (ref 12.0–15.0)
MCH: 27.1 pg (ref 26.0–34.0)
MCHC: 31.6 g/dL (ref 30.0–36.0)
MCV: 85.6 fL (ref 78.0–100.0)
Platelets: 367 10*3/uL (ref 150–400)
RBC: 4.32 MIL/uL (ref 3.87–5.11)
RDW: 15.2 % (ref 11.5–15.5)
WBC: 10 10*3/uL (ref 4.0–10.5)

## 2016-10-14 LAB — PROTIME-INR
INR: 1.72
Prothrombin Time: 20 seconds — ABNORMAL HIGH (ref 11.4–15.2)

## 2016-10-14 MED ORDER — WARFARIN SODIUM 5 MG PO TABS
5.0000 mg | ORAL_TABLET | Freq: Once | ORAL | Status: AC
  Administered 2016-10-14: 5 mg via ORAL
  Filled 2016-10-14: qty 1

## 2016-10-14 NOTE — Progress Notes (Signed)
[facility] for Coumadin Indication: unspecified tachycardia  No Known Allergies  Patient Measurements: Height: 5\' 1"  (154.9 cm) Weight: 121 lb 11.1 oz (55.2 kg) IBW/kg (Calculated) : 47.8  Vital Signs: Temp: 97.9 F (36.6 C) (09/01 0439) Temp Source: Oral (09/01 0439) BP: 107/69 (09/01 0439) Pulse Rate: 80 (09/01 0753)  Labs:  Recent Labs  10/12/16 0349 10/13/16 0413 10/14/16 0520  HGB 11.4* 11.6* 11.7*  HCT 36.0 36.8 37.0  PLT 353 338 367  LABPROT 12.4 15.4* 20.0*  INR 0.93 1.23 1.72  CREATININE 0.45 0.44 0.47    Estimated Creatinine Clearance: 44.4 mL/min (by C-G formula based on SCr of 0.47 mg/dL).   Medical History: Past Medical History:  Diagnosis Date  . Hyperlipidemia   . Pneumonia   . Tachyarrhythmia     Medications:  Scheduled:  . acidophilus  1 capsule Oral Daily  . aspirin  81 mg Oral Daily  . diltiazem  180 mg Oral Daily  . docusate sodium  100 mg Oral Daily  . famotidine  20 mg Oral BID  . guaiFENesin  1,200 mg Oral BID  . levalbuterol  1.25 mg Nebulization TID  . mouth rinse  15 mL Mouth Rinse BID  . nystatin   Topical TID  . nystatin cream   Topical BID  . polyethylene glycol  17 g Oral Daily  . predniSONE  20 mg Oral Q breakfast  . senna  2 tablet Oral QHS  . Warfarin - Pharmacist Dosing Inpatient   Does not apply q1800   Infusions:  . sodium chloride 10 mL/hr at 10/13/16 2158   PRN: chlorpheniramine-HYDROcodone, levalbuterol, metoprolol tartrate, ondansetron (ZOFRAN) IV    Assessment: 76 yo female admitted with pneumonia. Her daughter reports that she is taking warfarin 5mg  daily for an abnormal heart rhythm but has not taken it in the past week. She was unable to say what physician monitors the INR.  INR is subtherapeutic at 0.92. Pharmacy is consulted to resume dosing now.  8/29 Warfarin can now be resumed after undergoing bronchoscopy.   10/14/2016:  INR subtherapeutic at 1.72  But large  increase from yesterday  CBC: Hgb at 11.7, Pltc WNL  Regular diet ordered  Drug-drug interactions- no major DDI.    Goal of Therapy:  INR 2-3 Monitor platelets by anticoagulation protocol: Yes  Plan:   Warfarin 5mg  PO x 1 today  Daily PT/INR  Consider stopping aspirin with concurrent warfarin therapy    Dolly Rias RPh 10/14/2016, 9:30 AM Pager [phone]

## 2016-10-14 NOTE — Progress Notes (Signed)
Plan for discharge but awaiting for family to arrive so we can discuss disposition.   Faye Ramsay, MD  [facility] Pager [phone]  If 7PM-7AM, please contact night-coverage [email or web address] Password [email or web address]

## 2016-10-15 LAB — BASIC METABOLIC PANEL
ANION GAP: 8 (ref 5–15)
BUN: 16 mg/dL (ref 6–20)
CALCIUM: 9.1 mg/dL (ref 8.9–10.3)
CO2: 28 mmol/L (ref 22–32)
CREATININE: 0.58 mg/dL (ref 0.44–1.00)
Chloride: 101 mmol/L (ref 101–111)
GFR calc Af Amer: >60 mL/min (ref >60)
GLUCOSE: 172 mg/dL — AB (ref 65–99)
Potassium: 4.5 mmol/L (ref 3.5–5.1)
Sodium: 137 mmol/L (ref 135–145)

## 2016-10-15 LAB — PROTIME-INR
INR: 2.07
PROTHROMBIN TIME: 23.2 s — AB (ref 11.4–15.2)

## 2016-10-15 LAB — CBC
HCT: 36.1 % (ref 36.0–46.0)
Hemoglobin: 11.6 g/dL — ABNORMAL LOW (ref 12.0–15.0)
MCH: 27.2 pg (ref 26.0–34.0)
MCHC: 32.1 g/dL (ref 30.0–36.0)
MCV: 84.7 fL (ref 78.0–100.0)
Platelets: 343 10*3/uL (ref 150–400)
RBC: 4.26 MIL/uL (ref 3.87–5.11)
RDW: 14.9 % (ref 11.5–15.5)
WBC: 11.3 10*3/uL — ABNORMAL HIGH (ref 4.0–10.5)

## 2016-10-15 MED ORDER — TRAZODONE HCL 50 MG PO TABS
50.0000 mg | ORAL_TABLET | Freq: Once | ORAL | Status: AC
  Administered 2016-10-15: 50 mg via ORAL
  Filled 2016-10-15: qty 1

## 2016-10-15 MED ORDER — WARFARIN SODIUM 5 MG PO TABS
5.0000 mg | ORAL_TABLET | Freq: Once | ORAL | Status: AC
  Administered 2016-10-15: 5 mg via ORAL
  Filled 2016-10-15: qty 1

## 2016-10-15 NOTE — Progress Notes (Signed)
PROGRESS NOTE  Michelle Mccall  YTW:[id number] DOB: 05/09/1940 DOA: 09/27/2016   PCP: No primary care provider on file.  Brief Narrative:  76 year old [place] female with history of dyslipidemia, unspecified tachyarrhythmia, multiple episodes of pneumonia in past 9 months, November and December 2017, April 2018. Patient transferred from [facility] with progressive shortness of breath at rest and on exertion. No fevers or chills.  She was treated with steroids, antibiotics, and diuretics with no improvement.  No obvious aspiration noted by SLP. Sputum cytology nondiagnostic.   Assessment & Plan:  Persistent acute respiratory failure with hypoxia with near white-out of the left lung - still with productive cough and exertional dyspnea, completed 10 days of IV ABX, course of steroids, diuresis - AFB smear negative, cultures pending  - broncho done 8/29 - normal airway anatomy, BAL performed, bx taken, prelim report with adenocarcinoma - unable to d/c pt as family is not here to pick her up and daughter does not want info disclosed to mom until we speak first to her, she is however unreachable on her phone   Hemoptysis - more blood in sputum this AM - awaiting from bronch results - left multiple messages for daughter to discuss   Hypokalemia / Hyperkalemia  - BMP in AM  Hypotension - likely due to combination of lasix and resuming diltiazem, resolved. - Cardizem is sufficient   Episodic SVT with known history of tachyarrhythmia despite starting diltiazem, asymptomatic - Continue diltiazem - cont Coumadin   Normocytic anemia - CBC in AM  Constipation - had BM this AM   DVT prophylaxis:  lovenox Code Status:   Full code Family Communication:  Pt with ipad interpreter, daughter at bedside  Disposition Plan:  Home when daughter picks her up, pt does not drive and how no place her own, lives with daughter who is apparently out of town   Consultants:   PCCM  Procedures:   [place]  8/29   Antimicrobials:  Anti-infectives    Start     Dose/Rate Route Frequency Ordered Stop   10/01/16 1200  ceFEPIme (MAXIPIME) 1 g in dextrose 5 % 50 mL IVPB  Status:  Discontinued     1 g 100 mL/hr over 30 Minutes Intravenous Every 12 hours 10/01/16 1028 10/06/16 1754   09/28/16 2100  vancomycin (VANCOCIN) IVPB 750 mg/150 ml premix  Status:  Discontinued     750 mg 150 mL/hr over 60 Minutes Intravenous Every 24 hours 09/27/16 2102 09/30/16 0647   09/28/16 2100  ceFEPIme (MAXIPIME) 1 g in dextrose 5 % 50 mL IVPB  Status:  Discontinued     1 g 100 mL/hr over 30 Minutes Intravenous Every 24 hours 09/27/16 2104 10/01/16 1028   09/27/16 2200  ceFEPIme (MAXIPIME) 1 g in dextrose 5 % 50 mL IVPB  Status:  Discontinued     1 g 100 mL/hr over 30 Minutes Intravenous Every 8 hours 09/27/16 2050 09/27/16 2104   09/27/16 2100  vancomycin (VANCOCIN) IVPB 1000 mg/200 mL premix     1,000 mg 200 mL/hr over 60 Minutes Intravenous  Once 09/27/16 2050 09/27/16 2245   09/27/16 2057  ceFEPIme (MAXIPIME) 1 g injection    Comments:  Woodward Ku   : cabinet override      09/27/16 2057 09/28/16 0859     Subjective: Still with hemoptysis.  Objective: Vitals:   10/14/16 2039 10/14/16 2043 10/15/16 0456 10/15/16 0737  BP: (!) 89/56 (!) 91/53 (!) 98/57 121/67  Pulse: 71  75  Resp: 20  20   Temp: 98.4 F (36.9 C)  98.4 F (36.9 C) 98.3 F (36.8 C)  TempSrc: Oral  Oral Oral  SpO2: 94%  93% 93%  Weight:   55.2 kg (121 lb 11.1 oz)   Height:        Intake/Output Summary (Last 24 hours) at 10/15/16 1201 Last data filed at 10/15/16 0300  Gross per 24 hour  Intake           770.83 ml  Output             1000 ml  Net          -229.17 ml   Filed Weights   10/13/16 0620 10/14/16 0441 10/15/16 0456  Weight: 55.9 kg (123 lb 3.8 oz) 55.2 kg (121 lb 11.1 oz) 55.2 kg (121 lb 11.1 oz)    Physical Exam  Constitutional: Appears calm, NAD CVS: RRR, S1/S2 +, no murmurs, no gallops, no carotid bruit.    Pulmonary: rhonchi on the left side  Abdominal: Soft. BS +,  no distension, tenderness, rebound or guarding.  Musculoskeletal: Normal range of motion. No edema and no tenderness.   Data Reviewed: I have personally reviewed following labs and imaging studies  CBC:  Recent Labs Lab 10/11/16 0357 10/12/16 0349 10/13/16 0413 10/14/16 0520 10/15/16 0431  WBC 12.6* 12.1* 11.2* 10.0 11.3*  HGB 11.9* 11.4* 11.6* 11.7* 11.6*  HCT 36.3 36.0 36.8 37.0 36.1  MCV 84.8 85.7 86.8 85.6 84.7  PLT 360 353 338 367 093   Basic Metabolic Panel:  Recent Labs Lab 10/11/16 0357 10/12/16 0349 10/13/16 0413 10/14/16 0520 10/15/16 0431  NA 134* 136 138 136 137  K 3.9 4.2 5.0 4.0 4.5  CL 99* 101 102 101 101  CO2 _0 GLUCOSE 160* 188* 95 98 172*  BUN _1 CREATININE 0.52 0.45 0.44 0.47 0.58  CALCIUM 9.1 9.0 9.1 9.1 9.1   Coagulation Profile:  Recent Labs Lab 10/09/16 0423 10/12/16 0349 10/13/16 0413 10/14/16 0520 10/15/16 0431  INR 1.56 0.93 1.23 1.72 2.07   Recent Results (from the past 240 hour(s))  Fungus Culture With Stain     Status: None   Collection Time: 10/06/16  5:41 PM  Result Value Ref Range Status   Fungus Stain Final report  Final   Fungus (Mycology) Culture Preliminary report  Final    Comment: (NOTE) Performed At: [facility] [place], [place] [id number] Lindon Romp MD IR:[id number]    Fungal Source EXPECTORATED SPUTUM  Final  Acid Fast Smear (AFB)     Status: None   Collection Time: 10/06/16  5:41 PM  Result Value Ref Range Status   AFB Specimen Processing Concentration  Final   Acid Fast Smear Negative  Final    Comment: (NOTE) Performed At: [facility] [place], [place] [id number] Lindon Romp MD PO:[id number]    Source (AFB) EXPECTORATED SPUTUM  Final  Fungus Culture Result     Status: None   Collection Time: 10/06/16  5:41 PM  Result Value Ref Range Status   Result 1  Comment  Final    Comment: (NOTE) Fungal elements, such as arthroconidia, hyphal fragments, chlamydoconidia, observed. Performed At: [facility] [place], [place] [id number] Lindon Romp MD YP:[id number]   Fungal organism reflex     Status: None   Collection Time: 10/06/16  5:41 PM  Result Value Ref  Range Status   Fungal result 1 Candida albicans  Corrected    Comment: (NOTE) Light growth Reported pos yeast isolated to Earlyne Iba on 10-13-16, at 5:23pm. Faxed to [phone]. Performed At: [facility] [address] [place], [place] [id number] Lindon Romp MD NG:[id number] CORRECTED ON 09/01 AT 1631: PREVIOUSLY REPORTED AS Comment   Culture, expectorated sputum-assessment     Status: None   Collection Time: 10/07/16 10:25 AM  Result Value Ref Range Status   Specimen Description SPUTUM  Final   Special Requests Normal  Final   Sputum evaluation   Final    Sputum specimen not acceptable for testing.  Please recollect.   NOTIFIED PAM RN [phone] [email or web address]    Report Status 10/09/2016 FINAL  Final  Culture, bal-quantitative     Status: Abnormal   Collection Time: 10/11/16  9:36 AM  Result Value Ref Range Status   Specimen Description BRONCHIAL ALVEOLAR LAVAGE  Final   Special Requests Normal  Final   Gram Stain   Final    FEW WBC PRESENT,BOTH PMN AND MONONUCLEAR RARE SQUAMOUS EPITHELIAL CELLS PRESENT RARE GRAM POSITIVE RODS    Culture (A)  Final    20,000 COLONIES/mL Consistent with normal respiratory flora. Performed at [facility], [place] [address], [place], [place] [address]    Report Status 10/13/2016 FINAL  Final  Acid Fast Smear (AFB)     Status: None   Collection Time: 10/11/16  9:50 AM  Result Value Ref Range Status   AFB Specimen Processing Concentration  Final   Acid Fast Smear Negative  Final    Comment: (NOTE) Performed At: [facility] [place], [place] [id number] Lindon Romp MD  OV:[id number]    Source (AFB) BRONCHIAL ALVEOLAR LAVAGE  Final  Fungus Culture With Stain     Status: None (Preliminary result)   Collection Time: 10/11/16  9:50 AM  Result Value Ref Range Status   Fungus Stain Final report  Final    Comment: (NOTE) Performed At: [facility] [place], [place] [id number] Lindon Romp MD ZS:[id number]    Fungus (Mycology) Culture PENDING  Incomplete   Fungal Source BRONCHIAL ALVEOLAR LAVAGE  Final  Fungus Culture Result     Status: None   Collection Time: 10/11/16  9:50 AM  Result Value Ref Range Status   Result 1 Comment  Final    Comment: (NOTE) KOH/Calcofluor preparation:  no fungus observed. Performed At: [facility] [address] [place], [place] [id number] Lindon Romp MD CW:[id number]     Radiology Studies: No results found.   Scheduled Meds: . acidophilus  1 capsule Oral Daily  . aspirin  81 mg Oral Daily  . diltiazem  180 mg Oral Daily  . docusate sodium  100 mg Oral Daily  . famotidine  20 mg Oral BID  . guaiFENesin  1,200 mg Oral BID  . levalbuterol  1.25 mg Nebulization TID  . mouth rinse  15 mL Mouth Rinse BID  . nystatin   Topical TID  . nystatin cream   Topical BID  . polyethylene glycol  17 g Oral Daily  . predniSONE  20 mg Oral Q breakfast  . senna  2 tablet Oral QHS  . warfarin  5 mg Oral ONCE-1800  . Warfarin - Pharmacist Dosing Inpatient   Does not apply q1800   Continuous Infusions: . sodium chloride 10 mL/hr at 10/13/16 2158     LOS: 18 days  Time spent: 25 minutes   Faye Ramsay, MD [facility] Pager [phone]  If 7PM-7AM, please contact night-coverage [email or web address] Password [email or web address] 10/15/2016, 12:01 PM

## 2016-10-15 NOTE — Progress Notes (Signed)
[place] for Coumadin Indication: unspecified tachycardia  No Known Allergies  Patient Measurements: Height: 5\' 1"  (154.9 cm) Weight: 121 lb 11.1 oz (55.2 kg) IBW/kg (Calculated) : 47.8  Vital Signs: Temp: 98.3 F (36.8 C) (09/02 0737) Temp Source: Oral (09/02 0737) BP: 121/67 (09/02 0737) Pulse Rate: 75 (09/02 0456)  Labs:  Recent Labs  10/13/16 0413 10/14/16 0520 10/15/16 0431  HGB 11.6* 11.7* 11.6*  HCT 36.8 37.0 36.1  PLT 338 367 343  LABPROT 15.4* 20.0* 23.2*  INR 1.23 1.72 2.07  CREATININE 0.44 0.47 0.58    Estimated Creatinine Clearance: 44.4 mL/min (by C-G formula based on SCr of 0.58 mg/dL).   Medical History: Past Medical History:  Diagnosis Date  . Hyperlipidemia   . Pneumonia   . Tachyarrhythmia     Medications:  Scheduled:  . acidophilus  1 capsule Oral Daily  . aspirin  81 mg Oral Daily  . diltiazem  180 mg Oral Daily  . docusate sodium  100 mg Oral Daily  . famotidine  20 mg Oral BID  . guaiFENesin  1,200 mg Oral BID  . levalbuterol  1.25 mg Nebulization TID  . mouth rinse  15 mL Mouth Rinse BID  . nystatin   Topical TID  . nystatin cream   Topical BID  . polyethylene glycol  17 g Oral Daily  . predniSONE  20 mg Oral Q breakfast  . senna  2 tablet Oral QHS  . Warfarin - Pharmacist Dosing Inpatient   Does not apply q1800   Infusions:  . sodium chloride 10 mL/hr at 10/13/16 2158   PRN: chlorpheniramine-HYDROcodone, levalbuterol, metoprolol tartrate, ondansetron (ZOFRAN) IV    Assessment: 76 yo female admitted with pneumonia. Her daughter reports that she is taking warfarin 5mg  daily for an abnormal heart rhythm but has not taken it in the past week. She was unable to say what physician monitors the INR.  INR is subtherapeutic at 0.92. Pharmacy is consulted to resume dosing now.  8/29 Warfarin can now be resumed after undergoing bronchoscopy.   10/15/2016:  INR therapeutic at 2.07    CBC: Hgb  at 11.6, Pltc WNL  Regular diet ordered  Drug-drug interactions- no major DDI.    Goal of Therapy:  INR 2-3 Monitor platelets by anticoagulation protocol: Yes  Plan:   Warfarin 5mg  PO x 1 today  Daily PT/INR  Consider stopping aspirin with concurrent warfarin therapy    Dolly Rias RPh 10/15/2016, 8:27 AM Pager [phone]

## 2016-10-15 NOTE — Progress Notes (Signed)
Pt's vs stable with oxygen sat at 93 on 3 L of oxygen. Pt complains of pain in lungs (bilateral). Pt rates pain at 6/10 on 0-10 pain scale.Pt states pain is worse with exertion. Pt states they feel they would be better if they could rest but is unable to due to coughing. Discussed pt's pain and inability to sleep with RN. RN discussed pt's situation with HCP. Prn medication prescribed. Pt was educated to inform RN when pt was ready for medication. Pt stated they would try it after their meal this evening. Handoff with RN perform. Continue with care.

## 2016-10-16 LAB — CBC
HCT: 39 % (ref 36.0–46.0)
HEMOGLOBIN: 12.6 g/dL (ref 12.0–15.0)
MCH: 27.7 pg (ref 26.0–34.0)
MCHC: 32.3 g/dL (ref 30.0–36.0)
MCV: 85.7 fL (ref 78.0–100.0)
Platelets: 386 10*3/uL (ref 150–400)
RBC: 4.55 MIL/uL (ref 3.87–5.11)
RDW: 15.3 % (ref 11.5–15.5)
WBC: 12 10*3/uL — AB (ref 4.0–10.5)

## 2016-10-16 LAB — BASIC METABOLIC PANEL
ANION GAP: 8 (ref 5–15)
BUN: 14 mg/dL (ref 6–20)
CALCIUM: 9.3 mg/dL (ref 8.9–10.3)
CHLORIDE: 100 mmol/L — AB (ref 101–111)
CO2: 29 mmol/L (ref 22–32)
CREATININE: 0.44 mg/dL (ref 0.44–1.00)
GFR calc non Af Amer: >60 mL/min (ref >60)
Glucose, Bld: 103 mg/dL — ABNORMAL HIGH (ref 65–99)
Potassium: 4.5 mmol/L (ref 3.5–5.1)
SODIUM: 137 mmol/L (ref 135–145)

## 2016-10-16 LAB — PROTIME-INR
INR: 2.3
PROTHROMBIN TIME: 25.1 s — AB (ref 11.4–15.2)

## 2016-10-16 MED ORDER — WARFARIN SODIUM 5 MG PO TABS: 5.0000 mg | ORAL_TABLET | Freq: Once | ORAL | Status: DC

## 2016-10-16 NOTE — Progress Notes (Signed)
Attempted to review AVS with patient and daughter. When reviewing medications the daughter claimed the patient had none of the medicines at home. The bag of medications provided by the daughter was all in [place] and could not be compared to the AVS. Pharmacist came to help but with not much success. Daughter verbalized that she would call Dr. Doyle Askew tomorrow to clarify medicines. When attempting to contact the interpreter, the patient and daughter were adamant about signing the AVS and leaving. RN tried to confirm with the patient that she understood all that was going on in regards to her possible cancer. The daughter interrupted and stated "She does not know and I was to keep her happy for tonight." Patient signed AVS and was discharged. Randa Lynn RN as witness to the situation.

## 2016-10-16 NOTE — Discharge Summary (Signed)
Physician Discharge Summary  Michelle Mccall VOZ:[id number] DOB: 1940/12/19 DOA: 09/27/2016  PCP: No primary care provider on file.  Admit date: 09/27/2016 Discharge date: 10/16/2016  Recommendations for Outpatient Follow-up:  1. Pt will need to follow up with PCP in 1-2 weeks post discharge 2. Please obtain BMP to evaluate electrolytes and kidney function 3. Please also check CBC to evaluate Hg and Hct levels 4. Pt will be discharged on oxygen via [place] 3 L 5. New diagnosis of lung adenocarcinoma, awaiting for family to call us so we can discuss further disposition, daughter asked not to tell pt anything until we speak with her first. We are still awaiting for family to come or call us 6. If family desires, oncology referral will be provided   Discharge Diagnoses:  Principal Problem:   Sepsis ([place]) Active Problems:   HCAP (healthcare-associated pneumonia)   Hypokalemia   Anemia   Hypoalbuminemia   Tachyarrhythmia   Hyperlipidemia   Acute respiratory failure with hypoxia and hypercapnia (HCC)   Abnormal EKG   Pulmonary infiltrates  Discharge Condition: Stable  Diet recommendation: Heart healthy diet discussed in details   History of present illness:  76 year old [place] female with history of dyslipidemia, unspecified tachyarrhythmia, multiple episodes of pneumonia in past 9 months, November and December 2017, April 2018. Patient transferred from [facility] with progressive shortness of breath at rest and on exertion. No fevers or chills.  She was treated with steroids, antibiotics, and diuretics with no improvement.  No obvious aspiration noted by SLP. Sputum cytology nondiagnostic.   Assessment & Plan:  Persistent acute respiratory failure with hypoxia with near white-out of the left lung - still with productive cough and exertional dyspnea, completed 10 days of IV ABX, course of steroids, diuresis - AFB smear negative, cultures pending  - broncho done 8/29 - normal airway  anatomy, BAL performed, bx taken, preliminary report c/w adenocarcinoma - complicated situation as daughter does not want information disclosed until she gets all the information first but daughter has not come to the hospital yet and had not returned any calls to date  - oncology referral can be provided depending on family preference   Hemoptysis - more blood in sputum this AM - due to lung cancer - will need oncology follow up but as noted above, we are awaiting for family to come or call us so we can discuss further plan   Hypokalemia / Hyperkalemia  - stable   Hypotension - likely due to combination of lasix and resuming diltiazem, resolved. - has been on Cardizem   Episodic SVT with known history of tachyarrhythmia despite starting diltiazem, asymptomatic - Continue diltiazem - cont Coumadin   Normocytic anemia - monitor Hg in an outpatient setting   Constipation - keep on bowel regimen  DVT prophylaxis:  lovenox Code Status:   Full code Family Communication:  Pt at bedside  Disposition Plan:  Home when family arrives   Consultants:   PCCM  Procedures:   Bronch 8/29   Procedures/Studies: Dg Chest 2 View  Result Date: 10/09/2016 CLINICAL DATA:  Acute respiratory failure EXAM: CHEST  2 VIEW COMPARISON:  10/06/2016 FINDINGS: Extensive left lower lobe and lingular airspace disease. 9 mm calcified right mid lung pulmonary nodule likely reflecting sequela prior granulomatous disease. Right lung is clear. Hyperinflated lungs likely reflecting underlying COPD. No pneumothorax. Stable cardiomediastinal silhouette. No acute osseous abnormality. IMPRESSION: 1. Extensive left lower lobe and lingular airspace disease most concerning for pneumonia. Electronically Signed   By: Elbert Ewings  Patel   On: 10/09/2016 10:53   Dg Chest 2 View  Result Date: 09/27/2016 CLINICAL DATA:  Productive cough. EXAM: CHEST  2 VIEW COMPARISON:  None. FINDINGS: Normal cardiac silhouette. There is  diffuse airspace disease involving the LEFT lower lobe and lingula. RIGHT lung history of airspace disease. There is a nodule in the RIGHT lung which appears calcified measuring 6 mm. Aggressive osseous lesion IMPRESSION: 1. Extensive LEFT lower lobe and lingular pneumonia. Consider contrast chest CT to exclude central obstructing lesion. 2. Calcified granuloma in the RIGHT lung. Electronically Signed   By: Suzy Bouchard M.D.   On: 09/27/2016 20:40   Ct Chest W Contrast  Result Date: 09/27/2016 CLINICAL DATA:  Pneumonia recurrent cough, shortness of breath EXAM: CT CHEST WITH CONTRAST TECHNIQUE: Multidetector CT imaging of the chest was performed during intravenous contrast administration. CONTRAST:  114m ISOVUE-300 IOPAMIDOL (ISOVUE-300) INJECTION 61% COMPARISON:  09/27/2016 FINDINGS: Cardiovascular: Non aneurysmal aorta. Minimal atherosclerotic calcifications. Normal heart size. No pericardial effusion. Mediastinum/Nodes: The midline trachea. No thyroid mass. Mild mediastinal adenopathy, largest lymph node is seen in the AP window and measures 1 cm. Esophagus within normal limits Lungs/Pleura: Tiny left pleural effusion. Extensive consolidations and ground-glass densities in the left lower and upper lobes with peripheral consolidations. Small amount of ground-glass density and consolidation in the right middle lobe. Subpleural nodularity in the right lower lobe. 12 mm right upper lobe pulmonary nodule. Upper Abdomen: No acute abnormality. Musculoskeletal: No chest wall abnormality. No acute or significant osseous findings. IMPRESSION: 1. Extensive consolidations and ground-glass density throughout the left upper and lower lobes, most consistent with pneumonia. Additional infiltrate is present within the right middle lobe. Mild subpleural nodularity in the right lower lobe is suspicious for additional small foci of infection. Small left pleural effusion. Imaging follow-up to resolution is recommended 2.  Mild mediastinal adenopathy, likely reactive Aortic Atherosclerosis (ICD10-I70.0). Electronically Signed   By: KDonavan FoilM.D.   On: 09/27/2016 22:36   Dg Esophagus  Result Date: 10/04/2016 CLINICAL DATA:  Coughing while drinking liquids. Evaluate for aspiration. EXAM: ESOPHOGRAM/BARIUM SWALLOW TECHNIQUE: Single contrast examination was performed using  thin barium. FLUOROSCOPY TIME:  Fluoroscopy Time:  0.7 minutes Radiation Exposure Index (if provided by the fluoroscopic device): 5.5 mGy. Number of Acquired Spot Images: 0 COMPARISON:  None. FINDINGS: Right anterior oblique and right lateral decubitus views during swallowing demonstrate no evidence of aspiration or significant penetration. IMPRESSION: No evidence of aspiration. Electronically Signed   By: WTitus DubinM.D.   On: 10/04/2016 12:31   Dg Chest Port 1 View  Result Date: 10/11/2016 CLINICAL DATA:  Left lower lobe lung biopsy. EXAM: PORTABLE CHEST 1 VIEW COMPARISON:  10/09/2016. FINDINGS: Cardiomegaly. Diffuse left lung infiltrate. Calcified pulmonary nodule right mid lung consistent with granuloma . Left-sided pleural effusion. Small right pleural effusion.No pneumothorax. Cardiomegaly. No pulmonary venous congestion . No acute bony abnormality. IMPRESSION: 1. Diffuse left lung field infiltrate and left-sided pleural effusion again noted. 2. Mild right base infiltrate cannot be excluded. Small right pleural effusion. 3. Cardiomegaly.  No pulmonary venous congestion. Electronically Signed   By: TMarcello Moores Register   On: 10/11/2016 10:04   Dg Chest Port 1 View  Result Date: 10/06/2016 CLINICAL DATA:  Dyspnea. EXAM: PORTABLE CHEST 1 VIEW COMPARISON:  Single-view of the chest 10/05/2016 and 10/03/2016. PA and lateral chest 09/27/2016. CT chest 09/27/2016. FINDINGS: Extensive airspace disease throughout almost the entire left chest and a left pleural effusion persist without marked change since the 09/27/2016 study. There  is a trace right  pleural effusion and mild right basilar airspace disease. Calcified granuloma on the right is noted. IMPRESSION: No change in extensive airspace disease left chest and left effusion compatible with pneumonia. Electronically Signed   By: Inge Rise M.D.   On: 10/06/2016 10:18   Dg Chest Port 1 View  Result Date: 10/05/2016 CLINICAL DATA:  Follow-up shortness of breath and pneumonia EXAM: PORTABLE CHEST 1 VIEW COMPARISON:  10/03/2016 FINDINGS: Cardiac shadow is stable but somewhat obscured due to significant left-sided infiltrate which is also stable from the prior exam. The right lung demonstrates minimal basilar atelectasis. No bony abnormality is seen. IMPRESSION: Overall stable appearing left lung infiltrate. Mild right basilar atelectasis is noted. Electronically Signed   By: Inez Catalina M.D.   On: 10/05/2016 08:19   Dg Chest Port 1 View  Result Date: 10/03/2016 CLINICAL DATA:  Pneumonia . EXAM: PORTABLE CHEST 1 VIEW COMPARISON:  09/30/2016. FINDINGS: Diffuse left lung infiltrate is again noted without significant interim change. Mild right base atelectasis. Calcified granuloma noted right lung base. Left pleural effusion most likely present. No pneumothorax. Cardiac contour is obscured by the diffuse left lung infiltrate. No acute bony abnormality. IMPRESSION: 1. Diffuse left lung infiltrate again noted consistent with pneumonia. Associated left pleural effusion most likely present. No interim change. 2.  Mild right base atelectasis. Electronically Signed   By: Marcello Moores  Register   On: 10/03/2016 07:19   Dg Chest Port 1 View  Result Date: 09/30/2016 CLINICAL DATA:  Congestive productive cough, sob, admitted for sepsis, tachycardia, hx multiple pneumonias since November of last year. EXAM: PORTABLE CHEST 1 VIEW COMPARISON:  Chest CT and chest radiographs, 09/27/2016. FINDINGS: There is consolidation throughout most of the left lung, sparing the left apex. Lung consolidation in the upper lobe on  the left has mildly increased from the prior exams. Right lung is hyperexpanded. Stable granuloma in the inferior right upper lobe. Right lung is otherwise clear. Cardiac silhouette is mostly obscured by the left lung consolidation. Cardiac silhouette is grossly normal in size. No pneumothorax. IMPRESSION: 1. Mild worsening of left lung aeration when compared to the prior exams. Consolidation has increased in the left upper lobe and persists throughout the remainder of the left lung sparing only the left apex. Electronically Signed   By: Lajean Manes M.D.   On: 09/30/2016 08:19   Dg C-arm Bronchoscopy  Result Date: 10/11/2016 C-ARM BRONCHOSCOPY: Fluoroscopy was utilized by the requesting physician.  No radiographic interpretation.   Discharge Exam: Vitals:   10/16/16 0847 10/16/16 0849  BP:  107/72  Pulse:  84  Resp:    Temp:    SpO2: 90% 94%   Vitals:   10/16/16 0429 10/16/16 0845 10/16/16 0847 10/16/16 0849  BP: (!) 92/59   107/72  Pulse: 66   84  Resp: 18     Temp: 98 F (36.7 C)     TempSrc: Oral     SpO2: 91% (!) 80% 90% 94%  Weight: 55.5 kg (122 lb 5.7 oz)     Height:        Physical Exam  Constitutional: Appears well-developed and well-nourished. No distress.  CVS: RRR, S1/S2 +, no murmurs, no gallops, no carotid bruit.  Pulmonary: still bloody tinged sputum, productive cough, course sounds on the left  Abdominal: Soft. BS +,  no distension, tenderness, rebound or guarding.  Musculoskeletal: Normal range of motion. No edema and no tenderness.  Lymphadenopathy: No lymphadenopathy noted, cervical, inguinal.   Discharge  Instructions   Allergies as of 10/16/2016   No Known Allergies     Medication List    STOP taking these medications   amoxicillin 500 MG capsule Commonly known as:  AMOXIL   levofloxacin 500 MG tablet Commonly known as:  LEVAQUIN   oseltamivir 30 MG capsule Commonly known as:  TAMIFLU     TAKE these medications   albuterol (2.5 MG/3ML)  0.083% nebulizer solution Commonly known as:  PROVENTIL Take 2.5 mg by nebulization every 6 (six) hours as needed for wheezing or shortness of breath.   aspirin 81 MG chewable tablet Chew by mouth daily.   budesonide-formoterol 160-4.5 MCG/ACT inhaler Commonly known as:  SYMBICORT Inhale 2 puffs into the lungs 2 (two) times daily.   diltiazem 180 MG 24 hr capsule Commonly known as:  DILACOR XR Take 180 mg by mouth daily.   furosemide 40 MG tablet Commonly known as:  LASIX Take 40 mg by mouth.   levalbuterol 1.25 MG/0.5ML nebulizer solution Commonly known as:  XOPENEX Take 1.25 mg by nebulization every 6 (six) hours as needed for wheezing or shortness of breath.   levalbuterol 1.25 MG/0.5ML nebulizer solution Commonly known as:  XOPENEX Take 1.25 mg by nebulization 3 (three) times daily.   ranitidine 150 MG tablet Commonly known as:  ZANTAC Take 150 mg by mouth 2 (two) times daily.   warfarin 5 MG tablet Commonly known as:  COUMADIN Take 5 mg by mouth daily.            Durable Medical Equipment        Start     Ordered   10/13/16 1500  For home use only DME oxygen  Once    Question Answer Comment  Mode or (Route) Nasal cannula   Liters per Minute 3   Frequency Continuous (stationary and portable oxygen unit needed)   Oxygen delivery system Gas      10/13/16 1500       Discharge Care Instructions        Start     Ordered   10/13/16 0000  levalbuterol (XOPENEX) 1.25 MG/0.5ML nebulizer solution  Every 6 hours PRN     10/13/16 1323   10/13/16 0000  levalbuterol (XOPENEX) 1.25 MG/0.5ML nebulizer solution  3 times daily     10/13/16 1323          The results of significant diagnostics from this hospitalization (including imaging, microbiology, ancillary and laboratory) are listed below for reference.     Microbiology: Recent Results (from the past 240 hour(s))  Fungus Culture With Stain     Status: None   Collection Time: 10/06/16  5:41 PM   Result Value Ref Range Status   Fungus Stain Final report  Final   Fungus (Mycology) Culture Preliminary report  Final    Comment: (NOTE) Performed At: [facility] [place], [place] [id number] Lindon Romp MD [id number]    Fungal Source EXPECTORATED SPUTUM  Final  Acid Fast Smear (AFB)     Status: None   Collection Time: 10/06/16  5:41 PM  Result Value Ref Range Status   AFB Specimen Processing Concentration  Final   Acid Fast Smear Negative  Final    Comment: (NOTE) Performed At: [facility] [place], [place] [id number] Lindon Romp MD [id number]    Source (AFB) EXPECTORATED SPUTUM  Final  Fungus Culture Result     Status: None   Collection Time: 10/06/16  5:41 PM  Result Value Ref Range Status   Result 1 Comment  Final    Comment: (NOTE) Fungal elements, such as arthroconidia, hyphal fragments, chlamydoconidia, observed. Performed At: [facility] [place], [place] [id number] Lindon Romp MD [id number]   Fungal organism reflex     Status: None   Collection Time: 10/06/16  5:41 PM  Result Value Ref Range Status   Fungal result 1 Candida albicans  Corrected    Comment: (NOTE) Light growth Reported pos yeast isolated to Earlyne Iba on 10-13-16, at 5:23pm. Faxed to [phone]. Performed At: [facility] [address] [place], [place] [id number] Lindon Romp MD [id number] CORRECTED ON 09/01 AT 1631: PREVIOUSLY REPORTED AS Comment   Culture, expectorated sputum-assessment     Status: None   Collection Time: 10/07/16 10:25 AM  Result Value Ref Range Status   Specimen Description SPUTUM  Final   Special Requests Normal  Final   Sputum evaluation   Final    Sputum specimen not acceptable for testing.  Please recollect.   NOTIFIED PAM RN [phone] [email or web address]    Report Status 10/09/2016 FINAL  Final  Culture, bal-quantitative     Status: Abnormal   Collection Time:  10/11/16  9:36 AM  Result Value Ref Range Status   Specimen Description BRONCHIAL ALVEOLAR LAVAGE  Final   Special Requests Normal  Final   Gram Stain   Final    FEW WBC PRESENT,BOTH PMN AND MONONUCLEAR RARE SQUAMOUS EPITHELIAL CELLS PRESENT RARE GRAM POSITIVE RODS    Culture (A)  Final    20,000 COLONIES/mL Consistent with normal respiratory flora. Performed at [facility], [place] [address] [place], [place] [address]    Report Status 10/13/2016 FINAL  Final  Acid Fast Smear (AFB)     Status: None   Collection Time: 10/11/16  9:50 AM  Result Value Ref Range Status   AFB Specimen Processing Concentration  Final   Acid Fast Smear Negative  Final    Comment: (NOTE) Performed At: [facility] [place], [place] [id number] Lindon Romp MD [id number]    Source (AFB) BRONCHIAL ALVEOLAR LAVAGE  Final  Fungus Culture With Stain     Status: None (Preliminary result)   Collection Time: 10/11/16  9:50 AM  Result Value Ref Range Status   Fungus Stain Final report  Final    Comment: (NOTE) Performed At: [facility] [place], [place] [id number] Lindon Romp MD [id number]    Fungus (Mycology) Culture PENDING  Incomplete   Fungal Source BRONCHIAL ALVEOLAR LAVAGE  Final  Fungus Culture Result     Status: None   Collection Time: 10/11/16  9:50 AM  Result Value Ref Range Status   Result 1 Comment  Final    Comment: (NOTE) KOH/Calcofluor preparation:  no fungus observed. Performed At: [facility] [place], [place] [id number] Lindon Romp MD YT:[id number]      Labs: Basic Metabolic Panel:  Recent Labs Lab 10/12/16 0349 10/13/16 0413 10/14/16 0520 10/15/16 0431 10/16/16 0444  NA 136 138 136 137 137  K 4.2 5.0 4.0 4.5 4.5  CL 101 102 101 101 100*  CO2 _0 GLUCOSE 188* 95 98 172* 103*  BUN _1 CREATININE 0.45 0.44 0.47 0.58 0.44  CALCIUM 9.0 9.1 9.1 9.1 9.3    CBC:  Recent Labs Lab 10/12/16 0349 10/13/16 0413 10/14/16 0520 10/15/16 0431 10/16/16  0444  WBC 12.1* 11.2* 10.0 11.3* 12.0*  HGB 11.4* 11.6* 11.7* 11.6* 12.6  HCT 36.0 36.8 37.0 36.1 39.0  MCV 85.7 86.8 85.6 84.7 85.7  PLT 353 338 367 343 386    BNP (last 3 results)  Recent Labs  09/27/16 1900  BNP 265.9*    SIGNED: Time coordinating discharge: 60 minutes  Faye Ramsay, MD  [facility] 10/16/2016, 11:06 AM Pager [phone]  If 7PM-7AM, please contact night-coverage [email or web address] Password [email or web address]

## 2016-10-16 NOTE — Progress Notes (Signed)
Spoke with pt via audio interpreting [phone], explained to pt over and over again that she was discharged, that her daughter will need to pick her up. Pt [place] that [place] and could not come until tomorrow. Pt stated that this CM will not make her a salve and she would go to the government to tell that this CM was putting her out. Explained to the pt that this is standard of care with [employer] and not because she is [place]. Pt asked for results of her test. Let pt know this CM will inform MD that pt is requesting result from her test. Pt wrote CM's name down and telephone number in her notebook. Pt gave daughter's cell number [phone], which was called. Daughter (Anzhelika Academic librarian) states that she was at the [facility] and did not know her mother was discharged. Daughter continued to say she would come pick her mother pt up tomorrow. Explained to daughter that pt was discharged today. Daughter states she will need 4 hours to drive back due to traffic.  Pt stated she was on vacation. This CM explained that her mother is discharged today, if she could not pick her up a Shelter list will be given to her mother. Pt asked if medications will be given to her. Explained that medications are not given to our patients, however the MD will give prescriptions to pt and the nurse will go over discharge with pt and daughter. Pt have home O2 from [facility]. There are no other needs at present time.

## 2016-10-16 NOTE — Progress Notes (Signed)
Physical Therapy Treatment Patient Details Name: Michelle Mccall MRN: [id number] DOB: 02/18/1940 Today's Date: 10/16/2016    History of Present Illness Pt admitted with dx sepsis 2* recurrent PNA    PT Comments    Pt progressing well; sats continue to drop on RA, pt is set up with home O2   Follow Up Recommendations  No PT follow up     Equipment Recommendations  None recommended by PT    Recommendations for Other Services       Precautions / Restrictions Precautions Precautions: Fall Precaution Comments: monitor sats/likes to wear shoes/speaks [place] Restrictions Weight Bearing Restrictions: No    Mobility  Bed Mobility Overal bed mobility: Modified Independent             General bed mobility comments: pt sitting EOB Indep on arrival  Transfers Overall transfer level: Needs assistance Equipment used: None Transfers: Sit to/from Stand Sit to Stand: Supervision;Modified independent (Device/Increase time)         General transfer comment: Supervision for environmental safety and O2 tubing.  Good use of hands to steady self  Ambulation/Gait Ambulation/Gait assistance: Supervision Ambulation Distance (Feet): 380 Feet Assistive device: None Gait Pattern/deviations: Step-through pattern;Decreased stride length;Trunk flexed Gait velocity: pt on 4L O2 in room   General Gait Details: amb without AD, no LOB, one sitting rest d/t fatigue and DOE;    Amb sats  on RA decreased to 87%.  Reapplied 3 lts to achieve 94% and visually instructed on purse lip breathing    Stairs            Wheelchair Mobility    Modified Rankin (Stroke Patients Only)       Balance                             High level balance activites: Backward walking;Direction changes;Turns;Sudden stops;Side stepping;Head turns              Cognition Arousal/Alertness: Awake/alert Behavior During Therapy: WFL for tasks assessed/performed Overall Cognitive Status:  Within Functional Limits for tasks assessed                                 General Comments: speaks some English      Exercises      General Comments        Pertinent Vitals/Pain Pain Assessment: No/denies pain    Home Living                      Prior Function            PT Goals (current goals can now be found in the care plan section) Acute Rehab PT Goals Patient Stated Goal: Home with dtr PT Goal Formulation: With patient Time For Goal Achievement: 10/20/16 Potential to Achieve Goals: Good Progress towards PT goals: Progressing toward goals    Frequency    Min 3X/week      PT Plan Current plan remains appropriate    Co-evaluation              AM-PAC PT "6 Clicks" Daily Activity  Outcome Measure  Difficulty turning over in bed (including adjusting bedclothes, sheets and blankets)?: None Difficulty moving from lying on back to sitting on the side of the bed? : None Difficulty sitting down on and standing up from a chair with arms (e.g., wheelchair, bedside commode, etc,.)?:  None Help needed moving to and from a bed to chair (including a wheelchair)?: None Help needed walking in hospital room?: A Little Help needed climbing 3-5 steps with a railing? : A Little 6 Click Score: 22    End of Session Equipment Utilized During Treatment: Gait belt;Oxygen Activity Tolerance: Patient tolerated treatment well Patient left: with call bell/phone within reach;Other (comment) (pt on EOB as she was on arrival)   PT Visit Diagnosis: Unsteadiness on feet (R26.81);Muscle weakness (generalized) (M62.81)     Time: 3491-7915 PT Time Calculation (min) (ACUTE ONLY): 23 min  Charges:  $Gait Training: 23-37 mins                    G Codes:          Michelle Mccall 10-25-16, 3:31 PM

## 2016-10-16 NOTE — Progress Notes (Signed)
[place] for Coumadin Indication: unspecified tachycardia  No Known Allergies  Patient Measurements: Height: 5\' 1"  (154.9 cm) Weight: 122 lb 5.7 oz (55.5 kg) IBW/kg (Calculated) : 47.8  Vital Signs: Temp: 98 F (36.7 C) (09/03 0429) Temp Source: Oral (09/03 0429) BP: 107/72 (09/03 0849) Pulse Rate: 84 (09/03 0849)  Labs:  Recent Labs  10/14/16 0520 10/15/16 0431 10/16/16 0444  HGB 11.7* 11.6* 12.6  HCT 37.0 36.1 39.0  PLT 367 343 386  LABPROT 20.0* 23.2* 25.1*  INR 1.72 2.07 2.30  CREATININE 0.47 0.58 0.44    Estimated Creatinine Clearance: 44.4 mL/min (by C-G formula based on SCr of 0.44 mg/dL).   Medical History: Past Medical History:  Diagnosis Date  . Hyperlipidemia   . Pneumonia   . Tachyarrhythmia     Medications:  Scheduled:  . acidophilus  1 capsule Oral Daily  . aspirin  81 mg Oral Daily  . diltiazem  180 mg Oral Daily  . docusate sodium  100 mg Oral Daily  . famotidine  20 mg Oral BID  . guaiFENesin  1,200 mg Oral BID  . levalbuterol  1.25 mg Nebulization TID  . mouth rinse  15 mL Mouth Rinse BID  . nystatin   Topical TID  . nystatin cream   Topical BID  . polyethylene glycol  17 g Oral Daily  . predniSONE  20 mg Oral Q breakfast  . senna  2 tablet Oral QHS  . Warfarin - Pharmacist Dosing Inpatient   Does not apply q1800   Infusions:  . sodium chloride 10 mL/hr at 10/13/16 2158   PRN: chlorpheniramine-HYDROcodone, levalbuterol, metoprolol tartrate, ondansetron (ZOFRAN) IV    Assessment: 76 yo female admitted with pneumonia. Her daughter reports that she is taking warfarin 5mg  daily for an abnormal heart rhythm but has not taken it in the past week. She was unable to say what physician monitors the INR.  INR is subtherapeutic at 0.92. Pharmacy is consulted to resume dosing now.  Significant events 8/16: Spoke with patient's daughter who states that she is on warfarin  8/18: Lovenox per pharmacy  until INR therapeutic 8/20 INR therapeutic, enoxaparin stopped 8/24: still no family available.  INR low now, don't need bridging with lovenox per d/w Dr. Sheran Fava 8/25: Warfarin changed to lovenox in case pt needs bronchoscopy soon 8/28: Lovenox d/c'ed due to plan for bronch tmw 8/29 resume warfarin. No need for Lovenox per Dr. 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

## 2016-10-16 NOTE — Progress Notes (Signed)
Several calls made to pt's daughter [phone], [phone], with no answer, VM left for daughter to call this CM back.

## 2016-10-17 LAB — PNEUMOCYSTIS JIROVECI SMEAR BY DFA: Pneumocystis jiroveci Ag: NEGATIVE

## 2016-10-23 ENCOUNTER — Telehealth: Payer: Self-pay | Admitting: *Deleted

## 2016-10-23 NOTE — Telephone Encounter (Signed)
Oncology Nurse Navigator Documentation  Oncology Nurse Navigator Flowsheets 10/23/2016  Referral date to RadOnc/MedOnc 10/23/2016  Navigator Encounter Type Telephone/I received referral on Ms. Farina today.  I called to schedule her to be seen this week with Dr. Julien Nordmann but was unable to reach.  I did leave vm message to call with my name and phone number.   Telephone Outgoing Call  Treatment Phase Pre-Tx/Tx Discussion  Barriers/Navigation Needs Coordination of Care  Interventions Coordination of Care  Coordination of Care Appts  Acuity Level 2  Acuity Level 2 Assistance expediting appointments  Time Spent with Patient 30

## 2016-10-24 ENCOUNTER — Telehealth: Payer: Self-pay | Admitting: *Deleted

## 2016-10-24 NOTE — Telephone Encounter (Signed)
Oncology Nurse Navigator Documentation  Oncology Nurse Navigator Flowsheets 10/24/2016  Navigator Encounter Type Telephone/I called to schedule Ms. Michelle Mccall.  I was unable to reach.  I did leave a vm message with my name and phone number to call.   Telephone Outgoing Call  Treatment Phase Pre-Tx/Tx Discussion  Barriers/Navigation Needs Coordination of Care  Interventions Coordination of Care  Coordination of Care Appts  Acuity Level 1  Time Spent with Patient 15

## 2016-10-25 ENCOUNTER — Telehealth: Payer: Self-pay | Admitting: Internal Medicine

## 2016-10-25 ENCOUNTER — Encounter: Payer: Self-pay | Admitting: Internal Medicine

## 2016-10-25 NOTE — Telephone Encounter (Signed)
Appt has been scheduled for the pt to see Dr. Julien Nordmann on 9/24 at 215pm, labs at 145pm. Letter mailed with the appt information.

## 2016-10-31 MED ORDER — WARFARIN SODIUM 5 MG PO TABS: 5.0000 mg | ORAL_TABLET | Freq: Every day | ORAL | 1 refills | Status: DC

## 2016-11-03 ENCOUNTER — Other Ambulatory Visit: Payer: Self-pay | Admitting: *Deleted

## 2016-11-03 DIAGNOSIS — R918 Other nonspecific abnormal finding of lung field: Secondary | ICD-10-CM

## 2016-11-06 ENCOUNTER — Ambulatory Visit: Payer: Self-pay | Admitting: Internal Medicine

## 2016-11-06 ENCOUNTER — Other Ambulatory Visit: Payer: Self-pay

## 2016-11-07 LAB — FUNGUS CULTURE WITH STAIN

## 2016-11-07 LAB — FUNGUS CULTURE RESULT

## 2016-11-07 LAB — FUNGAL ORGANISM REFLEX

## 2016-11-09 LAB — FUNGUS CULTURE WITH STAIN

## 2016-11-09 LAB — FUNGAL ORGANISM REFLEX

## 2016-11-09 LAB — FUNGUS CULTURE RESULT

## 2016-11-21 LAB — ACID FAST CULTURE WITH REFLEXED SENSITIVITIES: ACID FAST CULTURE - AFSCU3: NEGATIVE

## 2016-11-24 LAB — ACID FAST CULTURE WITH REFLEXED SENSITIVITIES: ACID FAST CULTURE - AFSCU3: NEGATIVE

## 2017-04-02 ENCOUNTER — Emergency Department (HOSPITAL_COMMUNITY): Payer: Medicaid Other

## 2017-04-02 ENCOUNTER — Inpatient Hospital Stay (HOSPITAL_COMMUNITY)
Admission: EM | Admit: 2017-04-02 | Discharge: 2017-04-10 | DRG: 871 | Disposition: A | Discharge status: Home / Self Care | Payer: Medicaid Other | Attending: Internal Medicine | Admitting: Internal Medicine

## 2017-04-02 ENCOUNTER — Inpatient Hospital Stay (HOSPITAL_COMMUNITY): Payer: Medicaid Other

## 2017-04-02 ENCOUNTER — Encounter (HOSPITAL_COMMUNITY): Payer: Self-pay | Admitting: Emergency Medicine

## 2017-04-02 DIAGNOSIS — R000 Tachycardia, unspecified: Secondary | ICD-10-CM | POA: Diagnosis present

## 2017-04-02 DIAGNOSIS — J189 Pneumonia, unspecified organism: Secondary | ICD-10-CM

## 2017-04-02 DIAGNOSIS — E871 Hypo-osmolality and hyponatremia: Secondary | ICD-10-CM | POA: Diagnosis present

## 2017-04-02 DIAGNOSIS — J9621 Acute and chronic respiratory failure with hypoxia: Secondary | ICD-10-CM | POA: Diagnosis present

## 2017-04-02 DIAGNOSIS — C3490 Malignant neoplasm of unspecified part of unspecified bronchus or lung: Secondary | ICD-10-CM | POA: Diagnosis present

## 2017-04-02 DIAGNOSIS — E7800 Pure hypercholesterolemia, unspecified: Secondary | ICD-10-CM | POA: Diagnosis present

## 2017-04-02 DIAGNOSIS — J90 Pleural effusion, not elsewhere classified: Secondary | ICD-10-CM

## 2017-04-02 DIAGNOSIS — R918 Other nonspecific abnormal finding of lung field: Secondary | ICD-10-CM | POA: Diagnosis present

## 2017-04-02 DIAGNOSIS — E860 Dehydration: Secondary | ICD-10-CM | POA: Diagnosis present

## 2017-04-02 DIAGNOSIS — Z66 Do not resuscitate: Secondary | ICD-10-CM | POA: Diagnosis not present

## 2017-04-02 DIAGNOSIS — A419 Sepsis, unspecified organism: Secondary | ICD-10-CM | POA: Diagnosis present

## 2017-04-02 DIAGNOSIS — R0602 Shortness of breath: Secondary | ICD-10-CM

## 2017-04-02 DIAGNOSIS — E875 Hyperkalemia: Secondary | ICD-10-CM

## 2017-04-02 DIAGNOSIS — L89151 Pressure ulcer of sacral region, stage 1: Secondary | ICD-10-CM | POA: Diagnosis present

## 2017-04-02 DIAGNOSIS — Z9981 Dependence on supplemental oxygen: Secondary | ICD-10-CM

## 2017-04-02 DIAGNOSIS — I482 Chronic atrial fibrillation: Secondary | ICD-10-CM | POA: Diagnosis present

## 2017-04-02 DIAGNOSIS — D649 Anemia, unspecified: Secondary | ICD-10-CM | POA: Diagnosis present

## 2017-04-02 DIAGNOSIS — Z515 Encounter for palliative care: Secondary | ICD-10-CM | POA: Diagnosis not present

## 2017-04-02 DIAGNOSIS — R6510 Systemic inflammatory response syndrome (SIRS) of non-infectious origin without acute organ dysfunction: Secondary | ICD-10-CM

## 2017-04-02 DIAGNOSIS — E119 Type 2 diabetes mellitus without complications: Secondary | ICD-10-CM | POA: Diagnosis present

## 2017-04-02 DIAGNOSIS — J9601 Acute respiratory failure with hypoxia: Secondary | ICD-10-CM | POA: Diagnosis present

## 2017-04-02 DIAGNOSIS — Z888 Allergy status to other drugs, medicaments and biological substances status: Secondary | ICD-10-CM

## 2017-04-02 DIAGNOSIS — L8990 Pressure ulcer of unspecified site, unspecified stage: Secondary | ICD-10-CM

## 2017-04-02 DIAGNOSIS — J811 Chronic pulmonary edema: Secondary | ICD-10-CM

## 2017-04-02 HISTORY — DX: Pure hypercholesterolemia, unspecified: E78.00

## 2017-04-02 HISTORY — DX: Type 2 diabetes mellitus without complications: E11.9

## 2017-04-02 HISTORY — DX: Chronic atrial fibrillation, unspecified: I48.20

## 2017-04-02 HISTORY — DX: Unspecified chronic bronchitis: J42

## 2017-04-02 LAB — CBC WITH DIFFERENTIAL/PLATELET
Basophils Absolute: 0 10*3/uL (ref 0.0–0.1)
Basophils Relative: 0 %
Eosinophils Absolute: 0 10*3/uL (ref 0.0–0.7)
Eosinophils Relative: 0 %
HCT: 38.7 % (ref 36.0–46.0)
Hemoglobin: 13.2 g/dL (ref 12.0–15.0)
Lymphocytes Relative: 10 %
Lymphs Abs: 1.3 10*3/uL (ref 0.7–4.0)
MCH: 29.7 pg (ref 26.0–34.0)
MCHC: 34.1 g/dL (ref 30.0–36.0)
MCV: 87 fL (ref 78.0–100.0)
Monocytes Absolute: 0.6 10*3/uL (ref 0.1–1.0)
Monocytes Relative: 5 %
Neutro Abs: 10.7 10*3/uL — ABNORMAL HIGH (ref 1.7–7.7)
Neutrophils Relative %: 85 %
Platelets: 343 10*3/uL (ref 150–400)
RBC: 4.45 MIL/uL (ref 3.87–5.11)
RDW: 14.5 % (ref 11.5–15.5)
WBC: 12.7 10*3/uL — ABNORMAL HIGH (ref 4.0–10.5)

## 2017-04-02 LAB — COMPREHENSIVE METABOLIC PANEL
ALT: 15 U/L (ref 14–54)
AST: 25 U/L (ref 15–41)
Albumin: 3.2 g/dL — ABNORMAL LOW (ref 3.5–5.0)
Alkaline Phosphatase: 62 U/L (ref 38–126)
Anion gap: 11 (ref 5–15)
BUN: 25 mg/dL — ABNORMAL HIGH (ref 6–20)
CO2: 26 mmol/L (ref 22–32)
Calcium: 9.4 mg/dL (ref 8.9–10.3)
Chloride: 90 mmol/L — ABNORMAL LOW (ref 101–111)
Creatinine, Ser: 0.67 mg/dL (ref 0.44–1.00)
GFR calc Af Amer: >60 mL/min (ref >60)
GFR calc non Af Amer: >60 mL/min (ref >60)
Glucose, Bld: 152 mg/dL — ABNORMAL HIGH (ref 65–99)
Potassium: 5.6 mmol/L — ABNORMAL HIGH (ref 3.5–5.1)
Sodium: 127 mmol/L — ABNORMAL LOW (ref 135–145)
Total Bilirubin: 0.6 mg/dL (ref 0.3–1.2)
Total Protein: 7.9 g/dL (ref 6.5–8.1)

## 2017-04-02 LAB — BRAIN NATRIURETIC PEPTIDE: B NATRIURETIC PEPTIDE 5: 674.2 pg/mL — AB (ref 0.0–100.0)

## 2017-04-02 LAB — INFLUENZA PANEL BY PCR (TYPE A & B)
INFLBPCR: NEGATIVE
Influenza A By PCR: NEGATIVE

## 2017-04-02 LAB — PROTIME-INR
INR: 0.91
Prothrombin Time: 12.2 seconds (ref 11.4–15.2)

## 2017-04-02 LAB — I-STAT CG4 LACTIC ACID, ED: Lactic Acid, Venous: 1.6 mmol/L (ref 0.5–1.9)

## 2017-04-02 MED ORDER — SODIUM CHLORIDE 0.9 % IV BOLUS (SEPSIS): 1500.0000 mL | Freq: Once | INTRAVENOUS | Status: DC

## 2017-04-02 MED ORDER — AZITHROMYCIN 250 MG PO TABS
500.0000 mg | ORAL_TABLET | ORAL | Status: DC
  Administered 2017-04-03 – 2017-04-05 (×3): 500 mg via ORAL
  Filled 2017-04-02 (×3): qty 2

## 2017-04-02 MED ORDER — INSULIN ASPART 100 UNIT/ML ~~LOC~~ SOLN
10.0000 [IU] | Freq: Once | SUBCUTANEOUS | Status: AC
  Administered 2017-04-02: 10 [IU] via INTRAVENOUS
  Filled 2017-04-02: qty 1

## 2017-04-02 MED ORDER — SODIUM CHLORIDE 0.9 % IV SOLN
2.0000 g | Freq: Once | INTRAVENOUS | Status: AC
  Administered 2017-04-02: 2 g via INTRAVENOUS
  Filled 2017-04-02: qty 20

## 2017-04-02 MED ORDER — INSULIN ASPART 100 UNIT/ML ~~LOC~~ SOLN
0.0000 [IU] | Freq: Three times a day (TID) | SUBCUTANEOUS | Status: DC
  Administered 2017-04-03 – 2017-04-04 (×2): 1 [IU] via SUBCUTANEOUS
  Administered 2017-04-04: 2 [IU] via SUBCUTANEOUS
  Administered 2017-04-05 – 2017-04-06 (×2): 1 [IU] via SUBCUTANEOUS
  Administered 2017-04-06: 2 [IU] via SUBCUTANEOUS
  Administered 2017-04-06 – 2017-04-07 (×2): 1 [IU] via SUBCUTANEOUS
  Administered 2017-04-07: 3 [IU] via SUBCUTANEOUS
  Administered 2017-04-07: 1 [IU] via SUBCUTANEOUS
  Administered 2017-04-08: 3 [IU] via SUBCUTANEOUS
  Administered 2017-04-08 – 2017-04-10 (×6): 1 [IU] via SUBCUTANEOUS

## 2017-04-02 MED ORDER — SODIUM BICARBONATE 8.4 % IV SOLN
50.0000 meq | Freq: Once | INTRAVENOUS | Status: AC
  Administered 2017-04-02: 50 meq via INTRAVENOUS
  Filled 2017-04-02: qty 50

## 2017-04-02 MED ORDER — SODIUM CHLORIDE 0.9 % IV SOLN
500.0000 mg | Freq: Once | INTRAVENOUS | Status: AC
  Administered 2017-04-02: 500 mg via INTRAVENOUS
  Filled 2017-04-02: qty 500

## 2017-04-02 MED ORDER — ENOXAPARIN SODIUM 40 MG/0.4ML ~~LOC~~ SOLN
40.0000 mg | SUBCUTANEOUS | Status: DC
  Administered 2017-04-03 – 2017-04-09 (×8): 40 mg via SUBCUTANEOUS
  Filled 2017-04-02 (×9): qty 0.4

## 2017-04-02 MED ORDER — SODIUM CHLORIDE 0.9 % IV SOLN
1.0000 g | INTRAVENOUS | Status: DC
  Administered 2017-04-03 – 2017-04-05 (×3): 1 g via INTRAVENOUS
  Filled 2017-04-02 (×3): qty 1
  Filled 2017-04-02: qty 10

## 2017-04-02 MED ORDER — SODIUM CHLORIDE 0.9 % IV BOLUS (SEPSIS)
500.0000 mL | Freq: Once | INTRAVENOUS | Status: AC
  Administered 2017-04-02: 500 mL via INTRAVENOUS

## 2017-04-02 MED ORDER — ALBUTEROL SULFATE (2.5 MG/3ML) 0.083% IN NEBU
5.0000 mg | INHALATION_SOLUTION | Freq: Once | RESPIRATORY_TRACT | Status: AC
  Administered 2017-04-02: 5 mg via RESPIRATORY_TRACT
  Filled 2017-04-02: qty 6

## 2017-04-02 MED ORDER — SODIUM CHLORIDE 0.9 % IV SOLN
1.0000 g | Freq: Once | INTRAVENOUS | Status: AC
  Administered 2017-04-02: 1 g via INTRAVENOUS
  Filled 2017-04-02: qty 10

## 2017-04-02 MED ORDER — DEXTROSE 50 % IV SOLN
50.0000 mL | Freq: Once | INTRAVENOUS | Status: AC
  Administered 2017-04-02: 50 mL via INTRAVENOUS
  Filled 2017-04-02: qty 50

## 2017-04-02 NOTE — ED Notes (Signed)
ED Provider at bedside. 

## 2017-04-02 NOTE — ED Notes (Signed)
Placed pt on 3L [place] (she was on 2 and family states that she is on 3L at home).  Pt states that she is not feeling that well.  No acute distress.  Placed pt on bedpan and asked her to try and get a urine sample

## 2017-04-02 NOTE — ED Notes (Signed)
Advised family members to take valuables, especially important documents (visa, passport) and any other valuables. Family states that they will take home.

## 2017-04-02 NOTE — ED Notes (Signed)
Patient transported to CT 

## 2017-04-02 NOTE — ED Triage Notes (Signed)
Per interpreter, patient sent from PCP today, reports she was sent for evaluation after diagnosis of asthma and bronchitis. Patient is poor historian. Hx pneumonia. Patient reports productive cough and SOB.

## 2017-04-02 NOTE — ED Provider Notes (Signed)
[facility] DEPT Provider Note   CSN: [id number] Arrival date & time: 04/02/17  1703     History   Chief Complaint Chief Complaint  Patient presents with  . Cough  . Shortness of Breath    HPI Michelle Mccall is a 77 y.o. female.  HPI   77 year old female with cough and dyspnea.  [place] speaking.  History is from her daughter at bedside and also clinic note from earlier today.  They are declining translating service.  Apparently she has been having waxing and waning respiratory symptoms for several months.  Has had several different courses of antibiotics.  Note also mentions steroids, nebs and she is also on 3 L of supplemental oxygen at home.  She went to [facility] primary care office today and was referred to the emergency room.  Apparently the provider there had seen her previously he did not like the way she looked.  She was given 1 g of Rocephin IM and 60 mg of Depo-Medrol.  Subjective fever.  Cough is productive.  Some chest soreness when she coughs.  Denies any acute pain complaints otherwise.  No unusual leg pain or swelling.  Past Medical History:  Diagnosis Date  . High cholesterol     There are no active problems to display for this patient.   Past Surgical History:  Procedure Laterality Date  . BACK SURGERY    . ECTOPIC PREGNANCY SURGERY      OB History    No data available       Home Medications    Prior to Admission medications   Not on File    Family History No family history on file.  Social History Social History   Tobacco Use  . Smoking status: Not on file  Substance Use Topics  . Alcohol use: Not on file  . Drug use: Not on file     Allergies   Prednisone   Review of Systems Review of Systems  All systems reviewed and negative, other than as noted in HPI.  Physical Exam Updated Vital Signs BP 112/89 (BP Location: Right Arm)   Pulse (!) 146   Temp 98.1 F (36.7 C)  (Oral)   Resp (!) 34   Ht 4\' 11"  (1.499 m)   Wt 52.6 kg (116 lb)   SpO2 96%   BMI 23.43 kg/m   Physical Exam  Constitutional: She appears well-developed and well-nourished. No distress.  HENT:  Head: Normocephalic and atraumatic.  Eyes: Conjunctivae are normal. Right eye exhibits no discharge. Left eye exhibits no discharge.  Neck: Neck supple.  Cardiovascular: Regular rhythm and normal heart sounds. Exam reveals no gallop and no friction rub.  No murmur heard. tachy  Pulmonary/Chest: Effort normal. No respiratory distress. She has rales.  Abdominal: Soft. She exhibits no distension. There is no tenderness.  Musculoskeletal: She exhibits no edema or tenderness.  Neurological: She is alert.  Skin: Skin is warm and dry.  Psychiatric: She has a normal mood and affect. Her behavior is normal. Thought content normal.  Nursing note and vitals reviewed.    ED Treatments / Results  Labs (all labs ordered are listed, but only abnormal results are displayed) Labs Reviewed  COMPREHENSIVE METABOLIC PANEL - Abnormal; Notable for the following components:      Result Value   Sodium 127 (*)    Potassium 5.6 (*)    Chloride 90 (*)    Glucose, Bld 152 (*)    BUN 25 (*)  Albumin 3.2 (*)    All other components within normal limits  CBC WITH DIFFERENTIAL/PLATELET - Abnormal; Notable for the following components:   WBC 12.7 (*)    Neutro Abs 10.7 (*)    All other components within normal limits  URINALYSIS, ROUTINE W REFLEX MICROSCOPIC - Abnormal; Notable for the following components:   Glucose, UA 150 (*)    Hgb urine dipstick SMALL (*)    Ketones, ur 5 (*)    Leukocytes, UA MODERATE (*)    Bacteria, UA RARE (*)    Squamous Epithelial / LPF 0-5 (*)    All other components within normal limits  BASIC METABOLIC PANEL - Abnormal; Notable for the following components:   Sodium 130 (*)    Chloride 93 (*)    Glucose, Bld 59 (*)    BUN 21 (*)    Calcium 8.7 (*)    All other  components within normal limits  CBC - Abnormal; Notable for the following components:   WBC 12.5 (*)    RBC 3.79 (*)    Hemoglobin 11.1 (*)    HCT 33.3 (*)    All other components within normal limits  BRAIN NATRIURETIC PEPTIDE - Abnormal; Notable for the following components:   B Natriuretic Peptide 674.2 (*)    All other components within normal limits  GLUCOSE, CAPILLARY - Abnormal; Notable for the following components:   Glucose-Capillary 138 (*)    All other components within normal limits  GLUCOSE, CAPILLARY - Abnormal; Notable for the following components:   Glucose-Capillary 118 (*)    All other components within normal limits  CBG MONITORING, ED - Abnormal; Notable for the following components:   Glucose-Capillary 118 (*)    All other components within normal limits  CBG MONITORING, ED - Abnormal; Notable for the following components:   Glucose-Capillary 104 (*)    All other components within normal limits  CULTURE, BLOOD (ROUTINE X 2)  CULTURE, EXPECTORATED SPUTUM-ASSESSMENT  CULTURE, RESPIRATORY (NON-EXPECTORATED)  PROTIME-INR  INFLUENZA PANEL BY PCR (TYPE A & B)  STREP PNEUMONIAE URINARY ANTIGEN  PROCALCITONIN  SODIUM, URINE, RANDOM  OSMOLALITY, URINE  HIV ANTIBODY (ROUTINE TESTING)  I-STAT CG4 LACTIC ACID, ED  I-STAT CG4 LACTIC ACID, ED    EKG  EKG Interpretation  Date/Time:  Monday April 02 2017 17:49:54 EST Ventricular Rate:  106 PR Interval:    QRS Duration: 83 QT Interval:  303 QTC Calculation: 403 R Axis:   106 Text Interpretation:  Sinus tachycardia Right atrial enlargement Probable RVH w/ secondary repol abnormality Confirmed by Virgel Manifold [phone]) on 04/02/2017 7:57:46 PM       Radiology Dg Chest 2 View  Result Date: 04/02/2017 CLINICAL DATA:  Cough EXAM: CHEST  2 VIEW COMPARISON:  None. FINDINGS: Normal heart size. Normal mediastinal contour. No pneumothorax. Severe patchy consolidation throughout the left lung and at the right lung  base. Possible small bilateral pleural effusions. IMPRESSION: Severe patchy consolidation throughout the left greater than right lungs, most compatible with multilobar pneumonia. Possible small bilateral pleural effusions. Short-term follow-up chest imaging advised. Electronically Signed   By: Ilona Sorrel M.D.   On: 04/02/2017 19:30    Procedures Procedures (including critical care time)  Medications Ordered in ED Medications  azithromycin (ZITHROMAX) 500 mg in sodium chloride 0.9 % 250 mL IVPB (500 mg Intravenous New Bag/Given 04/02/17 2049)  sodium bicarbonate injection 50 mEq (not administered)  calcium gluconate 1 g in sodium chloride 0.9 % 100 mL IVPB (not administered)  dextrose 50 % solution 50 mL (not administered)  insulin aspart (novoLOG) injection 10 Units (not administered)  sodium chloride 0.9 % bolus 1,500 mL (not administered)  albuterol (PROVENTIL) (2.5 MG/3ML) 0.083% nebulizer solution 5 mg (5 mg Nebulization Given 04/02/17 2008)  cefTRIAXone (ROCEPHIN) 2 g in sodium chloride 0.9 % 100 mL IVPB (2 g Intravenous New Bag/Given 04/02/17 2008)     Initial Impression / Assessment and Plan / ED Course  I have reviewed the triage vital signs and the nursing notes.  Pertinent labs & imaging results that were available during my care of the patient were reviewed by me and considered in my medical decision making (see chart for details).    77 year old female with cough and dyspnea.  Chest x-ray concerning for multifocal pneumonia.  She did have intermittent periods of tachycardia when I was in the room.  Records sent with her does mention chronic atrial fibrillation although rhythm appears to be sinus up into the 130s when I was in the room.  Admission.   Final Clinical Impressions(s) / ED Diagnoses   Final diagnoses:  Community acquired pneumonia, unspecified laterality  Hyperkalemia    ED Discharge Orders    None       Virgel Manifold, MD 04/03/17 1620

## 2017-04-02 NOTE — ED Notes (Signed)
Pt is frequently cold.  She would like door closed to avoid draft.  She likes warm soup to eat and warm/hot tea.  No iced beverages.

## 2017-04-02 NOTE — ED Notes (Signed)
Pt daughter Michelle Mccall (cell: [phone]) can be reached or [phone] (home phone)  in case she is needed, she also speaks english fluently

## 2017-04-02 NOTE — H&P (Signed)
History and Physical    Michelle Mccall CNO:[id number] DOB: 17-Sep-1940 DOA: 04/02/2017  PCP: Patient, No Pcp Per  Patient coming from: Home  I have personally briefly reviewed patient's old medical records in [place]  Chief Complaint: Cough, SOB  HPI: Michelle Mccall is a 77 y.o. female with medical history significant of chronic bronchitis, reported chronic a-fib.  [place] speaking.  History is from daughter and office note from [facility].   Apparently she has been having waxing and waning respiratory symptoms for several months.  Has had several different courses of antibiotics.  Note also mentions steroids, nebs and she is also on 3 L of supplemental oxygen at home.  She went to [facility] primary care office today and was referred to the emergency room.   ED Course: Tachycardic from 100-140, sinus tach, apparently has been intermittent.  CXR shows B infiltrates suggestive of PNA.  Note from Monia Pouch suggests however that infiltrates have been present on CXRs since 05/2016.  Lactates nl at 1.6.  WBC 12k.  No fever, RR 30.  Given rocephin and azithromycin in ED.   Review of Systems: As per HPI otherwise 10 point review of systems negative.   Past Medical History:  Diagnosis Date  . Chronic a-fib ([place])   . Chronic bronchitis ([place])   . DM2 (diabetes mellitus, type 2) ([place])   . High cholesterol     Past Surgical History:  Procedure Laterality Date  . BACK SURGERY    . ECTOPIC PREGNANCY SURGERY       reports that  has never smoked. She does not have any smokeless tobacco history on file. She reports that she does not drink alcohol or use drugs.  Allergies  Allergen Reactions  . Prednisone     Family History  Family history unknown: Yes     Prior to Admission medications   Not on File    Physical Exam: Vitals:   04/02/17 1955 04/02/17 1958 04/02/17 2000 04/02/17 2100  BP: 112/89  99/79 90/72    Pulse: (!) 146     Resp:   (!) 36 (!) 29  Temp: 98.1 F (36.7 C)     TempSrc: Oral     SpO2: 96%     Weight:  52.6 kg (116 lb)    Height:  4\' 11"  (1.499 m)      Constitutional: NAD, calm, comfortable Eyes: PERRL, lids and conjunctivae normal ENMT: Mucous membranes are moist. Posterior pharynx clear of any exudate or lesions.Normal dentition.  Neck: normal, supple, no masses, no thyromegaly Respiratory: bilateral crackles, wet cough Cardiovascular: Tachycardic, Trace BLE edema. Abdomen: no tenderness, no masses palpated. No hepatosplenomegaly. Bowel sounds positive.  Musculoskeletal: no clubbing / cyanosis. No joint deformity upper and lower extremities. Good ROM, no contractures. Normal muscle tone.  Skin: no rashes, lesions, ulcers. No induration Neurologic: CN 2-12 grossly intact. Sensation intact, DTR normal. Strength 5/5 in all 4.  Psychiatric: Normal judgment and insight. Alert and oriented x 3. Normal mood.    Labs on Admission: I have personally reviewed following labs and imaging studies  CBC: Recent Labs  Lab 04/02/17 1838  WBC 12.7*  NEUTROABS 10.7*  HGB 13.2  HCT 38.7  MCV 87.0  PLT 294   Basic Metabolic Panel: Recent Labs  Lab 04/02/17 1838  NA 127*  K 5.6*  CL 90*  CO2 26  GLUCOSE 152*  BUN 25*  CREATININE 0.67  CALCIUM 9.4   GFR: Estimated  Creatinine Clearance: 43.7 mL/min (by C-G formula based on SCr of 0.67 mg/dL). Liver Function Tests: Recent Labs  Lab 04/02/17 1838  AST 25  ALT 15  ALKPHOS 62  BILITOT 0.6  PROT 7.9  ALBUMIN 3.2*   No results for input(s): LIPASE, AMYLASE in the last 168 hours. No results for input(s): AMMONIA in the last 168 hours. Coagulation Profile: Recent Labs  Lab 04/02/17 1838  INR 0.91   Cardiac Enzymes: No results for input(s): CKTOTAL, CKMB, CKMBINDEX, TROPONINI in the last 168 hours. BNP (last 3 results) No results for input(s): PROBNP in the last 8760 hours. HbA1C: No results for input(s):  HGBA1C in the last 72 hours. CBG: No results for input(s): GLUCAP in the last 168 hours. Lipid Profile: No results for input(s): CHOL, HDL, LDLCALC, TRIG, CHOLHDL, LDLDIRECT in the last 72 hours. Thyroid Function Tests: No results for input(s): TSH, T4TOTAL, FREET4, T3FREE, THYROIDAB in the last 72 hours. Anemia Panel: No results for input(s): VITAMINB12, FOLATE, FERRITIN, TIBC, IRON, RETICCTPCT in the last 72 hours. Urine analysis: No results found for: COLORURINE, APPEARANCEUR, LABSPEC, [place], GLUCOSEU, HGBUR, BILIRUBINUR, KETONESUR, PROTEINUR, UROBILINOGEN, NITRITE, LEUKOCYTESUR  Radiological Exams on Admission: Dg Chest 2 View  Result Date: 04/02/2017 CLINICAL DATA:  Cough EXAM: CHEST  2 VIEW COMPARISON:  None. FINDINGS: Normal heart size. Normal mediastinal contour. No pneumothorax. Severe patchy consolidation throughout the left lung and at the right lung base. Possible small bilateral pleural effusions. IMPRESSION: Severe patchy consolidation throughout the left greater than right lungs, most compatible with multilobar pneumonia. Possible small bilateral pleural effusions. Short-term follow-up chest imaging advised. Electronically Signed   By: Ilona Sorrel M.D.   On: 04/02/2017 19:30    EKG: Independently reviewed.  Assessment/Plan Principal Problem:   Acute on chronic respiratory failure with hypoxia (HCC) Active Problems:   Sepsis ([place])   Multifocal pneumonia   Bilateral pulmonary infiltrates on chest x-ray   Sinus tachycardia    1. Acute on chronic resp failure with hypoxia - multifocal infiltrates on CXR.   1. PNA vs other. 2. O2 via [place] 2. Multifocal infiltrates on CXR - apparently has had infiltrates dating back to 05/2016.  Recurrent PNA vs other. 1. Will treat as CAP for the moment 2. Rocephin / Zithromax 3. Cultures pending 4. PNA pathway 5. CT chest pending 6. 2D echo pending 7. BNP pending 8. Influenza pending 9. Pulm consult in AM most likely,  3. S.Tach  - 1. Sepsis vs sick sinus syndrome / rate mediated CHF, patient not in A.Fib right now 2. 500cc bolus to see how she responds 3. 2d echo 4. Tele monitor 4. ? DM2 - 1. Sensitive SSI AC for now 5. Hyponatremia - 1. 500cc bolus for now 2. Repeat BMP in AM 3. Unclear if hyper, iso, or hypovolemic at the moment  DVT prophylaxis: Lovenox Code Status: Full Family Communication: No family in room Disposition Plan: Home after admit Consults called: None Admission status: Admit to inpatient   [place], [place] [facility] Pager [phone]  If 7AM-7PM, please contact day team taking care of patient [email or web address] Password [email or web address]  04/02/2017, 9:47 PM

## 2017-04-02 NOTE — Progress Notes (Signed)
Attempted to reach her daughter on her cell phone but there was no answer to complete nursing admission hx. Lucius Conn BSN, RN-BC Admissions RN 04/02/2017 9:09 PM

## 2017-04-03 ENCOUNTER — Inpatient Hospital Stay (HOSPITAL_COMMUNITY): Payer: Medicaid Other

## 2017-04-03 ENCOUNTER — Encounter (HOSPITAL_COMMUNITY): Payer: Self-pay | Admitting: *Deleted

## 2017-04-03 ENCOUNTER — Other Ambulatory Visit: Payer: Self-pay

## 2017-04-03 DIAGNOSIS — R05 Cough: Secondary | ICD-10-CM

## 2017-04-03 DIAGNOSIS — J9621 Acute and chronic respiratory failure with hypoxia: Secondary | ICD-10-CM

## 2017-04-03 DIAGNOSIS — R000 Tachycardia, unspecified: Secondary | ICD-10-CM

## 2017-04-03 DIAGNOSIS — R634 Abnormal weight loss: Secondary | ICD-10-CM

## 2017-04-03 DIAGNOSIS — R6510 Systemic inflammatory response syndrome (SIRS) of non-infectious origin without acute organ dysfunction: Secondary | ICD-10-CM

## 2017-04-03 DIAGNOSIS — I361 Nonrheumatic tricuspid (valve) insufficiency: Secondary | ICD-10-CM

## 2017-04-03 DIAGNOSIS — R918 Other nonspecific abnormal finding of lung field: Secondary | ICD-10-CM

## 2017-04-03 DIAGNOSIS — J9601 Acute respiratory failure with hypoxia: Secondary | ICD-10-CM

## 2017-04-03 DIAGNOSIS — E119 Type 2 diabetes mellitus without complications: Secondary | ICD-10-CM

## 2017-04-03 DIAGNOSIS — C3490 Malignant neoplasm of unspecified part of unspecified bronchus or lung: Secondary | ICD-10-CM

## 2017-04-03 DIAGNOSIS — R0602 Shortness of breath: Secondary | ICD-10-CM

## 2017-04-03 DIAGNOSIS — J42 Unspecified chronic bronchitis: Secondary | ICD-10-CM

## 2017-04-03 LAB — URINALYSIS, ROUTINE W REFLEX MICROSCOPIC
Bilirubin Urine: NEGATIVE
Glucose, UA: 150 mg/dL — AB
Ketones, ur: 5 mg/dL — AB
Nitrite: NEGATIVE
Protein, ur: NEGATIVE mg/dL
Specific Gravity, Urine: 1.02 (ref 1.005–1.030)
pH: 5 (ref 5.0–8.0)

## 2017-04-03 LAB — CBC
HEMATOCRIT: 33.3 % — AB (ref 36.0–46.0)
Hemoglobin: 11.1 g/dL — ABNORMAL LOW (ref 12.0–15.0)
MCH: 29.3 pg (ref 26.0–34.0)
MCHC: 33.3 g/dL (ref 30.0–36.0)
MCV: 87.9 fL (ref 78.0–100.0)
Platelets: 317 10*3/uL (ref 150–400)
RBC: 3.79 MIL/uL — AB (ref 3.87–5.11)
RDW: 14.9 % (ref 11.5–15.5)
WBC: 12.5 10*3/uL — AB (ref 4.0–10.5)

## 2017-04-03 LAB — ECHOCARDIOGRAM COMPLETE
Height: 59 in
Weight: 1633.17 oz

## 2017-04-03 LAB — CBG MONITORING, ED
GLUCOSE-CAPILLARY: 104 mg/dL — AB (ref 65–99)
Glucose-Capillary: 118 mg/dL — ABNORMAL HIGH (ref 65–99)

## 2017-04-03 LAB — GLUCOSE, CAPILLARY
GLUCOSE-CAPILLARY: 138 mg/dL — AB (ref 65–99)
GLUCOSE-CAPILLARY: 118 mg/dL — AB (ref 65–99)
Glucose-Capillary: 142 mg/dL — ABNORMAL HIGH (ref 65–99)

## 2017-04-03 LAB — OSMOLALITY, URINE: Osmolality, Ur: 552 mOsm/kg (ref 300–900)

## 2017-04-03 LAB — EXPECTORATED SPUTUM ASSESSMENT W GRAM STAIN, RFLX TO RESP C

## 2017-04-03 LAB — BASIC METABOLIC PANEL
Anion gap: 11 (ref 5–15)
BUN: 21 mg/dL — ABNORMAL HIGH (ref 6–20)
CHLORIDE: 93 mmol/L — AB (ref 101–111)
CO2: 26 mmol/L (ref 22–32)
Calcium: 8.7 mg/dL — ABNORMAL LOW (ref 8.9–10.3)
Creatinine, Ser: 0.58 mg/dL (ref 0.44–1.00)
GFR calc non Af Amer: >60 mL/min (ref >60)
Glucose, Bld: 59 mg/dL — ABNORMAL LOW (ref 65–99)
POTASSIUM: 4.7 mmol/L (ref 3.5–5.1)
SODIUM: 130 mmol/L — AB (ref 135–145)

## 2017-04-03 LAB — PROCALCITONIN

## 2017-04-03 LAB — STREP PNEUMONIAE URINARY ANTIGEN: Strep Pneumo Urinary Antigen: NEGATIVE

## 2017-04-03 LAB — SODIUM, URINE, RANDOM

## 2017-04-03 MED ORDER — PHENYLEPHRINE HCL 0.25 % NA SOLN
1.0000 | Freq: Four times a day (QID) | NASAL | Status: DC | PRN
Start: 2017-04-03 — End: 2017-04-03
  Filled 2017-04-03: qty 15

## 2017-04-03 MED ORDER — METOPROLOL TARTRATE 5 MG/5ML IV SOLN
5.0000 mg | Freq: Three times a day (TID) | INTRAVENOUS | Status: DC | PRN
  Administered 2017-04-06: 2.5 mg via INTRAVENOUS
  Filled 2017-04-03 (×2): qty 5

## 2017-04-03 MED ORDER — FAMOTIDINE 20 MG PO TABS
20.0000 mg | ORAL_TABLET | Freq: Every day | ORAL | Status: DC
  Administered 2017-04-03 – 2017-04-10 (×8): 20 mg via ORAL
  Filled 2017-04-03 (×8): qty 1

## 2017-04-03 MED ORDER — BUTAMBEN-TETRACAINE-BENZOCAINE 2-2-14 % EX AERO
1.0000 | INHALATION_SPRAY | Freq: Once | CUTANEOUS | Status: DC
  Filled 2017-04-03: qty 20

## 2017-04-03 MED ORDER — LIDOCAINE HCL 2 % EX GEL
1.0000 "application " | Freq: Once | CUTANEOUS | Status: DC
  Filled 2017-04-03: qty 30
  Filled 2017-04-03: qty 5

## 2017-04-03 MED ORDER — MOMETASONE FURO-FORMOTEROL FUM 200-5 MCG/ACT IN AERO: 2.0000 | INHALATION_SPRAY | Freq: Two times a day (BID) | RESPIRATORY_TRACT | Status: DC

## 2017-04-03 MED ORDER — ALBUTEROL SULFATE HFA 108 (90 BASE) MCG/ACT IN AERS: 2.0000 | INHALATION_SPRAY | RESPIRATORY_TRACT | Status: DC | PRN

## 2017-04-03 MED ORDER — SODIUM CHLORIDE 0.9 % IV SOLN
INTRAVENOUS | Status: AC
  Administered 2017-04-03: 08:00:00 via INTRAVENOUS

## 2017-04-03 MED ORDER — METOPROLOL TARTRATE 5 MG/5ML IV SOLN
2.5000 mg | Freq: Three times a day (TID) | INTRAVENOUS | Status: DC | PRN
  Filled 2017-04-03: qty 5

## 2017-04-03 MED ORDER — ALBUTEROL SULFATE (2.5 MG/3ML) 0.083% IN NEBU
INHALATION_SOLUTION | RESPIRATORY_TRACT | Status: AC
  Administered 2017-04-03: 2.5 mg
  Filled 2017-04-03: qty 3

## 2017-04-03 MED ORDER — DILTIAZEM HCL ER COATED BEADS 180 MG PO CP24
180.0000 mg | ORAL_CAPSULE | Freq: Every day | ORAL | Status: DC
Start: 2017-04-03 — End: 2017-04-04
  Administered 2017-04-03: 180 mg via ORAL
  Filled 2017-04-03: qty 1

## 2017-04-03 MED ORDER — ALBUTEROL SULFATE (2.5 MG/3ML) 0.083% IN NEBU
2.5000 mg | INHALATION_SOLUTION | RESPIRATORY_TRACT | Status: DC | PRN
  Administered 2017-04-04 – 2017-04-06 (×4): 2.5 mg via RESPIRATORY_TRACT
  Filled 2017-04-03 (×4): qty 3

## 2017-04-03 MED ORDER — MOMETASONE FURO-FORMOTEROL FUM 200-5 MCG/ACT IN AERO
2.0000 | INHALATION_SPRAY | Freq: Two times a day (BID) | RESPIRATORY_TRACT | Status: DC
  Administered 2017-04-03 – 2017-04-09 (×13): 2 via RESPIRATORY_TRACT
  Filled 2017-04-03: qty 8.8

## 2017-04-03 MED ORDER — BENZONATATE 100 MG PO CAPS
100.0000 mg | ORAL_CAPSULE | Freq: Three times a day (TID) | ORAL | Status: DC | PRN
  Administered 2017-04-04 – 2017-04-05 (×2): 100 mg via ORAL
  Filled 2017-04-03 (×3): qty 1

## 2017-04-03 NOTE — ED Notes (Signed)
Bed: WA08 Expected date:  Expected time:  Means of arrival:  Comments: 

## 2017-04-03 NOTE — Progress Notes (Addendum)
Report called to [facility] on 4th floor. Patient to be transported via bed. Receiving nurse made aware of redness and intent to place foam dressing. Receiving nurse states that she will place dressing because she will have to take it off to complete skin assessment.

## 2017-04-03 NOTE — Progress Notes (Signed)
Skin assessment reveals redness noted to sacrum and lower back. Sacrum blanches in some but not all areas. Will get second nurse to observe skin.

## 2017-04-03 NOTE — Progress Notes (Signed)
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   She was referred to me by pulmonologist Dr. 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 Electronic system was used during visit with [place] interpreter.   Prior to admitting pt, she had a bronchoscopy in 09/2016 and the cytology done was highly suspicious for adenocarcinoma. Patient's daughter apparently wanted her to be treated in [place] so patient did not show to previous oncology consultation as scheduled with Dr Julien Nordmann. The patient has been having waxing and waning respiration symptoms for several months along with having a significant comorbidities of chronic bronchitis and chronic Afib. She has had several courses of different antibiotics. She has been on steroids, nebulizer and 3L of supplemental oxygen at home. In her current ED stay she has been given rocephin and azithromycin.   Today pt notes she had a TB test in workup in [place] in the summer of 2018.  She notes having significant fluid in her lung and hopes to remove this. Her daughter, Michelle Mccall, is the only family in [place] at this time and was not present at bedside for discussion.  On review of symptoms, pt has significant SOB, aggressive productive coughing with significant whitish/clear phlegm. She denies abdominal pain at this time. She notes to having a gallstone. She notes she is losing weight due to poor appetite and she has no energy to go outside.   See Also Epic MRN [id number] for older medical records.    MEDICAL HISTORY:  Past Medical  History:  Diagnosis Date  . Chronic a-fib ([place])   . Chronic bronchitis ([place])   . DM2 (diabetes mellitus, type 2) ([place])   . High cholesterol     SURGICAL HISTORY: Past Surgical History:  Procedure Laterality Date  . BACK SURGERY    . ECTOPIC PREGNANCY SURGERY      SOCIAL HISTORY: Social History   Socioeconomic History  . Marital status: Widowed    Spouse name: Not on file  . Number of children: Not on file  . Years of education: Not on file  . Highest education level: Not on file  Social Needs  . Financial resource strain: Not on file  . Food insecurity - worry: Not on file  . Food insecurity - inability: Not on file  . Transportation needs - medical: Not on file  . Transportation needs - non-medical: Not on file  Occupational History  . Not on file  Tobacco Use  . Smoking status: Never Smoker  Substance and Sexual Activity  . Alcohol use: No    Frequency: Never  . Drug use: No  . Sexual activity: Not on file  Other Topics Concern  . Not on file  Social History Narrative  . Not on file    FAMILY HISTORY: Family History  Family history unknown: Yes    ALLERGIES:  is allergic to prednisone.  MEDICATIONS:  Current Facility-Administered Medications  Medication Dose Route Frequency Provider Last Rate Last Dose  . 0.9 %  sodium chloride infusion   Intravenous Continuous Charlynne Cousins, MD 75 mL/hr at 04/03/17 [id number]    . azithromycin (ZITHROMAX) tablet 500  mg  500 mg Oral Q24H Jennette Kettle M, DO      . cefTRIAXone (ROCEPHIN) 1 g in sodium chloride 0.9 % 100 mL IVPB  1 g Intravenous Q24H Jennette Kettle M, DO      . enoxaparin (LOVENOX) injection 40 mg  40 mg Subcutaneous Q24H Jennette Kettle M, DO   40 mg at 04/03/17 0256  . insulin aspart (novoLOG) injection 0-9 Units  0-9 Units Subcutaneous TID WC Etta Quill, DO       No current outpatient medications on file.    REVIEW OF SYSTEMS:    10 Point review of Systems was done is negative except as  noted above.  PHYSICAL EXAMINATION: ECOG PERFORMANCE STATUS: 2 - Symptomatic, <50% confined to bed  . Vitals:   04/03/17 2003 04/03/17 2032  BP:  91/63  Pulse:  97  Resp:  (!) 24  Temp:  97.8 F (36.6 C)  SpO2: 92% 93%   Filed Weights   04/02/17 1958  Weight: 116 lb (52.6 kg)   .Body mass index is 23.43 kg/m.  NAD GENERAL:alert, in no acute distress and comfortable SKIN: no acute rashes, no significant lesions EYES: conjunctiva are pink and non-injected, sclera anicteric OROPHARYNX: MMM, no exudates, no oropharyngeal erythema or ulceration NECK: supple, no JVD LYMPH:  no palpable lymphadenopathy in the cervical, axillary or inguinal regions LUNGS: b/l rales and scattered rhonci HEART: irreg ABDOMEN:  normoactive bowel sounds , non tender, not distended. Extremity: 1+ pedal edema PSYCH: alert and oriented NEURO: no focal motor/sensory deficits  LABORATORY DATA:  I have reviewed the data as listed  . CBC Latest Ref Rng & Units 04/03/2017 04/02/2017  WBC 4.0 - 10.5 K/uL 12.5(H) 12.7(H)  Hemoglobin 12.0 - 15.0 g/dL 11.1(L) 13.2  Hematocrit 36.0 - 46.0 % 33.3(L) 38.7  Platelets 150 - 400 K/uL 317 343    . CMP Latest Ref Rng & Units 04/03/2017 04/02/2017  Glucose 65 - 99 mg/dL 59(L) 152(H)  BUN 6 - 20 mg/dL 21(H) 25(H)  Creatinine 0.44 - 1.00 mg/dL 0.58 0.67  Sodium 135 - 145 mmol/L 130(L) 127(L)  Potassium 3.5 - 5.1 mmol/L 4.7 5.6(H)  Chloride 101 - 111 mmol/L 93(L) 90(L)  CO2 22 - 32 mmol/L 26 26  Calcium 8.9 - 10.3 mg/dL 8.7(L) 9.4  Total Protein 6.5 - 8.1 g/dL - 7.9  Total Bilirubin 0.3 - 1.2 mg/dL - 0.6  Alkaline Phos 38 - 126 U/L - 62  AST 15 - 41 U/L - 25  ALT 14 - 54 U/L - 15   PATHOLOGY          PROCEDURES  Video Bronchoscopy on 10/11/17 by Dr. Vaughan Browner - Thin secretions from lt lung, normal airway anatomy - Bronchoalveolar lavage was performed. - Fluoroscopically guided transbronchial brushings were obtained. - Transbronchial lung biopsies  were performed.    RADIOGRAPHIC STUDIES: I have personally reviewed the radiological images as listed and agreed with the findings in the report. Dg Chest 2 View  Result Date: 04/02/2017 CLINICAL DATA:  Cough EXAM: CHEST  2 VIEW COMPARISON:  None. FINDINGS: Normal heart size. Normal mediastinal contour. No pneumothorax. Severe patchy consolidation throughout the left lung and at the right lung base. Possible small bilateral pleural effusions. IMPRESSION: Severe patchy consolidation throughout the left greater than right lungs, most compatible with multilobar pneumonia. Possible small bilateral pleural effusions. Short-term follow-up chest imaging advised. Electronically Signed   By: Ilona Sorrel M.D.   On: 04/02/2017 19:30   Ct Chest Wo  Contrast  Result Date: 04/02/2017 CLINICAL DATA:  Productive cough and shortness of breath. Pneumonia, unresolved or complicated. EXAM: CT CHEST WITHOUT CONTRAST TECHNIQUE: Multidetector CT imaging of the chest was performed following the standard protocol without IV contrast. COMPARISON:  Chest radiograph earlier this day.  No prior exams. FINDINGS: Cardiovascular: Thoracic aorta is normal in caliber with mild atherosclerosis. Dilatation of main pulmonary artery measuring 3.7 cm. Upper normal heart size. Minimal pericardial fluid. Mediastinum/Nodes: Calcified hilar and mediastinal nodes consistent prior granulomatous disease. No bulky noncalcified mediastinal lymph nodes, lack of IV contrast limits detailed assessment. The esophagus is decompressed. Lungs/Pleura: Left lung: Density within the left lower lobe bronchus causes occlusion at the origin. There is diffuse consolidation throughout the left lower lobe without significant volume loss with ground-glass, septal common consolidative opacities. Cystic spaces within the consolidation of the left lower lobe dependently, some of which appear branching and may reflect bronchiectasis. Ground-glass and multifocal  consolidative opacities throughout the left upper lobe, with some septal thickening and questionable honeycombing. Possible subpleural fibrosis in the left upper lobe. Small left pleural effusion. Right lung: Ground-glass and consolidative opacities throughout the entire right lower lobe with septal thickening. Air bronchograms peripherally. Minimal cystic spaces in the right lower lobe. Similar findings are seen in the right middle lobe to a lesser extent. The right bronchi are patent. Calcified granuloma in the right upper lobe. Scattered small subpleural densities in the anterior right upper lobe. Small right pleural effusion. Upper Abdomen: Unremarkable noncontrast appearance of the upper abdomen. Musculoskeletal: There are no acute or suspicious osseous abnormalities. Rib deformities with thinning of the left lateral eighth and ninth ribs. IMPRESSION: 1. Extensive bilateral ground-glass, patchy and consolidative opacities throughout both lungs, left greater than right. Disease distribution greatest in the lung bases and left upper lobe. Cystic lucencies in the lower lobes (mostly on the left) appear branching and may be bronchiectasis, however cavitary process could have this appearance, but is felt less likely. Differential diagnosis is broad and includes infectious, inflammatory, or neoplastic processes. There are no prior exams to evaluate for acuity. 2. Short segment left lower lobe bronchial occlusion. Recommend bronchoscopy for further evaluation to evaluate for obstructing mass or mucous plug. 3. Question degree of underlying interstitial lung disease with septal thickening and subpleural reticulation. 4. Prior granulomatous disease with calcified right upper lobe nodule and calcified mediastinal adenopathy. 5. Dilatation of the main pulmonary artery suggests pulmonary arterial hypertension. 6. Rib deformities of the left lateral eighth and ninth ribs may be postsurgical, recommend correlation with  surgical history. If there is history of left lung surgery, prior surgical pathology may aid in narrowing the differential. Electronically Signed   By: Jeb Levering M.D.   On: 04/02/2017 22:29    ASSESSMENT & PLAN:   Michelle Mccall is a 77 y.o. [place] female who presented to the [facility] hospital on 04/02/17 with a aggressive productive cough and SOB.    #1. 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  Lifelong non-smoker   #2. B/l pneumonia infiltrates ?multifocal malignancy,cannot r/o Possible superimposed pneumonia . 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 It is indeterminate at this time which parts are inflammation/infecton and which parts are possibly tumor.  -she will need to have a goals of care discussion with her daughter  being present and with hospital medicine team and pulmonlogy. -if she would like to pursue a tissue diagnosis and consideration of oncologic treatments then might need to consider bronchoscopy with tissue biopsy/BAL to evaluation for malignancy and r/oother atypical infections. -will need adequate lung tumor issue to send out foumdation one testing to evaluate for targetable genetic mutations. -I will also try to contact her Daughter Michelle Mccall and discuss these options with her as well. -Pt has been seen by Pulmonologist Dr. Lake Bells who hopes to do a repeat bronchoscopy this week.  -consider CT abd/pelvis and MRI brain to complete staging workup an determne if there are any other lesions that can be biopsied for a more definitive tissue diagnosis.  We shall continue to followup along with primary team. Additional recommendations pending tissue  diagnosis. [place], Sports administrator used for translation  All of the patients questions were answered with apparent satisfaction. The patient knows to call the clinic with any problems, questions or concerns.  I spent 60 minutes counseling the patient face to face. The total time spent in the appointment was 80 minutes and more than 50% was on counseling and direct patient cares.    Sullivan Lone MD [place] AAHIVMS [facility] Hematology/Oncology Physician [facility]  (Office):       [phone] (Work cell):  [phone] (Fax):           [phone]  04/03/2017 9:37 AM  This document serves as a record of services personally performed by Sullivan Lone, MD. It was created on his behalf by Joslyn Devon, a trained medical scribe. The creation of this record is based on the scribe's personal observations and the provider's statements to them.    .I have reviewed the above documentation for accuracy and completeness, and I agree with the above. Sullivan Lone MD MS

## 2017-04-03 NOTE — Care Management Note (Signed)
Case Management Note  Patient Details  Name: Michelle Mccall MRN: [id number] Date of Birth: [birth year]  Subjective/Objective: Transferred to floor from 3w.Will monitor for d/c plans.                   Action/Plan:d/c home.   Expected Discharge Date:  04/03/17               Expected Discharge Plan:  Home/Self Care  In-House Referral:     Discharge planning Services  CM Consult  Post Acute Care Choice:    Choice offered to:     DME Arranged:    DME Agency:     HH Arranged:    HH Agency:     Status of Service:  In process, will continue to follow  If discussed at Long Length of Stay Meetings, dates discussed:    Additional Comments:  Dessa Phi, RN 04/03/2017, 4:08 PM

## 2017-04-03 NOTE — Progress Notes (Signed)
  Echocardiogram 2D Echocardiogram has been performed.  Jannett Celestine 04/03/2017, 12:22 PM

## 2017-04-03 NOTE — Progress Notes (Addendum)
[facility] PROGRESS NOTE    Progress Note  Michelle Mccall  HUD:[id number] DOB: 1940-08-02 DOA: 04/02/2017 PCP: Patient, No Pcp Per     Brief Narrative:   Michelle Mccall is an 77 y.o. female past medical history of chronic bronchitis, chronic A. fib [place]-speaking who is been having waxing and waning respiratory symptoms for several months, has had several courses of empiric antibiotic and steroids, who came into the ED because of shortness of breath chest x-ray and CT scan show bilateral asymmetrical infiltrates, white count was 12 she denies any fevers, her BNP was 600 respiration rate 20-25 she was started empirically on IV Rocephin and Cipro  Assessment/Plan:   Sepsis (HCC)/Acute on chronic respiratory failure with hypoxia (HCC)/  Bilateral pulmonary infiltrates on chest x-ray: Started empirically on IV Rocephin and azithro she has a mild leukocytosis but no fever, she does have a mild leukocytosis with a left shift, culture data is pending. CT scan of the chest show bilateral asymmetrical infiltrates, We will get pulmonary for possible bronchoscopy. Influence of PCR negative.  We will not start steroids at this point. [place] includes interstitial lung disease, atypical infection which is unlikely, idiopathic eosinophilic Pneumonitis, idiopathic lung disease versus neoplasm. Consulted pulmonary for possible bronchoscopy.  Sinus tachycardia: Likely due to Sirs and missed dose of diltiazem, unlikely to be acute decompensated heart failure she has no JVD, no lower extremity edema. Likely reactive. decrease IV fluid I will not start empiric IV Lasix at this point.  Diabetes mellitus type 2 : continue sliding scale insulin.  Hypovolemic hyponatremia: Improving with IV fluid hydration continue IV fluids check a be met in the morning.  Normocytic anemia: Will need further follow-up as an outpatient.  DVT prophylaxis: Lovenox Family Communication:none Disposition  Plan/Barrier to D/C: unable to determine Code Status:     Code Status Orders  (From admission, onward)        Start     Ordered   04/02/17 2116  Full code  Continuous     04/02/17 2118    Code Status History    Date Active Date Inactive Code Status Order ID Comments User Context   This patient has a current code status but no historical code status.        IV Access:    Peripheral IV   Procedures and diagnostic studies:   Dg Chest 2 View  Result Date: 04/02/2017 CLINICAL DATA:  Cough EXAM: CHEST  2 VIEW COMPARISON:  None. FINDINGS: Normal heart size. Normal mediastinal contour. No pneumothorax. Severe patchy consolidation throughout the left lung and at the right lung base. Possible small bilateral pleural effusions. IMPRESSION: Severe patchy consolidation throughout the left greater than right lungs, most compatible with multilobar pneumonia. Possible small bilateral pleural effusions. Short-term follow-up chest imaging advised. Electronically Signed   By: Ilona Sorrel M.D.   On: 04/02/2017 19:30   Ct Chest Wo Contrast  Result Date: 04/02/2017 CLINICAL DATA:  Productive cough and shortness of breath. Pneumonia, unresolved or complicated. EXAM: CT CHEST WITHOUT CONTRAST TECHNIQUE: Multidetector CT imaging of the chest was performed following the standard protocol without IV contrast. COMPARISON:  Chest radiograph earlier this day.  No prior exams. FINDINGS: Cardiovascular: Thoracic aorta is normal in caliber with mild atherosclerosis. Dilatation of main pulmonary artery measuring 3.7 cm. Upper normal heart size. Minimal pericardial fluid. Mediastinum/Nodes: Calcified hilar and mediastinal nodes consistent prior granulomatous disease. No bulky noncalcified mediastinal lymph nodes, lack of IV contrast limits detailed assessment. The esophagus is decompressed. Lungs/Pleura:  Left lung: Density within the left lower lobe bronchus causes occlusion at the origin. There is diffuse  consolidation throughout the left lower lobe without significant volume loss with ground-glass, septal common consolidative opacities. Cystic spaces within the consolidation of the left lower lobe dependently, some of which appear branching and may reflect bronchiectasis. Ground-glass and multifocal consolidative opacities throughout the left upper lobe, with some septal thickening and questionable honeycombing. Possible subpleural fibrosis in the left upper lobe. Small left pleural effusion. Right lung: Ground-glass and consolidative opacities throughout the entire right lower lobe with septal thickening. Air bronchograms peripherally. Minimal cystic spaces in the right lower lobe. Similar findings are seen in the right middle lobe to a lesser extent. The right bronchi are patent. Calcified granuloma in the right upper lobe. Scattered small subpleural densities in the anterior right upper lobe. Small right pleural effusion. Upper Abdomen: Unremarkable noncontrast appearance of the upper abdomen. Musculoskeletal: There are no acute or suspicious osseous abnormalities. Rib deformities with thinning of the left lateral eighth and ninth ribs. IMPRESSION: 1. Extensive bilateral ground-glass, patchy and consolidative opacities throughout both lungs, left greater than right. Disease distribution greatest in the lung bases and left upper lobe. Cystic lucencies in the lower lobes (mostly on the left) appear branching and may be bronchiectasis, however cavitary process could have this appearance, but is felt less likely. Differential diagnosis is broad and includes infectious, inflammatory, or neoplastic processes. There are no prior exams to evaluate for acuity. 2. Short segment left lower lobe bronchial occlusion. Recommend bronchoscopy for further evaluation to evaluate for obstructing mass or mucous plug. 3. Question degree of underlying interstitial lung disease with septal thickening and subpleural reticulation. 4.  Prior granulomatous disease with calcified right upper lobe nodule and calcified mediastinal adenopathy. 5. Dilatation of the main pulmonary artery suggests pulmonary arterial hypertension. 6. Rib deformities of the left lateral eighth and ninth ribs may be postsurgical, recommend correlation with surgical history. If there is history of left lung surgery, prior surgical pathology may aid in narrowing the differential. Electronically Signed   By: Jeb Levering M.D.   On: 04/02/2017 22:29     Medical Consultants:    None.  Anti-Infectives:   IV Rocephin and Azithro  Subjective:    Michelle Mccall in no acute discomfort.  Objective:    Vitals:   04/03/17 0519 04/03/17 0600 04/03/17 0619 04/03/17 0621  BP: 94/70 (!) 80/64 (!) 85/65 (!) 85/59  Pulse:  (!) 138 (!) 139 92  Resp: (!) 30 (!) 25  (!) 31  Temp:      TempSrc:      SpO2: 92% 95% 94% 94%  Weight:      Height:        Intake/Output Summary (Last 24 hours) at 04/03/2017 0740 Last data filed at 04/03/2017 0108 Gross per 24 hour  Intake 950 ml  Output -  Net 950 ml   Filed Weights   04/02/17 1958  Weight: 52.6 kg (116 lb)    Exam: General exam: In no acute distress. Respiratory system: Air movement with crackles at the bases bilaterally Cardiovascular system: S1 & S2 heard, RRR. No JVD. Gastrointestinal system: Abdomen is nondistended, soft and nontender.  Central nervous system: Alert and oriented. No focal neurological deficits. Extremities: No pedal edema. Skin: No rashes, lesions or ulcers   Data Reviewed:    Labs: Basic Metabolic Panel: Recent Labs  Lab 04/02/17 1838  NA 127*  K 5.6*  CL 90*  CO2 26  GLUCOSE  152*  BUN 25*  CREATININE 0.67  CALCIUM 9.4   GFR Estimated Creatinine Clearance: 43.7 mL/min (by C-G formula based on SCr of 0.67 mg/dL). Liver Function Tests: Recent Labs  Lab 04/02/17 1838  AST 25  ALT 15  ALKPHOS 62  BILITOT 0.6  PROT 7.9  ALBUMIN 3.2*   No results  for input(s): LIPASE, AMYLASE in the last 168 hours. No results for input(s): AMMONIA in the last 168 hours. Coagulation profile Recent Labs  Lab 04/02/17 1838  INR 0.91    CBC: Recent Labs  Lab 04/02/17 1838 04/03/17 0031  WBC 12.7* 12.5*  NEUTROABS 10.7*  --   HGB 13.2 11.1*  HCT 38.7 33.3*  MCV 87.0 87.9  PLT 343 317   Cardiac Enzymes: No results for input(s): CKTOTAL, CKMB, CKMBINDEX, TROPONINI in the last 168 hours. BNP (last 3 results) No results for input(s): PROBNP in the last 8760 hours. CBG: Recent Labs  Lab 04/03/17 0507 04/03/17 0733  GLUCAP 118* 104*   D-Dimer: No results for input(s): DDIMER in the last 72 hours. Hgb A1c: No results for input(s): HGBA1C in the last 72 hours. Lipid Profile: No results for input(s): CHOL, HDL, LDLCALC, TRIG, CHOLHDL, LDLDIRECT in the last 72 hours. Thyroid function studies: No results for input(s): TSH, T4TOTAL, T3FREE, THYROIDAB in the last 72 hours.  Invalid input(s): FREET3 Anemia work up: No results for input(s): VITAMINB12, FOLATE, FERRITIN, TIBC, IRON, RETICCTPCT in the last 72 hours. Sepsis Labs: Recent Labs  Lab 04/02/17 1838 04/02/17 1851 04/03/17 0031  PROCALCITON  --   --  <0.10  WBC 12.7*  --  12.5*  LATICACIDVEN  --  1.60  --    Microbiology Recent Results (from the past 240 hour(s))  Culture, sputum-assessment     Status: None   Collection Time: 04/03/17  2:45 AM  Result Value Ref Range Status   Specimen Description SPUTUM  Final   Special Requests NONE  Final   Sputum evaluation   Final    THIS SPECIMEN IS ACCEPTABLE FOR SPUTUM CULTURE Performed at [facility], [place] [address], [place], [place] [address]    Report Status 04/03/2017 FINAL  Final     Medications:   . azithromycin  500 mg Oral Q24H  . enoxaparin (LOVENOX) injection  40 mg Subcutaneous Q24H  . insulin aspart  0-9 Units Subcutaneous TID WC   Continuous Infusions: . cefTRIAXone (ROCEPHIN)  IV        LOS: 1 day   [facility] Pager [phone]  *Please refer to [email or web address], password [email or web address] to get updated schedule on who will round on this patient, as hospitalists switch teams weekly. If 7PM-7AM, please contact night-coverage at [email or web address], password [email or web address] for any overnight needs.  04/03/2017, 7:40 AM

## 2017-04-03 NOTE — ED Notes (Signed)
Pt continues to have a very wet cough. Requires 4L of O2 to maintain spo2 of 89-90%.  Pt was given soup and warm tea and consumed both.  Pt is covered with many covers and seems a bit sweaty. Offered and then attempted to remove covers which pt refuses.  Will continue to monitor. Discussed spo2 and continued wet cough with Dr. Alcario Drought.

## 2017-04-03 NOTE — ED Notes (Signed)
Bed: WA08 Expected date:  Expected time:  Means of arrival:  Comments: rm 11

## 2017-04-03 NOTE — Progress Notes (Signed)
Patient's daughter at bedside and spoke to RN at length regarding bronchoscopy. She stated "she is not going to have a bronchoscopy done. She was so sick after the last one and she does not have cancer, we are not doing it again." Patient's daughter requested that RN notified MD regarding cancelling the bronchoscopy tomorrow because it will not be happening. MD paged.  No new orders received at this time.

## 2017-04-03 NOTE — ED Notes (Signed)
ED TO INPATIENT HANDOFF REPORT  Name/Age/Gender Michelle Mccall 77 y.o. female  Code Status    Code Status Orders  (From admission, onward)        Start     Ordered   04/02/17 2116  Full code  Continuous     04/02/17 2118    Code Status History    Date Active Date Inactive Code Status Order ID Comments User Context   This patient has a current code status but no historical code status.      Home/SNF/Other  Chief Complaint SOB   Level of Care/Admitting Diagnosis ED Disposition    ED Disposition Condition [facility]: [place] [[id number]]  Level of Care: Med-Surg [16]  Diagnosis: Acute respiratory failure with hypoxia [facility]) [[id number]]  Admitting Physician: Charlynne Cousins [[id number]]  Attending Physician: Charlynne Cousins [[id number]]  Estimated length of stay: past midnight tomorrow  Certification:: I certify this patient will need inpatient services for at least 2 midnights  PT Class (Do Not Modify): Inpatient [101]  PT Acc Code (Do Not Modify): Private [1]       Medical History Past Medical History:  Diagnosis Date  . Chronic a-fib ([place])   . Chronic bronchitis ([place])   . DM2 (diabetes mellitus, type 2) ([place])   . High cholesterol     Allergies Allergies  Allergen Reactions  . Prednisone     IV Location/Drains/Wounds Patient Lines/Drains/Airways Status   Active Line/Drains/Airways    Name:   Placement date:   Placement time:   Site:   Days:   Peripheral IV 04/02/17 Left Wrist   04/02/17    1843    Wrist   1          Labs/Imaging Results for orders placed or performed during the hospital encounter of 04/02/17 (from the past 48 hour(s))  Comprehensive metabolic panel     Status: Abnormal   Collection Time: 04/02/17  6:38 PM  Result Value Ref Range   Sodium 127 (L) 135 - 145 mmol/L   Potassium 5.6 (H) 3.5 - 5.1 mmol/L   Chloride 90 (L) 101 - 111 mmol/L   CO2 26 22 - 32 mmol/L   Glucose, Bld 152 (H) 65 - 99  mg/dL   BUN 25 (H) 6 - 20 mg/dL   Creatinine, Ser 0.67 0.44 - 1.00 mg/dL   Calcium 9.4 8.9 - 10.3 mg/dL   Total Protein 7.9 6.5 - 8.1 g/dL   Albumin 3.2 (L) 3.5 - 5.0 g/dL   AST 25 15 - 41 U/L   ALT 15 14 - 54 U/L   Alkaline Phosphatase 62 38 - 126 U/L   Total Bilirubin 0.6 0.3 - 1.2 mg/dL   GFR calc non Af Amer >60 >60 mL/min   GFR calc Af Amer >60 >60 mL/min    Comment: (NOTE) The eGFR has been calculated using the CKD EPI equation. This calculation has not been validated in all clinical situations. eGFR's persistently <60 mL/min signify possible Chronic Kidney Disease.    Anion gap 11 5 - 15    Comment: Performed at [facility], [place] [address], [place], [place] [address]  CBC with Differential     Status: Abnormal   Collection Time: 04/02/17  6:38 PM  Result Value Ref Range   WBC 12.7 (H) 4.0 - 10.5 K/uL   RBC 4.45 3.87 - 5.11 MIL/uL   Hemoglobin 13.2 12.0 - 15.0 g/dL   HCT 38.7  36.0 - 46.0 %   MCV 87.0 78.0 - 100.0 fL   MCH 29.7 26.0 - 34.0 pg   MCHC 34.1 30.0 - 36.0 g/dL   RDW 14.5 11.5 - 15.5 %   Platelets 343 150 - 400 K/uL   Neutrophils Relative % 85 %   Neutro Abs 10.7 (H) 1.7 - 7.7 K/uL   Lymphocytes Relative 10 %   Lymphs Abs 1.3 0.7 - 4.0 K/uL   Monocytes Relative 5 %   Monocytes Absolute 0.6 0.1 - 1.0 K/uL   Eosinophils Relative 0 %   Eosinophils Absolute 0.0 0.0 - 0.7 K/uL   Basophils Relative 0 %   Basophils Absolute 0.0 0.0 - 0.1 K/uL    Comment: Performed at [facility], [place] [address] [place], [place] [address]  Protime-INR     Status: None   Collection Time: 04/02/17  6:38 PM  Result Value Ref Range   Prothrombin Time 12.2 11.4 - 15.2 seconds   INR 0.91     Comment: Performed at [facility], [place] [address] [place], [place] [address]  I-Stat CG4 Lactic Acid, ED     Status: None   Collection Time: 04/02/17  6:51 PM  Result Value Ref Range   Lactic Acid, Venous 1.60 0.5 - 1.9 mmol/L   Brain natriuretic peptide     Status: Abnormal   Collection Time: 04/02/17  6:53 PM  Result Value Ref Range   B Natriuretic Peptide 674.2 (H) 0.0 - 100.0 pg/mL    Comment: Performed at [facility], [place] [address] [place], [place] [address]  Influenza panel by PCR (type A & B)     Status: None   Collection Time: 04/02/17 10:43 PM  Result Value Ref Range   Influenza A By PCR NEGATIVE NEGATIVE   Influenza B By PCR NEGATIVE NEGATIVE    Comment: (NOTE) The Xpert Xpress Flu assay is intended as an aid in the diagnosis of  influenza and should not be used as a sole basis for treatment.  This  assay is FDA approved for nasopharyngeal swab specimens only. Nasal  washings and aspirates are unacceptable for Xpert Xpress Flu testing. Performed at [facility], [place] [address], [place], [place] [address]   Basic metabolic panel     Status: Abnormal   Collection Time: 04/03/17 12:31 AM  Result Value Ref Range   Sodium 130 (L) 135 - 145 mmol/L   Potassium 4.7 3.5 - 5.1 mmol/L   Chloride 93 (L) 101 - 111 mmol/L   CO2 26 22 - 32 mmol/L   Glucose, Bld 59 (L) 65 - 99 mg/dL   BUN 21 (H) 6 - 20 mg/dL   Creatinine, Ser 0.58 0.44 - 1.00 mg/dL   Calcium 8.7 (L) 8.9 - 10.3 mg/dL   GFR calc non Af Amer >60 >60 mL/min   GFR calc Af Amer >60 >60 mL/min    Comment: (NOTE) The eGFR has been calculated using the CKD EPI equation. This calculation has not been validated in all clinical situations. eGFR's persistently <60 mL/min signify possible Chronic Kidney Disease.    Anion gap 11 5 - 15    Comment: Performed at [facility], [place] [address], [place], [place] [address]  CBC     Status: Abnormal   Collection Time: 04/03/17 12:31 AM  Result Value Ref Range   WBC 12.5 (H) 4.0 - 10.5 K/uL   RBC 3.79 (L) 3.87 - 5.11 MIL/uL  Hemoglobin 11.1 (L) 12.0 - 15.0 g/dL   HCT 33.3 (L) 36.0 - 46.0 %   MCV 87.9 78.0 - 100.0 fL   MCH 29.3 26.0 - 34.0 pg    MCHC 33.3 30.0 - 36.0 g/dL   RDW 14.9 11.5 - 15.5 %   Platelets 317 150 - 400 K/uL    Comment: Performed at [facility], [place] [address], [place], [place] [address]  Procalcitonin - Baseline     Status: None   Collection Time: 04/03/17 12:31 AM  Result Value Ref Range   Procalcitonin <0.10 ng/mL    Comment:        Interpretation: PCT (Procalcitonin) <= 0.5 ng/mL: Systemic infection (sepsis) is not likely. Local bacterial infection is possible. (NOTE)       Sepsis PCT Algorithm           Lower Respiratory Tract                                      Infection PCT Algorithm    ----------------------------     ----------------------------         PCT < 0.25 ng/mL                PCT < 0.10 ng/mL         Strongly encourage             Strongly discourage   discontinuation of antibiotics    initiation of antibiotics    ----------------------------     -----------------------------       PCT 0.25 - 0.50 ng/mL            PCT 0.10 - 0.25 ng/mL               OR       >80% decrease in PCT            Discourage initiation of                                            antibiotics      Encourage discontinuation           of antibiotics    ----------------------------     -----------------------------         PCT >= 0.50 ng/mL              PCT 0.26 - 0.50 ng/mL               AND        <80% decrease in PCT             Encourage initiation of                                             antibiotics       Encourage continuation           of antibiotics    ----------------------------     -----------------------------        PCT >= 0.50 ng/mL                  PCT > 0.50 ng/mL  AND         increase in PCT                  Strongly encourage                                      initiation of antibiotics    Strongly encourage escalation           of antibiotics                                     -----------------------------                                            PCT <= 0.25 ng/mL                                                 OR                                        > 80% decrease in PCT                                     Discontinue / Do not initiate                                             antibiotics Performed at [facility] [address] [place], [place] [address]   Urinalysis, Routine w reflex microscopic     Status: Abnormal   Collection Time: 04/03/17  2:42 AM  Result Value Ref Range   Color, Urine YELLOW YELLOW   APPearance CLEAR CLEAR   Specific Gravity, Urine 1.020 1.005 - 1.030   pH 5.0 5.0 - 8.0   Glucose, UA 150 (A) NEGATIVE mg/dL   Hgb urine dipstick SMALL (A) NEGATIVE   Bilirubin Urine NEGATIVE NEGATIVE   Ketones, ur 5 (A) NEGATIVE mg/dL   Protein, ur NEGATIVE NEGATIVE mg/dL   Nitrite NEGATIVE NEGATIVE   Leukocytes, UA MODERATE (A) NEGATIVE   RBC / HPF 6-30 0 - 5 RBC/hpf   WBC, UA 6-30 0 - 5 WBC/hpf   Bacteria, UA RARE (A) NONE SEEN   Squamous Epithelial / LPF 0-5 (A) NONE SEEN   Mucus PRESENT    Hyaline Casts, UA PRESENT     Comment: Performed at [facility], [place] [address] [place], [place] [address]  Strep pneumoniae urinary antigen     Status: None   Collection Time: 04/03/17  2:42 AM  Result Value Ref Range   Strep Pneumo Urinary Antigen NEGATIVE NEGATIVE    Comment:        Infection due to S. pneumoniae cannot be absolutely ruled out since the antigen present may be below the detection limit of the test.  Culture, sputum-assessment     Status: None   Collection Time: 04/03/17  2:45 AM  Result Value Ref Range   Specimen Description SPUTUM    Special Requests NONE    Sputum evaluation      THIS SPECIMEN IS ACCEPTABLE FOR SPUTUM CULTURE Performed at [facility], [place] [address], [place], [place] [address]    Report Status 04/03/2017 FINAL   CBG monitoring, ED     Status: Abnormal   Collection Time: 04/03/17  5:07 AM  Result Value Ref  Range   Glucose-Capillary 118 (H) 65 - 99 mg/dL   Comment 1 Notify RN   CBG monitoring, ED     Status: Abnormal   Collection Time: 04/03/17  7:33 AM  Result Value Ref Range   Glucose-Capillary 104 (H) 65 - 99 mg/dL   Dg Chest 2 View  Result Date: 04/02/2017 CLINICAL DATA:  Cough EXAM: CHEST  2 VIEW COMPARISON:  None. FINDINGS: Normal heart size. Normal mediastinal contour. No pneumothorax. Severe patchy consolidation throughout the left lung and at the right lung base. Possible small bilateral pleural effusions. IMPRESSION: Severe patchy consolidation throughout the left greater than right lungs, most compatible with multilobar pneumonia. Possible small bilateral pleural effusions. Short-term follow-up chest imaging advised. Electronically Signed   By: Ilona Sorrel M.D.   On: 04/02/2017 19:30   Ct Chest Wo Contrast  Result Date: 04/02/2017 CLINICAL DATA:  Productive cough and shortness of breath. Pneumonia, unresolved or complicated. EXAM: CT CHEST WITHOUT CONTRAST TECHNIQUE: Multidetector CT imaging of the chest was performed following the standard protocol without IV contrast. COMPARISON:  Chest radiograph earlier this day.  No prior exams. FINDINGS: Cardiovascular: Thoracic aorta is normal in caliber with mild atherosclerosis. Dilatation of main pulmonary artery measuring 3.7 cm. Upper normal heart size. Minimal pericardial fluid. Mediastinum/Nodes: Calcified hilar and mediastinal nodes consistent prior granulomatous disease. No bulky noncalcified mediastinal lymph nodes, lack of IV contrast limits detailed assessment. The esophagus is decompressed. Lungs/Pleura: Left lung: Density within the left lower lobe bronchus causes occlusion at the origin. There is diffuse consolidation throughout the left lower lobe without significant volume loss with ground-glass, septal common consolidative opacities. Cystic spaces within the consolidation of the left lower lobe dependently, some of which appear  branching and may reflect bronchiectasis. Ground-glass and multifocal consolidative opacities throughout the left upper lobe, with some septal thickening and questionable honeycombing. Possible subpleural fibrosis in the left upper lobe. Small left pleural effusion. Right lung: Ground-glass and consolidative opacities throughout the entire right lower lobe with septal thickening. Air bronchograms peripherally. Minimal cystic spaces in the right lower lobe. Similar findings are seen in the right middle lobe to a lesser extent. The right bronchi are patent. Calcified granuloma in the right upper lobe. Scattered small subpleural densities in the anterior right upper lobe. Small right pleural effusion. Upper Abdomen: Unremarkable noncontrast appearance of the upper abdomen. Musculoskeletal: There are no acute or suspicious osseous abnormalities. Rib deformities with thinning of the left lateral eighth and ninth ribs. IMPRESSION: 1. Extensive bilateral ground-glass, patchy and consolidative opacities throughout both lungs, left greater than right. Disease distribution greatest in the lung bases and left upper lobe. Cystic lucencies in the lower lobes (mostly on the left) appear branching and may be bronchiectasis, however cavitary process could have this appearance, but is felt less likely. Differential diagnosis is broad and includes infectious, inflammatory, or neoplastic processes. There are no prior exams to evaluate for acuity. 2. Short segment left lower lobe bronchial  occlusion. Recommend bronchoscopy for further evaluation to evaluate for obstructing mass or mucous plug. 3. Question degree of underlying interstitial lung disease with septal thickening and subpleural reticulation. 4. Prior granulomatous disease with calcified right upper lobe nodule and calcified mediastinal adenopathy. 5. Dilatation of the main pulmonary artery suggests pulmonary arterial hypertension. 6. Rib deformities of the left lateral  eighth and ninth ribs may be postsurgical, recommend correlation with surgical history. If there is history of left lung surgery, prior surgical pathology may aid in narrowing the differential. Electronically Signed   By: Jeb Levering M.D.   On: 04/02/2017 22:29    Pending Labs Unresulted Labs (From admission, onward)   Start     Ordered   04/03/17 0405  Sodium, urine, random  Once,   R     04/03/17 0404   04/03/17 0405  Osmolality, urine  Once,   R     04/03/17 0404   04/03/17 0245  Culture, respiratory (NON-Expectorated)  Once,   R     04/03/17 0245   04/02/17 2115  HIV antibody  Once,   R     04/02/17 2118   04/02/17 1758  Culture, blood (Routine x 2)  BLOOD CULTURE X 2,   STAT,   Status:  Canceled     04/02/17 1757      Vitals/Pain Today's Vitals   04/03/17 0621 04/03/17 0734 04/03/17 0830 04/03/17 0900  BP: (!) _0 (!) 87/67  Pulse: 92 81 (!) 136 (!) 113  Resp: (!) 31 (!) 31 19 (!) 28  Temp:      TempSrc:      SpO2: 94% 92% 90% 93%  Weight:      Height:      PainSc:        Isolation Precautions Droplet precaution  Medications Medications  enoxaparin (LOVENOX) injection 40 mg (40 mg Subcutaneous Given 04/03/17 0256)  cefTRIAXone (ROCEPHIN) 1 g in sodium chloride 0.9 % 100 mL IVPB (not administered)  azithromycin (ZITHROMAX) tablet 500 mg (not administered)  insulin aspart (novoLOG) injection 0-9 Units (0 Units Subcutaneous Not Given 04/03/17 0736)  0.9 %  sodium chloride infusion ( Intravenous New Bag/Given 04/03/17 0821)  albuterol (PROVENTIL) (2.5 MG/3ML) 0.083% nebulizer solution 5 mg (5 mg Nebulization Given 04/02/17 2008)  cefTRIAXone (ROCEPHIN) 2 g in sodium chloride 0.9 % 100 mL IVPB (0 g Intravenous Stopped 04/02/17 2110)  azithromycin (ZITHROMAX) 500 mg in sodium chloride 0.9 % 250 mL IVPB (0 mg Intravenous Stopped 04/02/17 2239)  sodium bicarbonate injection 50 mEq (50 mEq Intravenous Given 04/02/17 2142)  calcium gluconate 1 g in sodium  chloride 0.9 % 100 mL IVPB (0 g Intravenous Stopped 04/02/17 2240)  dextrose 50 % solution 50 mL (50 mLs Intravenous Given 04/02/17 2140)  insulin aspart (novoLOG) injection 10 Units (10 Units Intravenous Given 04/02/17 2157)  sodium chloride 0.9 % bolus 500 mL (0 mLs Intravenous Stopped 04/03/17 0108)    Mobility

## 2017-04-03 NOTE — Consult Note (Signed)
PULMONARY / CRITICAL CARE MEDICINE   Name: Michelle Mccall MRN: [id number] DOB: 05-02-1940    ADMISSION DATE:  04/02/2017 CONSULTATION DATE: April 03, 2017  REFERRING MD: Dr. Rollene Fare  CHIEF COMPLAINT: Shortness of breath  HISTORY OF PRESENT ILLNESS:   This is a 77 year old female who is [place] speaking only and there is no translator present.  History was obtained from talking to staff who has seen her and reviewing the chart.  Briefly, this patient was previously seen by my partner in August 2018 for a persistent infiltrate in the left lung.  She underwent a bronchoscopy at that time.  Transbronchial biopsies were negative for malignancy but a BAL cytology was worrisome for adenocarcinoma.  Per my discussion with my partner the patient's daughter was informed of the diagnosis and said that she wanted to take her mother home to [place] for treatment.  A consult was placed to the [facility] cancer center but the patient did not show up for the appointment.  She returns to the hospital today with cough, shortness of breath.  The patient tells me that she has not been sleeping but that is the extent of our conversation.  Was seen by [place] in August 2018: see Epic MRN [id number]  PAST MEDICAL HISTORY :  She  has a past medical history of Chronic a-fib ([place]), Chronic bronchitis ([place]), DM2 (diabetes mellitus, type 2) ([place]), and High cholesterol.  PAST SURGICAL HISTORY: She  has a past surgical history that includes Back surgery and Ectopic pregnancy surgery.  Allergies  Allergen Reactions  . Prednisone     No current facility-administered medications on file prior to encounter.    No current outpatient medications on file prior to encounter.    FAMILY HISTORY:  Her has no family status information on file.    SOCIAL HISTORY: She  reports that  has never smoked. She does not have any smokeless tobacco history on file. She reports that she does not drink alcohol or use  drugs.  REVIEW OF SYSTEMS:   Cannot obtain due to language barrier  SUBJECTIVE:  As above  VITAL SIGNS: BP 90/72   Pulse 81   Temp 98.5 F (36.9 C) (Oral)   Resp (!) 31   Ht _0  (1.499 m)   Wt 116 lb (52.6 kg)   SpO2 92%   BMI 23.43 kg/m   HEMODYNAMICS:    VENTILATOR SETTINGS:    INTAKE / OUTPUT: I/O last 3 completed shifts: In: 950 [IV Piggyback:950] Out: -   PHYSICAL EXAMINATION:  General:  Resting in bed, tachycardic, mild tachypnea, no accessory muscle use HENT: NCAT OP clear PULM: wheezing worse on left than right CV: RRR, no mgr GI: soft, nontender  MSK: normal bulk and tone Neuro: awake, alert, no distress, MAEW   LABS:  BMET Recent Labs  Lab 04/02/17 1838 04/03/17 0031  NA 127* 130*  K 5.6* 4.7  CL 90* 93*  CO2 26 26  BUN 25* 21*  CREATININE 0.67 0.58  GLUCOSE 152* 59*    Electrolytes Recent Labs  Lab 04/02/17 1838 04/03/17 0031  CALCIUM 9.4 8.7*    CBC Recent Labs  Lab 04/02/17 1838 04/03/17 0031  WBC 12.7* 12.5*  HGB 13.2 11.1*  HCT 38.7 33.3*  PLT 343 317    Coag's Recent Labs  Lab 04/02/17 1838  INR 0.91    Sepsis Markers Recent Labs  Lab 04/02/17 1851 04/03/17 0031  LATICACIDVEN 1.60  --   PROCALCITON  --  <  0.10    ABG No results for input(s): PHART, PCO2ART, PO2ART in the last 168 hours.  Liver Enzymes Recent Labs  Lab 04/02/17 1838  AST 25  ALT 15  ALKPHOS 62  BILITOT 0.6  ALBUMIN 3.2*    Cardiac Enzymes No results for input(s): TROPONINI, PROBNP in the last 168 hours.  Glucose Recent Labs  Lab 04/03/17 0507 04/03/17 0733  GLUCAP 118* 104*    Imaging Dg Chest 2 View  Result Date: 04/02/2017 CLINICAL DATA:  Cough EXAM: CHEST  2 VIEW COMPARISON:  None. FINDINGS: Normal heart size. Normal mediastinal contour. No pneumothorax. Severe patchy consolidation throughout the left lung and at the right lung base. Possible small bilateral pleural effusions. IMPRESSION: Severe patchy  consolidation throughout the left greater than right lungs, most compatible with multilobar pneumonia. Possible small bilateral pleural effusions. Short-term follow-up chest imaging advised. Electronically Signed   By: Ilona Sorrel M.D.   On: 04/02/2017 19:30   Ct Chest Wo Contrast  Result Date: 04/02/2017 CLINICAL DATA:  Productive cough and shortness of breath. Pneumonia, unresolved or complicated. EXAM: CT CHEST WITHOUT CONTRAST TECHNIQUE: Multidetector CT imaging of the chest was performed following the standard protocol without IV contrast. COMPARISON:  Chest radiograph earlier this day.  No prior exams. FINDINGS: Cardiovascular: Thoracic aorta is normal in caliber with mild atherosclerosis. Dilatation of main pulmonary artery measuring 3.7 cm. Upper normal heart size. Minimal pericardial fluid. Mediastinum/Nodes: Calcified hilar and mediastinal nodes consistent prior granulomatous disease. No bulky noncalcified mediastinal lymph nodes, lack of IV contrast limits detailed assessment. The esophagus is decompressed. Lungs/Pleura: Left lung: Density within the left lower lobe bronchus causes occlusion at the origin. There is diffuse consolidation throughout the left lower lobe without significant volume loss with ground-glass, septal common consolidative opacities. Cystic spaces within the consolidation of the left lower lobe dependently, some of which appear branching and may reflect bronchiectasis. Ground-glass and multifocal consolidative opacities throughout the left upper lobe, with some septal thickening and questionable honeycombing. Possible subpleural fibrosis in the left upper lobe. Small left pleural effusion. Right lung: Ground-glass and consolidative opacities throughout the entire right lower lobe with septal thickening. Air bronchograms peripherally. Minimal cystic spaces in the right lower lobe. Similar findings are seen in the right middle lobe to a lesser extent. The right bronchi are patent.  Calcified granuloma in the right upper lobe. Scattered small subpleural densities in the anterior right upper lobe. Small right pleural effusion. Upper Abdomen: Unremarkable noncontrast appearance of the upper abdomen. Musculoskeletal: There are no acute or suspicious osseous abnormalities. Rib deformities with thinning of the left lateral eighth and ninth ribs. IMPRESSION: 1. Extensive bilateral ground-glass, patchy and consolidative opacities throughout both lungs, left greater than right. Disease distribution greatest in the lung bases and left upper lobe. Cystic lucencies in the lower lobes (mostly on the left) appear branching and may be bronchiectasis, however cavitary process could have this appearance, but is felt less likely. Differential diagnosis is broad and includes infectious, inflammatory, or neoplastic processes. There are no prior exams to evaluate for acuity. 2. Short segment left lower lobe bronchial occlusion. Recommend bronchoscopy for further evaluation to evaluate for obstructing mass or mucous plug. 3. Question degree of underlying interstitial lung disease with septal thickening and subpleural reticulation. 4. Prior granulomatous disease with calcified right upper lobe nodule and calcified mediastinal adenopathy. 5. Dilatation of the main pulmonary artery suggests pulmonary arterial hypertension. 6. Rib deformities of the left lateral eighth and ninth ribs may be postsurgical, recommend  correlation with surgical history. If there is history of left lung surgery, prior surgical pathology may aid in narrowing the differential. Electronically Signed   By: Jeb Levering M.D.   On: 04/02/2017 22:29   Likely adenocarcinoma of the lung, had bronchoscopy in 09/2016, see Epic MRN [id number]   STUDIES:  09/2016 bronchoscopy: transbronchial biopsies negative, BAL cytology highly suggestive of well differentiated adenocarcinoma 03/2017 CT chest > images independently reviewed showing an atypical  pattern with groundglass in the base of the right lung, scattered consolidation, likely cavitation in the left lower lobe and groundglass throughout the left lung.  Mucus or endobronchial lesion noted in the left mainstem bronchus.  CULTURES: 2/19 sputum>  2/18 blood >   ANTIBIOTICS: 2/18 ceftriaxone >  2/18 azithro >   SIGNIFICANT EVENTS:   LINES/TUBES:   DISCUSSION: 77 year old female comes back to our facility with shortness of breath and cough.  In August 2018 she underwent a bronchoscopy which was highly suspicious for adenocarcinoma.  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  She is a lifelong non-smoker.  The differential diagnosis includes atypical pneumonia or a pneumonia superinfection but that seems less likely.  ASSESSMENT / PLAN:  PULMONARY A: Acute respiratory failure with hypoxemia Bronchioloalveolar carcinoma Non-smoker Possible superimposed pneumonia P:   Oncology consult Administer O2 to maintain O2 saturation greater than 88% Agree with ceftriaxone and azithromycin for now May need repeat bronchoscopy for more tissue.  Apparently this is a complicated situation as the patient's daughter was informed of her diagnosis and referral to the cancer center in August 2018.  She refused to take her mother to the cancer center citing that she would take her to [place].  The daughter is currently unavailable for me.  We will consult oncology, if helpful I am happy to perform another bronchoscopy. Will make her NPO after midnight tonight  Roselie Awkward, MD [facility] Pager: [phone] Cell: [phone] After 3pm or if no response, call [phone]   04/03/2017, 8:16 AM

## 2017-04-03 NOTE — Progress Notes (Signed)
CT chest results noted.  Patient now NSR at 98.  Procalcitonin neg.  Doubt sepsis, will hold off on further IVF for the moment.  BNP 674, but imaging does not at all appear c/w CHF.  2d echo in AM, pulm consult in AM.

## 2017-04-03 NOTE — Progress Notes (Signed)
[place] has arrived to unit and initial HR is 140. Dr Venetia ConstableOlevia Bowens made aware and there are no plans to treat HR at this time.

## 2017-04-04 ENCOUNTER — Encounter (HOSPITAL_COMMUNITY): Payer: Self-pay

## 2017-04-04 ENCOUNTER — Encounter (HOSPITAL_COMMUNITY)
Admission: EM | Disposition: A | Discharge status: Home / Self Care | Payer: Self-pay | Source: Home / Self Care | Attending: Internal Medicine

## 2017-04-04 ENCOUNTER — Encounter (HOSPITAL_COMMUNITY): Payer: Self-pay | Admitting: *Deleted

## 2017-04-04 LAB — GLUCOSE, CAPILLARY
GLUCOSE-CAPILLARY: 108 mg/dL — AB (ref 65–99)
GLUCOSE-CAPILLARY: 110 mg/dL — AB (ref 65–99)
Glucose-Capillary: 192 mg/dL — ABNORMAL HIGH (ref 65–99)
Glucose-Capillary: 123 mg/dL — ABNORMAL HIGH (ref 65–99)

## 2017-04-04 LAB — HIV ANTIBODY (ROUTINE TESTING W REFLEX): HIV Screen 4th Generation wRfx: NONREACTIVE

## 2017-04-04 SURGERY — BRONCHOSCOPY, WITH FLUOROSCOPY
Anesthesia: Moderate Sedation | Laterality: Bilateral

## 2017-04-04 NOTE — Progress Notes (Signed)
[facility]  Discussed with Dr. Erlinda Hong.  The patient is not giving consent for bronchoscopy.    Overall picture completely consistent with BAC (high mucus output, diffuse infiltrates, etc).  I am happy to perform a bronchoscopy if the patient consents and if this would be helpful for treatment.  Please let me know if I can be of assistance.    Roselie Awkward, MD [facility] Pager: [phone] Cell: [phone] After 3pm or if no response, call [phone]

## 2017-04-04 NOTE — Progress Notes (Signed)
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 -CT scan of the chest show bilateral asymmetrical infiltrates,Influence of PCR negative.  We will not start steroids at this point. -case discussed with pulmonology and oncology, cliniallly highly suspicious for BAC, Patient declined  bronchoscopy. -palliative care consulted .  Sinus tachycardia: Likely due to Sirs and missed dose of diltiazem, unlikely to be acute decompensated heart failure she has no JVD, no lower extremity edema. Likely reactive. decrease IV fluid I will not start empiric IV Lasix at this point.  Diabetes mellitus type 2 : continue sliding scale insulin.  Hypovolemic hyponatremia: Improving with IV fluid hydration continue IV fluids check a be met in the morning.  Normocytic anemia: Will need further follow-up as an outpatient.  DVT prophylaxis: Lovenox Family Communication:daughter Disposition Plan/Barrier to D/C: unable to determine, pending palliative care consult   Code Status: full  Family Communication: patient  Through translator in AM, daughter briefly around 6pm, daughter is in a hurry did not have time to talk  Disposition Plan: pending  palliative care   Consultants:  Case discussed with pulmonology Dr Lake Bells over the phone today  Case discussed with oncology Dr Irene Limbo in person today  Palliative care  Procedures:  Planned bronchoscopy cancelled due to patient's refusal  Antibiotics:  zithro   Objective: BP (!) 82/61 (BP Location: Right Arm)   Pulse 98   Temp 97.8 F (36.6 C) (Oral)   Resp (!) 24   Ht '4\' 11"'$  (1.499 m)   Wt 46.3 kg (102 lb 1.2 oz)   SpO2 93%   BMI 20.62 kg/m   Intake/Output Summary (Last 24 hours) at 04/04/2017 0824 Last data filed at 04/03/2017 1836 Gross per 24 hour  Intake 768.75 ml  Output -  Net 768.75 ml   Filed Weights   04/02/17 1958 04/03/17 1000  Weight: 52.6 kg (116 lb) 46.3 kg (102 lb 1.2 oz)    Exam: Patient is examined daily including today on 04/04/2017, exams remain the same as of yesterday except that has changed    General:  Frail, constant productive cough  Cardiovascular: RRR  Respiratory: course, wheezing, rhonchi  Abdomen: Soft/ND/NT, positive BS  Musculoskeletal: No Edema  Neuro: alert, oriented   Data Reviewed: Basic Metabolic Panel: Recent Labs  Lab 04/02/17 1838 04/03/17 0031  NA 127* 130*  K 5.6* 4.7  CL 90* 93*  CO2 26 26  GLUCOSE 152* 59*  BUN 25* 21*  CREATININE 0.67 0.58  CALCIUM 9.4 8.7*   Liver Function Tests: Recent Labs  Lab 04/02/17 1838  AST 25  ALT 15  ALKPHOS 62  BILITOT 0.6  PROT 7.9  ALBUMIN 3.2*   No results for input(s): LIPASE, AMYLASE in the last 168 hours. No results for input(s): AMMONIA in the last  168 hours. CBC: Recent Labs  Lab 04/02/17 1838 04/03/17 0031  WBC 12.7* 12.5*  NEUTROABS 10.7*  --   HGB 13.2 11.1*  HCT 38.7 33.3*  MCV 87.0 87.9  PLT 343 317   Cardiac Enzymes:   No results for input(s): CKTOTAL, CKMB, CKMBINDEX, TROPONINI in the last 168 hours. BNP (last 3 results) Recent Labs    04/02/17 1853  BNP 674.2*    ProBNP (last 3 results) No results for input(s): PROBNP in  the last 8760 hours.  CBG: Recent Labs  Lab 04/03/17 0733 04/03/17 1159 04/03/17 1604 04/03/17 2038 04/04/17 0758  GLUCAP 104* 138* 118* 142* 108*    Recent Results (from the past 240 hour(s))  Culture, blood (Routine x 2)     Status: None (Preliminary result)   Collection Time: 04/02/17  6:38 PM  Result Value Ref Range Status   Specimen Description   Final    BLOOD LEFT WRIST Performed at [facility] [address] [place], [place] [address]    Special Requests   Final    BOTTLES DRAWN AEROBIC AND ANAEROBIC Blood Culture adequate volume Performed at [facility] [address], [place], [place] [address]    Culture   Final    NO GROWTH < 24 HOURS Performed at [facility] [place] [address], [place], [place] [address]    Report Status PENDING  Incomplete  Culture, sputum-assessment     Status: None   Collection Time: 04/03/17  2:45 AM  Result Value Ref Range Status   Specimen Description SPUTUM  Final   Special Requests NONE  Final   Sputum evaluation   Final    THIS SPECIMEN IS ACCEPTABLE FOR SPUTUM CULTURE Performed at [facility], [place] [address] [place], [place] [address]    Report Status 04/03/2017 FINAL  Final     Studies: No results found.  Scheduled Meds: . azithromycin  500 mg Oral Q24H  . enoxaparin (LOVENOX) injection  40 mg Subcutaneous Q24H  . famotidine  20 mg Oral Daily  . insulin aspart  0-9 Units Subcutaneous TID WC  . mometasone-formoterol  2 puff Inhalation BID    Continuous Infusions: . cefTRIAXone (ROCEPHIN)  IV 1 g (04/03/17 2149)     Time spent: more than 43mns, more than 50% time spent on coordination of care I have personally reviewed and interpreted on  04/04/2017 daily labs, tele strips, imagings as discussed above under date review session and assessment and plans.  I reviewed all nursing notes, pharmacy notes, consultant notes,  vitals, pertinent old  records  I have discussed plan of care as described above with RN , patient and family on 04/04/2017   FFlorencia ReasonsMD, PhD  [facility] Pager [phone] If 7PM-7AM, please contact night-coverage at [email or web address], password [email or web address]2/20/2019, 8:24 AM  LOS: 2 days

## 2017-04-05 ENCOUNTER — Inpatient Hospital Stay (HOSPITAL_COMMUNITY): Payer: Medicaid Other

## 2017-04-05 LAB — CULTURE, RESPIRATORY W GRAM STAIN: Culture: NORMAL

## 2017-04-05 LAB — GLUCOSE, CAPILLARY
GLUCOSE-CAPILLARY: 137 mg/dL — AB (ref 65–99)
GLUCOSE-CAPILLARY: 109 mg/dL — AB (ref 65–99)
GLUCOSE-CAPILLARY: 136 mg/dL — AB (ref 65–99)
Glucose-Capillary: 85 mg/dL (ref 65–99)

## 2017-04-05 LAB — CULTURE, RESPIRATORY

## 2017-04-05 MED ORDER — IPRATROPIUM BROMIDE 0.02 % IN SOLN
0.5000 mg | Freq: Three times a day (TID) | RESPIRATORY_TRACT | Status: DC
  Administered 2017-04-05: 0.5 mg via RESPIRATORY_TRACT
  Filled 2017-04-05: qty 2.5

## 2017-04-05 MED ORDER — LEVALBUTEROL HCL 1.25 MG/0.5ML IN NEBU
1.2500 mg | INHALATION_SOLUTION | Freq: Three times a day (TID) | RESPIRATORY_TRACT | Status: DC
  Administered 2017-04-05: 1.25 mg via RESPIRATORY_TRACT
  Filled 2017-04-05: qty 0.5

## 2017-04-05 MED ORDER — ACETAMINOPHEN 325 MG PO TABS
650.0000 mg | ORAL_TABLET | Freq: Four times a day (QID) | ORAL | Status: DC | PRN
  Administered 2017-04-05 (×2): 650 mg via ORAL
  Filled 2017-04-05 (×2): qty 2

## 2017-04-05 MED ORDER — LEVALBUTEROL HCL 1.25 MG/0.5ML IN NEBU: 1.2500 mg | INHALATION_SOLUTION | Freq: Three times a day (TID) | RESPIRATORY_TRACT | Status: DC

## 2017-04-05 MED ORDER — FUROSEMIDE 10 MG/ML IJ SOLN
40.0000 mg | Freq: Once | INTRAMUSCULAR | Status: AC
  Administered 2017-04-05: 40 mg via INTRAVENOUS
  Filled 2017-04-05: qty 4

## 2017-04-05 MED ORDER — METOPROLOL TARTRATE 5 MG/5ML IV SOLN
2.5000 mg | Freq: Once | INTRAVENOUS | Status: AC
  Administered 2017-04-05: 2.5 mg via INTRAVENOUS
  Filled 2017-04-05: qty 5

## 2017-04-05 MED ORDER — GUAIFENESIN ER 600 MG PO TB12
600.0000 mg | ORAL_TABLET | Freq: Two times a day (BID) | ORAL | Status: DC
  Administered 2017-04-05 – 2017-04-10 (×10): 600 mg via ORAL
  Filled 2017-04-05 (×10): qty 1

## 2017-04-05 MED ORDER — IPRATROPIUM BROMIDE 0.02 % IN SOLN: 0.5000 mg | Freq: Three times a day (TID) | RESPIRATORY_TRACT | Status: DC

## 2017-04-05 NOTE — Progress Notes (Signed)
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 -CT scan of the chest show bilateral asymmetrical infiltrates,Influence of PCR negative.   -case discussed with pulmonology and oncology, cliniallly highly suspicious for BAC, Patient declined  bronchoscopy. -palliative care consulted .  Sinus tachycardia: Likely due to Sirs and missed dose of diltiazem, unlikely to be acute decompensated heart failure she has no JVD, no lower extremity edema. Likely reactive. decrease IV fluid , not start empiric IV Lasix at this point.  Diabetes mellitus type 2 : continue sliding scale insulin.  Hypovolemic hyponatremia: Improving with IV fluid hydration continue IV fluids  check a be met in the morning.  Normocytic anemia: Will need further follow-up as an outpatient.  DVT prophylaxis: Lovenox Family Communication:daughter over the phone Disposition Plan/Barrier to D/C: unable to determine, pending palliative care consult   Code Status: full  Family Communication: patient in room,  daughter over the phone this  6pm,  daughter states she can come to the hospital  tomorrow after 4pm  Disposition Plan: pending palliative care   Consultants:  Case discussed with pulmonology Dr Lake Bells over the phone today  Case discussed with oncology Dr Irene Limbo in person today  Palliative care  Procedures:  Planned bronchoscopy cancelled due to patient's refusal  Antibiotics:  zithro/rocephin   Objective: BP 95/78 (BP Location: Left Arm)   Pulse (!) 107   Temp 98.5 F (36.9 C) (Oral)   Resp 20   Ht 4' 11" (1.499 m)   Wt 46.3 kg (102 lb 1.2 oz)   SpO2 94%   BMI 20.62 kg/m   Intake/Output Summary (Last 24 hours) at 04/05/2017 1935 Last data filed at 04/05/2017 1200 Gross per 24 hour  Intake 340 ml  Output 800 ml  Net -460 ml   Filed Weights   04/02/17 1958 04/03/17 1000  Weight: 52.6 kg (116 lb) 46.3 kg (102 lb 1.2 oz)    Exam: Patient is examined daily including today on 04/05/2017, exams remain the same as of yesterday except that has changed    General:  Frail, constant productive cough  Cardiovascular: RRR  Respiratory:  wheezing, rhonchi has improved today  Abdomen: Soft/ND/NT, positive BS  Musculoskeletal: No Edema  Neuro: alert, oriented   Data Reviewed: Basic Metabolic Panel: Recent Labs  Lab 04/02/17 1838 04/03/17 0031  NA 127* 130*  K 5.6* 4.7  CL 90* 93*  CO2 26 26  GLUCOSE 152* 59*  BUN 25* 21*  CREATININE 0.67 0.58  CALCIUM 9.4 8.7*   Liver Function Tests: Recent Labs  Lab 04/02/17 1838  AST 25  ALT 15  ALKPHOS 62  BILITOT 0.6  PROT 7.9  ALBUMIN 3.2*   No results for input(s): LIPASE, AMYLASE in the  last 168 hours. No results for input(s): AMMONIA in the last 168 hours. CBC: Recent Labs  Lab 04/02/17 1838 04/03/17 0031  WBC 12.7* 12.5*  NEUTROABS 10.7*  --   HGB 13.2 11.1*  HCT 38.7 33.3*  MCV 87.0 87.9  PLT 343 317   Cardiac Enzymes:   No results for input(s): CKTOTAL, CKMB, CKMBINDEX, TROPONINI in the last 168 hours. BNP (last 3 results) Recent Labs    04/02/17 1853  BNP 674.2*     ProBNP (last 3 results) No results for input(s): PROBNP in the last 8760 hours.  CBG: Recent Labs  Lab 04/04/17 1712 04/04/17 2307 04/05/17 0759 04/05/17 1155 04/05/17 1702  GLUCAP 123* 110* 85 137* 109*    Recent Results (from the past 240 hour(s))  Culture, blood (Routine x 2)     Status: None (Preliminary result)   Collection Time: 04/02/17  6:38 PM  Result Value Ref Range Status   Specimen Description BLOOD LEFT WRIST  Final   Special Requests   Final    BOTTLES DRAWN AEROBIC AND ANAEROBIC Blood Culture adequate volume Performed at [facility] [address] [place], [place] [address]    Culture   Final    NO GROWTH 3 DAYS Performed at [facility], [place] [address] [place], [place] [address]    Report Status PENDING  Incomplete  Culture, sputum-assessment     Status: None   Collection Time: 04/03/17  2:45 AM  Result Value Ref Range Status   Specimen Description SPUTUM  Final   Special Requests NONE  Final   Sputum evaluation   Final    THIS SPECIMEN IS ACCEPTABLE FOR SPUTUM CULTURE Performed at [facility], [place] [address], [place], [place] [address]    Report Status 04/03/2017 FINAL  Final  Culture, respiratory (NON-Expectorated)     Status: None   Collection Time: 04/03/17  2:45 AM  Result Value Ref Range Status   Specimen Description   Final    SPUTUM Performed at [facility] [address], [place], [place] [address]    Special Requests   Final    NONE Reflexed from [id number] Performed at [facility], [place] [address], [place], [place] [address]    Gram Stain   Final    FEW WBC PRESENT, PREDOMINANTLY MONONUCLEAR FEW SQUAMOUS EPITHELIAL CELLS PRESENT FEW YEAST FEW GRAM NEGATIVE RODS FEW GRAM POSITIVE COCCI IN CLUSTERS FEW GRAM POSITIVE RODS    Culture   Final    Consistent with normal respiratory flora. Performed at [facility], [place] [address],  [place], [place] [address]    Report Status 04/05/2017 FINAL  Final     Studies: Dg Chest Port 1 View  Result Date: 04/05/2017 CLINICAL DATA:  Pulmonary edema.  Increasing shortness of breath. EXAM: PORTABLE CHEST 1 VIEW COMPARISON:  Radiographs and CT 04/02/2017 FINDINGS: Persistent bilateral mid and lower lung zone opacities, slight worsening on the right from prior. Left-sided opacities are grossly similar. Bilateral pleural effusions. Cystic density at the left lung base is not as well seen. No pneumothorax. Mediastinal contours which early obscured due to pulmonary opacities. IMPRESSION: Persistent bilateral mid and lower lung zone opacities with slight worsening on the right from prior exam. Pleural effusions appear similar. Electronically Signed   By: Jeb Levering M.D.   On: 04/05/2017 00:38    Scheduled Meds: . azithromycin  500 mg Oral Q24H  . enoxaparin (LOVENOX) injection  40  mg Subcutaneous Q24H  . famotidine  20 mg Oral Daily  . guaiFENesin  600 mg Oral BID  . insulin aspart  0-9 Units Subcutaneous TID WC  . ipratropium  0.5 mg Nebulization Q8H  . levalbuterol  1.25 mg Nebulization Q8H  . mometasone-formoterol  2 puff Inhalation BID    Continuous Infusions: . cefTRIAXone (ROCEPHIN)  IV Stopped (04/04/17 2345)     Time spent:  69mnsI have personally reviewed and interpreted on  04/05/2017 daily labs, tele strips, imagings as discussed above under date review session and assessment and plans.  I reviewed all nursing notes, pharmacy notes, consultant notes,  vitals, pertinent old records  I have discussed plan of care as described above with RN , patient and family on 04/05/2017   FFlorencia ReasonsMD, PhD  [facility] Pager [phone] If 7PM-7AM, please contact night-coverage at [email or web address], password [email or web address]2/21/2019, 7:35 PM  LOS: 3 days

## 2017-04-05 NOTE — Progress Notes (Signed)
Palliative Medicine consult noted. Due to high referral volume, there may be a delay seeing this patient. Please call the Palliative Medicine Team office at [phone] if recommendations are needed in the interim.  Thank you for inviting [place] to see this patient.  Marjie Skiff Mykah Bellomo, RN, BSN, [facility] Palliative Medicine Team 04/05/2017 3:14 PM Office [phone]

## 2017-04-05 NOTE — Progress Notes (Signed)
Patient with labored breathing after getting up to [facility]. Patient diaphoretic and HR sustaining in 140-150's. RT called for PRN breathing treatment. Oxygen sats 80% of 5 L [place]. Patient producing copious amounts of clear thick secretions. Patient placed on NRB sats maintaining at 94%. Rapid response called to assess patient. NP on call paged. New orders placed. Lasix 40 mg IV given and STAT CXR done. Patient weaned off NRB and back on nasal cannula at 6 L sats 94%. HR in low 100's after Lopressor 2.5 mg IV given. BP stable. Patient seems more comfortable. Will continue to monitor closely.

## 2017-04-05 NOTE — Progress Notes (Signed)
Oncology f/u note  Case discussed with Dr Erlinda Hong. Patient has refused bronchoscopy. Will need goals of care discussion with patient and her daughter to determine further treatment approach. 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  Sullivan Lone MD MS

## 2017-04-05 NOTE — Plan of Care (Signed)
Patient continues to be short of breath with any exertion (moving in bed or adjusting her body at all), per patient through interpreter breathing treatments make this better for a short period of time but then the shortness of breath comes right back.  Patients daughter in to visit with patient this evening, states she will be back after 4:30 p.m. On Friday and will plan on staying overnight to meet with providers and discuss care for her mother.

## 2017-04-05 NOTE — Progress Notes (Signed)
Rapid Response Event Note  Overview:  RR-RN called to room 1410 for patient having sustained HR 130-140 bpm, pt had just been up to the [facility] and now requiring NRB mask due to having an oxygen saturation of 80%. Patient coughing and having lots of secretions.    Initial Focused Assessment: Pt sitting 90 degrees up in bed, accessory muscle use. Oxygen saturation 89% on 10L NRB, I increased NRB to 15L. Expiratory wheeze audible in upper and lower, right and left lobes.   Interventions: NP ordered 40mg  IV Lasix, CXR, and 2.5 mg Lopressor IV.   Plan of Care (if not transferred): Pt currently has cardiac monitoring, I recommend adding continuous pulse oximetry. RN placed a PureWick so patient would not have to get up out of bed as frequently. RN to continue to monitor patient for signs of distress with clearing of respiratory secretions.   Pt HR now 98 bpm, RR 22, oxygen saturation 94% on 6LNC, and BP 96/76 (84). No accessory muscle use, and patient is now organizing her bed and lines. Lung sounds clear on the right side and clear, diminished on the left.   RN to contact RR-RN for further needs.   Event Summary:  Michelle Mccall

## 2017-04-05 NOTE — Plan of Care (Signed)
  Education: Knowledge of General Education information will improve 04/05/2017 0755 - Progressing by Ashley Murrain, RN   Activity: Risk for activity intolerance will decrease 04/05/2017 0755 - Progressing by Ashley Murrain, RN

## 2017-04-06 ENCOUNTER — Inpatient Hospital Stay (HOSPITAL_COMMUNITY): Payer: Medicaid Other

## 2017-04-06 LAB — BASIC METABOLIC PANEL
ANION GAP: 12 (ref 5–15)
BUN: 14 mg/dL (ref 6–20)
CALCIUM: 9.2 mg/dL (ref 8.9–10.3)
CO2: 27 mmol/L (ref 22–32)
CREATININE: 0.51 mg/dL (ref 0.44–1.00)
Chloride: 91 mmol/L — ABNORMAL LOW (ref 101–111)
GFR calc Af Amer: >60 mL/min (ref >60)
GLUCOSE: 138 mg/dL — AB (ref 65–99)
Potassium: 3.6 mmol/L (ref 3.5–5.1)
Sodium: 130 mmol/L — ABNORMAL LOW (ref 135–145)

## 2017-04-06 LAB — GLUCOSE, CAPILLARY
Glucose-Capillary: 129 mg/dL — ABNORMAL HIGH (ref 65–99)
Glucose-Capillary: 157 mg/dL — ABNORMAL HIGH (ref 65–99)
Glucose-Capillary: 125 mg/dL — ABNORMAL HIGH (ref 65–99)
Glucose-Capillary: 140 mg/dL — ABNORMAL HIGH (ref 65–99)

## 2017-04-06 LAB — CBC WITH DIFFERENTIAL/PLATELET
BASOS ABS: 0 10*3/uL (ref 0.0–0.1)
Basophils Relative: 0 %
EOS PCT: 0 %
Eosinophils Absolute: 0 10*3/uL (ref 0.0–0.7)
HCT: 41 % (ref 36.0–46.0)
Hemoglobin: 13.4 g/dL (ref 12.0–15.0)
LYMPHS PCT: 19 %
Lymphs Abs: 2.2 10*3/uL (ref 0.7–4.0)
MCH: 29.6 pg (ref 26.0–34.0)
MCHC: 32.7 g/dL (ref 30.0–36.0)
MCV: 90.5 fL (ref 78.0–100.0)
MONO ABS: 0.8 10*3/uL (ref 0.1–1.0)
Monocytes Relative: 7 %
Neutro Abs: 8.7 10*3/uL — ABNORMAL HIGH (ref 1.7–7.7)
Neutrophils Relative %: 74 %
PLATELETS: 416 10*3/uL — AB (ref 150–400)
RBC: 4.53 MIL/uL (ref 3.87–5.11)
RDW: 14.4 % (ref 11.5–15.5)
WBC: 11.8 10*3/uL — ABNORMAL HIGH (ref 4.0–10.5)

## 2017-04-06 LAB — MAGNESIUM: Magnesium: 1.7 mg/dL (ref 1.7–2.4)

## 2017-04-06 MED ORDER — LEVALBUTEROL HCL 1.25 MG/0.5ML IN NEBU
1.2500 mg | INHALATION_SOLUTION | Freq: Four times a day (QID) | RESPIRATORY_TRACT | Status: DC
  Administered 2017-04-06 – 2017-04-10 (×17): 1.25 mg via RESPIRATORY_TRACT
  Filled 2017-04-06 (×17): qty 0.5

## 2017-04-06 MED ORDER — AMOXICILLIN-POT CLAVULANATE 875-125 MG PO TABS
1.0000 | ORAL_TABLET | Freq: Two times a day (BID) | ORAL | Status: DC
  Administered 2017-04-06 – 2017-04-10 (×8): 1 via ORAL
  Filled 2017-04-06 (×8): qty 1

## 2017-04-06 MED ORDER — FUROSEMIDE 10 MG/ML IJ SOLN
40.0000 mg | Freq: Once | INTRAMUSCULAR | Status: AC
  Administered 2017-04-06: 40 mg via INTRAVENOUS
  Filled 2017-04-06: qty 4

## 2017-04-06 MED ORDER — FUROSEMIDE 10 MG/ML IJ SOLN
40.0000 mg | Freq: Every day | INTRAMUSCULAR | Status: AC
  Administered 2017-04-07: 40 mg via INTRAVENOUS
  Filled 2017-04-06 (×2): qty 4

## 2017-04-06 MED ORDER — IPRATROPIUM-ALBUTEROL 0.5-2.5 (3) MG/3ML IN SOLN
3.0000 mL | Freq: Four times a day (QID) | RESPIRATORY_TRACT | Status: DC
  Administered 2017-04-06: 3 mL via RESPIRATORY_TRACT
  Filled 2017-04-06: qty 3

## 2017-04-06 MED ORDER — METOPROLOL TARTRATE 5 MG/5ML IV SOLN
2.5000 mg | Freq: Four times a day (QID) | INTRAVENOUS | Status: DC | PRN
  Administered 2017-04-06 – 2017-04-08 (×3): 2.5 mg via INTRAVENOUS
  Filled 2017-04-06 (×2): qty 5

## 2017-04-06 MED ORDER — MAGNESIUM SULFATE 2 GM/50ML IV SOLN
2.0000 g | Freq: Once | INTRAVENOUS | Status: AC
  Administered 2017-04-06: 2 g via INTRAVENOUS
  Filled 2017-04-06: qty 50

## 2017-04-06 MED ORDER — IPRATROPIUM BROMIDE 0.02 % IN SOLN
0.5000 mg | Freq: Four times a day (QID) | RESPIRATORY_TRACT | Status: DC
  Administered 2017-04-06 – 2017-04-10 (×17): 0.5 mg via RESPIRATORY_TRACT
  Filled 2017-04-06 (×17): qty 2.5

## 2017-04-06 NOTE — Progress Notes (Signed)
Patient with labored breathing. Oxygen sats 88% on 6 L Michelle Mccall. Crackles heard in the upper lobes. BP 100/71, HR sustaining in 140's. Patient placed on NRB. Sats 98% on NRB. Patient with minimal urine output through the night. NP on call notified. New orders placed.

## 2017-04-06 NOTE — Progress Notes (Signed)
PROGRESS NOTE  Michelle Mccall ATF:[id number] DOB: 07-Mar-1940 DOA: 04/02/2017 PCP: Patient, No Pcp Per  HPI/Recap of past 24 hours:  Had significant dyspnea , was put on NRB last night, stat cxr with persistent pleural effusion She received lasix x1 last night, currently on 6liter constant productive cough Intermittent sinus tachycardia No fever  Assessment/Plan: Principal Problem:   Acute on chronic respiratory failure with hypoxia (HCC) Active Problems:   SIRS (systemic inflammatory response syndrome) (HCC)   Multifocal pneumonia   Bilateral pulmonary infiltrates on chest x-ray   Sinus tachycardia   Acute respiratory failure with hypoxia (HCC)   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  on presentation -she was Started empirically on IV Rocephin and azithro due to concerning for pneumonia, she had a mild leukocytosis with a left shift,but no fever,  blood culture no growth, sputum culture Consistent with normal respiratory flora. Iv abx d/ced, she is started on oral augmentin. -she developed worsening of hypoxia on 2/21/-22 night, repeat cxr bilateral pleural effusion, start iv lasix  -case discussed with pulmonology and oncology, cliniallly highly suspicious for BAC, Patient declined  Bronchoscopy. -I have discussed with daughter and patient about diagnostic and therapeutic thoracentesis, they agreed to proceed -palliative care consulted for goals of care.  Sinus tachycardia:Likely reactive. Likely due to Sirs and missed dose of diltiazem,  Echo lvef wnl with mild pericardioeffusion on 2/19 She has no lower extremity edema.   Diabetes mellitus type 2 : continue sliding scale insulin.  hyponatremia: Component of SIADH?  Sodium 127 on presentation, today  87 Daughter also report patient is afraid of drinking water, clinically she is dehydrated, though there is pleural effusion on cxr.   DVT prophylaxis: Lovenox Disposition Plan/Barrier to D/C: unable to determine, pending palliative care consult   Code Status: full  Family Communication: patient in room,  daughter briefly showed up in the hospital on 2/20 evening, but states has to go Daughter over the phone this  6pm on 2/21,  Daughter in room on 2/22   Consultants:  Case discussed with pulmonology Dr Lake Bells on 2/19 and 2/20  Case discussed with oncology Dr Irene Limbo in person on 2/20  Case discussed with palliative care doctor Rowe Pavy on 2/22  Procedures:  Planned bronchoscopy cancelled due to patient's refusal  Diagnostic and therapeutic thorocentesis  Antibiotics:  Zithro/rocephin from admission to 2/22  augmentin from 2/22 to   Objective: BP 106/80 (BP Location: Right Arm)   Pulse (!) 103   Temp (!) 97.5 F (36.4 C) (Oral)   Resp (!) 24   Ht 4\' 11"  (1.499 m)   Wt 46.3 kg (102 lb 1.2 oz)   SpO2 95%   BMI 20.62 kg/m   Intake/Output Summary (Last 24 hours) at 04/06/2017 1259 Last data filed at 04/06/2017 0600 Gross per 24 hour  Intake 580 ml  Output 200 ml  Net 380 ml   Filed Weights   04/02/17 1958 04/03/17 1000  Weight: 52.6 kg (116 lb) 46.3 kg (102 lb 1.2 oz)    Exam: Patient is examined daily including today on 04/06/2017, exams remain the same as of yesterday except that has changed    General:  Frail, constant productive cough  Cardiovascular: RRR  Respiratory:  wheezing, rhonchi has improved today, diminished at basis  Abdomen: Soft/ND/NT, positive BS  Musculoskeletal:  No Edema  Neuro: alert, oriented   Data Reviewed: Basic Metabolic Panel: Recent Labs  Lab 04/02/17 1838 04/03/17 0031 04/06/17 0459  NA 127* 130* 130*  K 5.6* 4.7 3.6  CL 90* 93* 91*  CO2 26 26 27   GLUCOSE 152* 59* 138*  BUN 25* 21* 14  CREATININE 0.67 0.58  0.51  CALCIUM 9.4 8.7* 9.2  MG  --   --  1.7   Liver Function Tests: Recent Labs  Lab 04/02/17 1838  AST 25  ALT 15  ALKPHOS 62  BILITOT 0.6  PROT 7.9  ALBUMIN 3.2*   No results for input(s): LIPASE, AMYLASE in the last 168 hours. No results for input(s): AMMONIA in the last 168 hours. CBC: Recent Labs  Lab 04/02/17 1838 04/03/17 0031 04/06/17 0459  WBC 12.7* 12.5* 11.8*  NEUTROABS 10.7*  --  8.7*  HGB 13.2 11.1* 13.4  HCT 38.7 33.3* 41.0  MCV 87.0 87.9 90.5  PLT 343 317 416*   Cardiac Enzymes:   No results for input(s): CKTOTAL, CKMB, CKMBINDEX, TROPONINI in the last 168 hours. BNP (last 3 results) Recent Labs    04/02/17 1853  BNP 674.2*    ProBNP (last 3 results) No results for input(s): PROBNP in the last 8760 hours.  CBG: Recent Labs  Lab 04/05/17 1155 04/05/17 1702 04/05/17 2126 04/06/17 0749 04/06/17 1144  GLUCAP 137* 109* 136* 129* 157*    Recent Results (from the past 240 hour(s))  Culture, blood (Routine x 2)     Status: None (Preliminary result)   Collection Time: 04/02/17  6:38 PM  Result Value Ref Range Status   Specimen Description BLOOD LEFT WRIST  Final   Special Requests   Final    BOTTLES DRAWN AEROBIC AND ANAEROBIC Blood Culture adequate volume Performed at [facility] [address] [place], [place] [address]    Culture   Final    NO GROWTH 3 DAYS Performed at [facility], [place] [address] [place], [place] [address]    Report Status PENDING  Incomplete  Culture, sputum-assessment     Status: None   Collection Time: 04/03/17  2:45 AM  Result Value Ref Range Status   Specimen Description SPUTUM  Final   Special Requests NONE  Final   Sputum evaluation   Final    THIS SPECIMEN IS ACCEPTABLE FOR SPUTUM CULTURE Performed at [facility], [place] [address] [place], [place] [address]    Report Status 04/03/2017 FINAL  Final  Culture, respiratory (NON-Expectorated)     Status: None    Collection Time: 04/03/17  2:45 AM  Result Value Ref Range Status   Specimen Description   Final    SPUTUM Performed at [facility] [address] [place], [place] [address]    Special Requests   Final    NONE Reflexed from [id number] Performed at [facility], [place] [address] [place], [place] [address]    Gram Stain   Final    FEW WBC PRESENT, PREDOMINANTLY MONONUCLEAR FEW SQUAMOUS EPITHELIAL CELLS PRESENT FEW YEAST FEW GRAM NEGATIVE RODS FEW GRAM POSITIVE COCCI IN CLUSTERS FEW GRAM POSITIVE RODS    Culture   Final    Consistent with normal respiratory flora. Performed at [facility], [place] [address], [place],  [address]    Report Status 04/05/2017 FINAL  Final     Studies: Dg Chest Port 1 View  Result Date: 04/06/2017 CLINICAL DATA:  Increasing  shortness of breath. EXAM: PORTABLE CHEST 1 VIEW COMPARISON:  Chest radiograph April 05, 2017 FINDINGS: Diffuse interstitial prominence most conspicuous in mid and lower lung zones with alveolar airspace opacities. At least moderate pleural effusions. Relative obscuration of the cardiac silhouette. Mediastinal silhouette is non suspicious. No pneumothorax. Soft tissue planes and included osseous structures are unchanged. IMPRESSION: Stable interstitial and alveolar airspace opacities favoring pulmonary edema though there may be a component of pneumonia. Stable probable moderate pleural effusions. Electronically Signed   By: Elon Alas M.D.   On: 04/06/2017 04:50    Scheduled Meds: . amoxicillin-clavulanate  1 tablet Oral Q12H  . enoxaparin (LOVENOX) injection  40 mg Subcutaneous Q24H  . famotidine  20 mg Oral Daily  . furosemide  40 mg Intravenous Daily  . guaiFENesin  600 mg Oral BID  . insulin aspart  0-9 Units Subcutaneous TID WC  . ipratropium  0.5 mg Nebulization Q6H  . levalbuterol  1.25 mg Nebulization Q6H  . mometasone-formoterol  2 puff Inhalation BID     Continuous Infusions:    Time spent: 50mins, more than 50% time spent on coordination of care I have personally reviewed and interpreted on  04/06/2017 daily labs, tele strips, imagings as discussed above under date review session and assessment and plans.  I reviewed all nursing notes, pharmacy notes, consultant notes,  vitals, pertinent old records  I have discussed plan of care as described above with RN , patient and family on 04/06/2017   Florencia Reasons MD, PhD  [facility] Pager [phone]. If 7PM-7AM, please contact night-coverage at [email or web address], password [facility] 04/06/2017, 12:59 PM  LOS: 4 days

## 2017-04-06 NOTE — Plan of Care (Signed)
  Clinical Measurements: Respiratory complications will improve 04/06/2017 2956 - Not Progressing by Ashley Murrain, RN 04/06/2017 [phone] - Not Progressing by Ashley Murrain, RN   Patient's  sob with activity and oxygen requirement increasing

## 2017-04-06 NOTE — Plan of Care (Deleted)
Patient's oxygen requirement increasing

## 2017-04-06 NOTE — Consult Note (Signed)
Consultation Note Date: 04/06/2017   Patient Name: Michelle Mccall  DOB: 09-27-1940  MRN: [id number]  Age / Sex: 77 y.o., female  PCP: Patient, No Pcp Per Referring Physician: Florencia Reasons, MD  Reason for Consultation: Establishing goals of care  HPI/Patient Profile: 77 y.o. female    admitted on 04/02/2017    Clinical Assessment and Goals of Care:  77 year old lady who speaks primarily [place], daughter is next of kin, has been admitted with acute on chronic respiratory failure, hypoxic respiratory failure, bilateral pulmonary infiltrates on chest x-ray. The patient has been seen by pulmonology and oncology also briefly. Clinical picture is reported to be highly suspicious for bronchoalveolar carcinoma. Patient reportedly underwent a bronchoscopy in a previous hospitalization that was possibly inconclusive, possibly alluded to adenocarcinoma. Patient has had multiple hospitalizations. Daughter states that the patient was also hospitalized at [facility] in [place], [place] are not too long ago. Additionally, the patient was taken to [place] by her daughter and also underwent hospitalization over there and evaluation. She was ruled out for tuberculosis as per the daughter. Patient is continues to have shortness of breath into excessive cough and phlegm production necessitating continuous use of bedside to suction. Patient has not been able to sleep well. Off and on, the patient has had increased supplemental oxygen requirements because of excessive secretions and shortness of breath. However, the patient has refused to undergo another bronchoscopy.  Palliative consultation has been requested for goals of care discussions.  I met with the patient as well as her daughter at the bedside. Dr. Rigoberto Noel from hospital medicine service was also present. We discussed about acute medical issues pertaining to the patient's  current hospitalization.  Both the patient and her daughter state that they would not want to undergo malignancy workup. It would not want the patient to undergo another bronchoscopy. Patient's daughter states that her primary concern is whether or not the patient has fluid around her lungs. As the patient has a lot of fluid around her lungs, the patient's daughter would like for the fluid to be removed.  Broadly, goals wishes and values discussed. Overall, goals remain for continuation of life maintenance/life-prolonging and. To that end, agent and daughter are not opposed to use off intubation and mechanical ventilation if need be. However, patient's daughter is very suspicious about hospital course, medications being given, blood work being ordered, and request for written paperwork material regarding tests and procedures order for the patient. She has consented to imaging of the lungs to look at whether or not the patient has a pleural effusion that can be drained at least as a symptom management measure.  Additionally, patient's daughter is asking whether or not the patient needs to be on Coumadin. Reportedly, the patient was on Coumadin when she was in [place] as well as when she was hospitalized at [facility] in [place], [place] a few months ago.  See recommendations below. PMT to continue to follow along to help establish a trust relationship with patient and daughter  and to help guide appropriate decision-making. Thank you for the consult.  NEXT OF KIN  daughter Argentina Ponder who lives in [place], [place].  Patient is from [place], she has a son who lives in [place], several extended family members who live in [place].  Patient on/off has been in the [place] for 10 years now according to daughter.   SUMMARY OF RECOMMENDATIONS    Full code. Patient and daughter are not opposed to intubation/mechanical ventilation if needed.  Patient's daughter wishes to speak with Pulmonology colleagues this  weekend if possible Daughter is also requesting cardiology input, she has questions about whether or not the patient needs to be on oral anti coagulation.  Patient and daughter both do not consent to Bronchoscopy at this time. Additionally,daughter does not want patient to be evaluated by medical oncology.  PMT to continue gentle conversations, work on establishing trust with patient and daughter.  Continue current mode of care for now.  Thank you for the consult.   Code Status/Advance Care Planning:  Full code    Symptom Management:    as above   Palliative Prophylaxis:   Bowel Regimen  Additional Recommendations (Limitations, Scope, Preferences):  Full Scope Treatment  Psycho-social/Spiritual:   Desire for further Chaplaincy support:yes  Additional Recommendations: Caregiving  Support/Resources  Prognosis:   Unable to determine  Discharge Planning: To Be Determined      Primary Diagnoses: Present on Admission: . Multifocal pneumonia . Acute on chronic respiratory failure with hypoxia ([place]) . Bilateral pulmonary infiltrates on chest x-ray . Sinus tachycardia . Acute respiratory failure with hypoxia ([place])   I have reviewed the medical record, interviewed the patient and family, and examined the patient. The following aspects are pertinent.  Past Medical History:  Diagnosis Date  . Chronic a-fib ([place])   . Chronic bronchitis ([place])   . DM2 (diabetes mellitus, type 2) ([place])   . High cholesterol    Social History   Socioeconomic History  . Marital status: Widowed    Spouse name: None  . Number of children: None  . Years of education: None  . Highest education level: None  Social Needs  . Financial resource strain: None  . Food insecurity - worry: None  . Food insecurity - inability: None  . Transportation needs - medical: None  . Transportation needs - non-medical: None  Occupational History  . None  Tobacco Use  . Smoking status: Never Smoker  .  Smokeless tobacco: Never Used  Substance and Sexual Activity  . Alcohol use: No    Frequency: Never  . Drug use: No  . Sexual activity: None  Other Topics Concern  . None  Social History Narrative  . None   Family History  Family history unknown: Yes   Scheduled Meds: . amoxicillin-clavulanate  1 tablet Oral Q12H  . enoxaparin (LOVENOX) injection  40 mg Subcutaneous Q24H  . famotidine  20 mg Oral Daily  . furosemide  40 mg Intravenous Daily  . guaiFENesin  600 mg Oral BID  . insulin aspart  0-9 Units Subcutaneous TID WC  . ipratropium  0.5 mg Nebulization Q6H  . levalbuterol  1.25 mg Nebulization Q6H  . mometasone-formoterol  2 puff Inhalation BID   Continuous Infusions: PRN Meds:.acetaminophen, albuterol, benzonatate, metoprolol tartrate Medications Prior to Admission:  Prior to Admission medications   Medication Sig Start Date End Date Taking? Authorizing Provider  albuterol (PROVENTIL HFA;VENTOLIN HFA) 108 (90 Base) MCG/ACT inhaler Inhale 2 puffs into the lungs every 4 (four)  hours as needed for wheezing or shortness of breath.   Yes [provider]  benzonatate (TESSALON) 100 MG capsule Take 100 mg by mouth 3 (three) times daily as needed for cough.   Yes [provider]  budesonide-formoterol (SYMBICORT) 160-4.5 MCG/ACT inhaler Inhale 2 puffs into the lungs 2 (two) times daily.   Yes [provider]  diltiazem (DILTIAZEM CD) 180 MG 24 hr capsule Take 180 mg by mouth daily.   Yes [provider]  Fluticasone-Salmeterol (ADVAIR) 250-50 MCG/DOSE AEPB Inhale 1 puff into the lungs 2 (two) times daily.   Yes [provider]  furosemide (LASIX) 40 MG tablet Take 40 mg by mouth daily as needed (Swelling).   Yes [provider]  levofloxacin (LEVAQUIN) 750 MG tablet Take 750 mg by mouth daily.   Yes [provider]  promethazine-dextromethorphan (PROMETHAZINE-DM) 6.25-15 MG/5ML syrup Take 5 mLs by mouth 4 (four) times  daily as needed for cough.   Yes [provider]  ranitidine (ZANTAC) 150 MG tablet Take 150 mg by mouth 2 (two) times daily as needed for heartburn.   Yes [provider]   Allergies  Allergen Reactions  . Prednisone    Review of Systems +cough +excessive secretions +weakness  Physical Exam Frail elderly lady Cough+ Coarse breath sounds S1 S2 Abdomen soft No edema Awake alert Is able to speak with daughter in [place]  Vital Signs: BP 92/65 (BP Location: Left Arm)   Pulse 89   Temp 97.8 F (36.6 C) (Oral)   Resp 20   Ht _0  (1.499 m)   Wt 46.3 kg (102 lb 1.2 oz)   SpO2 95%   BMI 20.62 kg/m  Pain Assessment: 0-10   Pain Score: 0-No pain   SpO2: SpO2: 95 % O2 Device:SpO2: 95 % O2 Flow Rate: .O2 Flow Rate (L/min): 6 L/min  IO: Intake/output summary:   Intake/Output Summary (Last 24 hours) at 04/06/2017 1656 Last data filed at 04/06/2017 1330 Gross per 24 hour  Intake 580 ml  Output 400 ml  Net 180 ml    LBM: Last BM Date: 04/05/17 Baseline Weight: Weight: 52.6 kg (116 lb) Most recent weight: Weight: 46.3 kg (102 lb 1.2 oz)     Palliative Assessment/Data:   PPS 40%  Time In:  1557 Time Out:  1657 Time Total:  60 min  Greater than 50%  of this time was spent counseling and coordinating care related to the above assessment and plan.  Signed by: Loistine Chance, MD  [phone]  Please contact Palliative Medicine Team phone at [phone] for questions and concerns.  For individual provider: See Shea Evans

## 2017-04-07 ENCOUNTER — Inpatient Hospital Stay (HOSPITAL_COMMUNITY): Payer: Medicaid Other

## 2017-04-07 DIAGNOSIS — L8990 Pressure ulcer of unspecified site, unspecified stage: Secondary | ICD-10-CM

## 2017-04-07 LAB — GLUCOSE, CAPILLARY
GLUCOSE-CAPILLARY: 128 mg/dL — AB (ref 65–99)
GLUCOSE-CAPILLARY: 137 mg/dL — AB (ref 65–99)
Glucose-Capillary: 247 mg/dL — ABNORMAL HIGH (ref 65–99)
Glucose-Capillary: 117 mg/dL — ABNORMAL HIGH (ref 65–99)

## 2017-04-07 LAB — CULTURE, BLOOD (ROUTINE X 2)
Culture: NO GROWTH
Special Requests: ADEQUATE

## 2017-04-07 MED ORDER — GLYCOPYRROLATE 1 MG PO TABS
1.0000 mg | ORAL_TABLET | Freq: Two times a day (BID) | ORAL | Status: DC
  Administered 2017-04-07 – 2017-04-10 (×7): 1 mg via ORAL
  Filled 2017-04-07 (×8): qty 1

## 2017-04-07 MED ORDER — LIDOCAINE HCL 1 % IJ SOLN
INTRAMUSCULAR | Status: AC
  Filled 2017-04-07: qty 20

## 2017-04-07 NOTE — Progress Notes (Signed)
PMT progress note  Patient is awake alert resting in bed Daughter at bedside Patient with ongoing cough, congestion. Patient to go for thoracentesis this am, daughter has been given written information about thoracentesis. She states that she has reviewed this and agrees with the need for thoracentesis.   BP 99/81 (BP Location: Left Arm)   Pulse (!) 101   Temp 98 F (36.7 C) (Oral)   Resp (!) 28   Ht 4\' 11"  (1.499 m)   Wt 46.3 kg (102 lb 1.2 oz)   SpO2 92%   BMI 20.62 kg/m  Labs and imaging med history noted.   Awake alert in no distress Has generalized weakness No edema Non focal Answers in [place], daughter translates Does not appear dyspneic currently.   Continue to monitor trajectory, await pleural fluid studies results.  Continue current mode of care, continue efforts to build a trust relationship with patient/daughter.  No additional PMT specific recommendations at this time 15 minutes spent Loistine Chance MD [facility] health palliative medicine team [phone]

## 2017-04-07 NOTE — Progress Notes (Addendum)
PROGRESS NOTE  Michelle Mccall GMW:[id number] DOB: Aug 05, 1940 DOA: 04/02/2017 PCP: Patient, No Pcp Per  HPI/Recap of past 24 hours:  She did not have thoracentesis due to not enough fluid to tap, he returned to room constant productive cough Intermittent sinus tachycardia No fever Daughter at bedside  Assessment/Plan: Principal Problem:   Acute on chronic respiratory failure with hypoxia (HCC) Active Problems:   SIRS (systemic inflammatory response syndrome) (HCC)   Multifocal pneumonia   Bilateral pulmonary infiltrates on chest x-ray   Sinus tachycardia   Acute respiratory failure with hypoxia (HCC)   Malignant neoplasm of bronchus and lung (HCC)    Acute on chronic respiratory failure with hypoxia (HCC)/  Bilateral pulmonary infiltrates on chest x-ray: -patient was in the hospital in 2018 for the same, she did not follow up with oncology. per daughter,  patient went back to [place] and stayed in the hospital in [place] for 21 days where she was told there is no cancer and if she were to stay in the hospital for one more month, she will get a lot better  -CT scan of the chest this hospitalization showed bilateral asymmetrical infiltrates,Influence of PCR negative.  -echocardiogram lvef wnl, A trivial pericardial effusion was identified. -she was Started empirically on IV Rocephin and azithro due to concerning for pneumonia, she had a mild leukocytosis with a left shift,but no fever,  blood culture no growth, sputum culture Consistent with normal respiratory flora. Iv abx d/ced, she is started on oral augmentin. -case discussed with pulmonology and oncology, cliniallly highly suspicious for BAC, Patient declined  Bronchoscopy. Patient and family do not want oncology to be involved, they request pulmonology and cardiology consult. -she developed worsening of hypoxia on 2/21/-22 night,  she received iv lasix, not much pleural effusion by [place], planned diagnostic/therapeutic  thoracentesis aborted. Her blood pressure is too low to receive lasix.  -Case discussed with critical care due to increase oxygen requirement and sinus tachycardia, Dr Elsworth Soho recommended robinol for now, unfortunately if patient remains full code, she might eventually need intubation. -palliative care consulted for goals of care.  Sinus tachycardia:Likely reactive. Likely due to Sirs and missed dose of diltiazem,  Echo lvef wnl with mild pericardioeffusion on 2/19 She has no lower extremity edema. Clinically dry   Diabetes mellitus type 2 : continue sliding scale insulin.  hyponatremia: Component of SIADH?  Sodium 127 on presentation, today 31 Daughter also report patient is afraid of drinking water, clinically she is dehydrated.   DVT prophylaxis: Lovenox Disposition Plan/Barrier to D/C: unable to determine, pending palliative care consult   Code Status: full  Family Communication: patient in room,  daughter briefly showed up in the hospital on 2/20 evening, but states has to go Daughter over the phone this  6pm on 2/21,  Daughter in room on 2/22, 2/23   Consultants:  Case discussed with pulmonology Dr Lake Bells on 2/19 and 2/20, case discussed with critical care on 2/23 due to increased oxygen requirement  Case discussed with oncology Dr Irene Limbo in person on 2/20  Case discussed with palliative care doctor Rowe Pavy on 2/22  Procedures:  Planned bronchoscopy cancelled due to patient's refusal  Diagnostic and therapeutic thoracentesis aborted  Antibiotics:  Zithro/rocephin from admission to 2/22  augmentin from 2/22 to   Objective: BP 93/79 (BP Location: Right Arm)   Pulse (!) 101   Temp 98 F (36.7 C) (Oral)   Resp (!) 28   Ht 4\' 11"  (1.499 m)   Wt 46.3 kg (  102 lb 1.2 oz)   SpO2 94%   BMI 20.62 kg/m   Intake/Output Summary (Last 24 hours) at 04/07/2017 0805 Last data filed at 04/07/2017 0208 Gross per 24 hour  Intake 50 ml  Output 500 ml  Net -450 ml    Filed Weights   04/02/17 1958 04/03/17 1000  Weight: 52.6 kg (116 lb) 46.3 kg (102 lb 1.2 oz)    Exam: Patient is examined daily including today on 04/07/2017, exams remain the same as of yesterday except that has changed    General:  Frail, constant productive cough  Cardiovascular: sinus tachycardia  Respiratory:  wheezing, rhonchi has improved today, diminished at basis  Abdomen: Soft/ND/NT, positive BS  Musculoskeletal: No Edema  Neuro: alert, oriented   Data Reviewed: Basic Metabolic Panel: Recent Labs  Lab 04/02/17 1838 04/03/17 0031 04/06/17 0459  NA 127* 130* 130*  K 5.6* 4.7 3.6  CL 90* 93* 91*  CO2 26 26 27   GLUCOSE 152* 59* 138*  BUN 25* 21* 14  CREATININE 0.67 0.58 0.51  CALCIUM 9.4 8.7* 9.2  MG  --   --  1.7   Liver Function Tests: Recent Labs  Lab 04/02/17 1838  AST 25  ALT 15  ALKPHOS 62  BILITOT 0.6  PROT 7.9  ALBUMIN 3.2*   No results for input(s): LIPASE, AMYLASE in the last 168 hours. No results for input(s): AMMONIA in the last 168 hours. CBC: Recent Labs  Lab 04/02/17 1838 04/03/17 0031 04/06/17 0459  WBC 12.7* 12.5* 11.8*  NEUTROABS 10.7*  --  8.7*  HGB 13.2 11.1* 13.4  HCT 38.7 33.3* 41.0  MCV 87.0 87.9 90.5  PLT 343 317 416*   Cardiac Enzymes:   No results for input(s): CKTOTAL, CKMB, CKMBINDEX, TROPONINI in the last 168 hours. BNP (last 3 results) Recent Labs    04/02/17 1853  BNP 674.2*    ProBNP (last 3 results) No results for input(s): PROBNP in the last 8760 hours.  CBG: Recent Labs  Lab 04/06/17 0749 04/06/17 1144 04/06/17 1733 04/06/17 2210 04/07/17 0741  GLUCAP 129* 157* 125* 140* 128*    Recent Results (from the past 240 hour(s))  Culture, blood (Routine x 2)     Status: None (Preliminary result)   Collection Time: 04/02/17  6:38 PM  Result Value Ref Range Status   Specimen Description BLOOD LEFT WRIST  Final   Special Requests   Final    BOTTLES DRAWN AEROBIC AND ANAEROBIC Blood Culture  adequate volume Performed at [facility] [address] [place], [place] [address]    Culture   Final    NO GROWTH 4 DAYS Performed at [facility], [place] [address] [place], [place] [address]    Report Status PENDING  Incomplete  Culture, sputum-assessment     Status: None   Collection Time: 04/03/17  2:45 AM  Result Value Ref Range Status   Specimen Description SPUTUM  Final   Special Requests NONE  Final   Sputum evaluation   Final    THIS SPECIMEN IS ACCEPTABLE FOR SPUTUM CULTURE Performed at [facility], [place] [address], [place], [place] [address]    Report Status 04/03/2017 FINAL  Final  Culture, respiratory (NON-Expectorated)     Status: None   Collection Time: 04/03/17  2:45 AM  Result Value Ref Range Status   Specimen Description   Final    SPUTUM Performed at [facility] [address] [place], [place]  [address]    Special Requests   Final    NONE Reflexed from [id number] Performed at [facility], [place] [address], [place], [place] [address]    Gram Stain   Final    FEW WBC PRESENT, PREDOMINANTLY MONONUCLEAR FEW SQUAMOUS EPITHELIAL CELLS PRESENT FEW YEAST FEW GRAM NEGATIVE RODS FEW GRAM POSITIVE COCCI IN CLUSTERS FEW GRAM POSITIVE RODS    Culture   Final    Consistent with normal respiratory flora. Performed at [facility], [place] [address], [place],  [address]    Report Status 04/05/2017 FINAL  Final     Studies: No results found.  Scheduled Meds: . amoxicillin-clavulanate  1 tablet Oral Q12H  . enoxaparin (LOVENOX) injection  40 mg Subcutaneous Q24H  . famotidine  20 mg Oral Daily  . furosemide  40 mg Intravenous Daily  . guaiFENesin  600 mg Oral BID  . insulin aspart  0-9 Units Subcutaneous TID WC  . ipratropium  0.5 mg Nebulization Q6H  . levalbuterol  1.25 mg Nebulization Q6H  . mometasone-formoterol  2 puff Inhalation BID    Continuous  Infusions:    Time spent: 72mins, more than 50% time spent on coordination of care I have personally reviewed and interpreted on  04/07/2017 daily labs, tele strips, imagings as discussed above under date review session and assessment and plans.  I reviewed all nursing notes, pharmacy notes, consultant notes,  vitals, pertinent old records  I have discussed plan of care as described above with RN , patient and family on 04/07/2017   Florencia Reasons MD, PhD  [facility] Pager [phone]. If 7PM-7AM, please contact night-coverage at [email or web address], password [facility] 04/07/2017, 8:05 AM  LOS: 5 days

## 2017-04-07 NOTE — Progress Notes (Signed)
Patient brought to radiology department today for possible thoracentesis.  CXR suggestive of pulmonary edema and possible effusions.   Limited US Chest performed.  There is a very small pocket of fluid on the left chest only.  No pleural fluid identified on the right side.    Lengthy discussion with patient, daughter, and Dr. Erlinda Hong.  This fluid collection may be amenable to drainage, however the vast majority of her symptoms are related to pulmonary edema.  Discussed performing diagnostic only thoracentesis with Dr. Erlinda Hong.  Decision was may to hold on procedure for now given risk vs. Benefit ratio at this time.   Patient returned to unit. No procedure performed.   Brynda Greathouse, MS RD PA-C 12:08 PM

## 2017-04-08 LAB — GLUCOSE, CAPILLARY
GLUCOSE-CAPILLARY: 133 mg/dL — AB (ref 65–99)
GLUCOSE-CAPILLARY: 232 mg/dL — AB (ref 65–99)
GLUCOSE-CAPILLARY: 145 mg/dL — AB (ref 65–99)
Glucose-Capillary: 118 mg/dL — ABNORMAL HIGH (ref 65–99)

## 2017-04-08 NOTE — Progress Notes (Signed)
PROGRESS NOTE  Michelle Mccall PQA:[id number] DOB: [birth year] DOA: 04/02/2017 PCP: Patient, No Pcp Per  HPI/Recap of past 24 hours:  constant productive cough Intermittent sinus tachycardia No fever Daughter at bedside  Assessment/Plan: Principal Problem:   Acute on chronic respiratory failure with hypoxia (HCC) Active Problems:   SIRS (systemic inflammatory response syndrome) (HCC)   Multifocal pneumonia   Bilateral pulmonary infiltrates on chest x-ray   Sinus tachycardia   Acute respiratory failure with hypoxia (HCC)   Malignant neoplasm of bronchus and lung (HCC)   Pressure injury of skin    Acute on chronic respiratory failure with hypoxia (HCC)/  Bilateral pulmonary infiltrates on chest x-ray: -patient was in the hospital in 2018 for the same, she did not follow up with oncology. per daughter,  patient went back to [place] and stayed in the hospital in [place] for 21 days where she was told there is no cancer and if she were to stay in the hospital for one more month, she will get a lot better  -CT scan of the chest this hospitalization showed bilateral asymmetrical infiltrates,Influence of PCR negative.  -echocardiogram lvef wnl, A trivial pericardial effusion was identified. -she was Started empirically on IV Rocephin and azithro due to concerning for pneumonia, she had a mild leukocytosis with a left shift,but no fever,  blood culture no growth, sputum culture Consistent with normal respiratory flora. Iv abx d/ced, she is started on oral augmentin. -case discussed with pulmonology and oncology, cliniallly highly suspicious for BAC, Patient declined  Bronchoscopy. Patient and family do not want oncology to be involved, they request pulmonology and cardiology consult. -she developed worsening of hypoxia on 2/21/-22 night,  she received iv lasix, not much pleural effusion by [place], planned diagnostic/therapeutic thoracentesis aborted. Her blood pressure is too low to receive  lasix.  -Case discussed with critical care due to increase oxygen requirement and sinus tachycardia, Dr Elsworth Soho recommended robinol for now, unfortunately if patient remains full code, she might eventually need intubation. -palliative care consulted for goals of care.  Sinus tachycardia:Likely reactive. Likely due to Sirs and missed dose of diltiazem,  Echo lvef wnl with mild pericardioeffusion on 2/19 She has no lower extremity edema. Clinically dry bp soft, on prn lopressor as tolerated.   Diabetes mellitus type 2 : continue sliding scale insulin.  hyponatremia: Component of SIADH?  Sodium 127 on presentation, today 80 Daughter also report patient is afraid of drinking water, clinically she is dehydrated.   DVT prophylaxis: Lovenox Disposition Plan/Barrier to D/C: unable to determine, pending palliative care consult   Code Status: full  Family Communication: patient in room,  daughter briefly showed up in the hospital on 2/20 evening, but states has to go Daughter over the phone this  6pm on 2/21,  Daughter in room on 2/22, 2/23, 2/24 Daughter states able to come to the hospital at 4:30pm on 2/25 for family meeting.   Consultants:  Case discussed with pulmonology Dr Lake Bells on 2/19 and 2/20, case discussed with critical care on 2/23 due to increased oxygen requirement  Case discussed with oncology Dr Irene Limbo in person on 2/20  Case discussed with palliative care doctor Rowe Pavy on 2/22  Procedures:  Planned bronchoscopy cancelled due to patient's refusal  Diagnostic and therapeutic thoracentesis aborted  Antibiotics:  Zithro/rocephin from admission to 2/22  augmentin from 2/22 to   Objective: BP 98/80 (BP Location: Right Arm)   Pulse 96   Temp 97.7 F (36.5 C) (Oral)   Resp 18  Ht 4\' 11"  (1.499 m)   Wt 46.3 kg (102 lb 1.2 oz)   SpO2 94%   BMI 20.62 kg/m   Intake/Output Summary (Last 24 hours) at 04/08/2017 0801 Last data filed at 04/08/2017 0600 Gross  per 24 hour  Intake 1080 ml  Output 650 ml  Net 430 ml   Filed Weights   04/02/17 1958 04/03/17 1000  Weight: 52.6 kg (116 lb) 46.3 kg (102 lb 1.2 oz)    Exam: Patient is examined daily including today on 04/08/2017, exams remain the same as of yesterday except that has changed    General:  Frail, constant productive cough  Cardiovascular: sinus tachycardia  Respiratory:  wheezing, rhonchi has improved today, diminished at basis  Abdomen: Soft/ND/NT, positive BS  Musculoskeletal: No Edema  Neuro: alert, oriented   Data Reviewed: Basic Metabolic Panel: Recent Labs  Lab 04/02/17 1838 04/03/17 0031 04/06/17 0459  NA 127* 130* 130*  K 5.6* 4.7 3.6  CL 90* 93* 91*  CO2 26 26 27   GLUCOSE 152* 59* 138*  BUN 25* 21* 14  CREATININE 0.67 0.58 0.51  CALCIUM 9.4 8.7* 9.2  MG  --   --  1.7   Liver Function Tests: Recent Labs  Lab 04/02/17 1838  AST 25  ALT 15  ALKPHOS 62  BILITOT 0.6  PROT 7.9  ALBUMIN 3.2*   No results for input(s): LIPASE, AMYLASE in the last 168 hours. No results for input(s): AMMONIA in the last 168 hours. CBC: Recent Labs  Lab 04/02/17 1838 04/03/17 0031 04/06/17 0459  WBC 12.7* 12.5* 11.8*  NEUTROABS 10.7*  --  8.7*  HGB 13.2 11.1* 13.4  HCT 38.7 33.3* 41.0  MCV 87.0 87.9 90.5  PLT 343 317 416*   Cardiac Enzymes:   No results for input(s): CKTOTAL, CKMB, CKMBINDEX, TROPONINI in the last 168 hours. BNP (last 3 results) Recent Labs    04/02/17 1853  BNP 674.2*    ProBNP (last 3 results) No results for input(s): PROBNP in the last 8760 hours.  CBG: Recent Labs  Lab 04/07/17 0741 04/07/17 1217 04/07/17 1723 04/07/17 2148 04/08/17 0756  GLUCAP 128* 247* 137* 117* 133*    Recent Results (from the past 240 hour(s))  Culture, blood (Routine x 2)     Status: None   Collection Time: 04/02/17  6:38 PM  Result Value Ref Range Status   Specimen Description BLOOD LEFT WRIST  Final   Special Requests   Final    BOTTLES DRAWN  AEROBIC AND ANAEROBIC Blood Culture adequate volume Performed at [facility] [address], [place], [place] [address]    Culture   Final    NO GROWTH 5 DAYS Performed at [facility], [place] [address] [place], [place] [address]    Report Status 04/07/2017 FINAL  Final  Culture, sputum-assessment     Status: None   Collection Time: 04/03/17  2:45 AM  Result Value Ref Range Status   Specimen Description SPUTUM  Final   Special Requests NONE  Final   Sputum evaluation   Final    THIS SPECIMEN IS ACCEPTABLE FOR SPUTUM CULTURE Performed at [facility], [place] [address] [place], [place] [address]    Report Status 04/03/2017 FINAL  Final  Culture, respiratory (NON-Expectorated)     Status: None   Collection Time: 04/03/17  2:45 AM  Result Value Ref Range Status   Specimen Description   Final    SPUTUM Performed at [facility]  [place] [address], [place], [place] [address]    Special Requests   Final    NONE Reflexed from [id number] Performed at [facility], [place] [address], [place], [place] [address]    Gram Stain   Final    FEW WBC PRESENT, PREDOMINANTLY MONONUCLEAR FEW SQUAMOUS EPITHELIAL CELLS PRESENT FEW YEAST FEW GRAM NEGATIVE RODS FEW GRAM POSITIVE COCCI IN CLUSTERS FEW GRAM POSITIVE RODS    Culture   Final    Consistent with normal respiratory flora. Performed at [facility], [place] [address], [place],  [address]    Report Status 04/05/2017 FINAL  Final     Studies: [place] Chest (pleural Effusion)  Result Date: 04/07/2017 CLINICAL DATA:  Admitted with concern for multifocal pneumonia. Please perform chest ultrasound and thoracentesis as indicated. EXAM: CHEST ULTRASOUND COMPARISON:  04/06/2017; 04/02/2017; chest CT-04/02/2017 FINDINGS: Sonographic evaluation demonstrates a small left-sided pleural effusion however given patient's vigorous coughing, the effusion is felt to be too small  to allow for safe ultrasound-guided thoracentesis. This was discussed with referring clinician, Dr. Erlinda Hong, and the decision was made not to proceed with thoracentesis. IMPRESSION: Small left-sided pleural effusion, too small to allow for safe ultrasound-guided thoracentesis given patient's current clinical state. Electronically Signed   By: Sandi Mariscal M.D.   On: 04/07/2017 11:09    Scheduled Meds: . amoxicillin-clavulanate  1 tablet Oral Q12H  . enoxaparin (LOVENOX) injection  40 mg Subcutaneous Q24H  . famotidine  20 mg Oral Daily  . furosemide  40 mg Intravenous Daily  . glycopyrrolate  1 mg Oral BID  . guaiFENesin  600 mg Oral BID  . insulin aspart  0-9 Units Subcutaneous TID WC  . ipratropium  0.5 mg Nebulization Q6H  . levalbuterol  1.25 mg Nebulization Q6H  . mometasone-formoterol  2 puff Inhalation BID    Continuous Infusions:    Time spent: 61mins I have personally reviewed and interpreted on  04/08/2017 daily labs, tele strips, imagings as discussed above under date review session and assessment and plans.  I reviewed all nursing notes, vitals, pertinent old records  I have discussed plan of care as described above with RN , patient and family on 04/08/2017   Florencia Reasons MD, PhD  [facility] Pager [phone]. If 7PM-7AM, please contact night-coverage at [email or web address], password [facility] 04/08/2017, 8:01 AM  LOS: 6 days

## 2017-04-09 ENCOUNTER — Encounter (HOSPITAL_COMMUNITY): Payer: Self-pay | Admitting: *Deleted

## 2017-04-09 LAB — GLUCOSE, CAPILLARY
GLUCOSE-CAPILLARY: 150 mg/dL — AB (ref 65–99)
GLUCOSE-CAPILLARY: 133 mg/dL — AB (ref 65–99)
GLUCOSE-CAPILLARY: 149 mg/dL — AB (ref 65–99)
Glucose-Capillary: 144 mg/dL — ABNORMAL HIGH (ref 65–99)

## 2017-04-09 MED ORDER — LIDOCAINE 5 % EX PTCH
1.0000 | MEDICATED_PATCH | CUTANEOUS | Status: DC
  Administered 2017-04-09: 1 via TRANSDERMAL
  Filled 2017-04-09 (×2): qty 1

## 2017-04-09 NOTE — Progress Notes (Addendum)
PMT progress note  Patient is awake alert resting in bed Daughter at bedside Patient with ongoing cough, congestion.   Discussed in detail with PCCM colleagues as well as Dr Erlinda Hong from [facility].   BP 96/76 (BP Location: Right Arm)   Pulse 60   Temp 97.9 F (36.6 C) (Oral)   Resp (!) 22   Ht 4\' 11"  (1.499 m)   Wt 46.3 kg (102 lb 1.2 oz)   SpO2 95%   BMI 20.62 kg/m  Labs and imaging med history noted.   Awake alert in no distress Has generalized weakness No edema Non focal Answers in [place], daughter translates Does not appear dyspneic currently.  Ongoing congestion Kyphotic back Has scar from back surgery decades ago Has back pain   Family Meeting: Extensive discussions with patient, daughter about her current clinical conditin Goals wishes and values discussed. PLAN: DNR DNI No surgeries no procedures Do not resume Coumadin, after risk benefit discussions.  Transition towards comfort measures Add Lidoderm patch for back pain Agree with wound care consult for skin breakdown Home with Hospice, will need some medical equipment Case manager consulted.   45 minutes spent [facility] palliative medicine team [phone]

## 2017-04-09 NOTE — Care Management Note (Signed)
Case Management Note  Patient Details  Name: Michelle Mccall MRN: [id number] Date of Birth: Jul 28, 1940  Subjective/Objective:  Noted pulmonary signed off. PMT meeting today-await recc.                  Action/Plan:d/c plan home.   Expected Discharge Date:  04/03/17               Expected Discharge Plan:  Home/Self Care  In-House Referral:     Discharge planning Services  CM Consult  Post Acute Care Choice:    Choice offered to:     DME Arranged:    DME Agency:     HH Arranged:    HH Agency:     Status of Service:     If discussed at [employer], dates discussed:    Additional Comments:  Dessa Phi, RN 04/09/2017, 2:49 PM

## 2017-04-09 NOTE — Consult Note (Signed)
[facility] Nurse wound consult note Reason for Consult: I am consulted today for "irritation". Spoke to patient with the assistance of the interpreter services. Patient states that she "itches" at the back and shoulders above her pain patch.  Additionally, she has a bony protrusion above where she has had back surgery.  Her coccyx is reddened and she is afraid of a bedsore. Wound type:Moisture, pressure Pressure Injury POA: Yes/No/NA Measurement: 5cm x 4cm area of erythema at coccygeal area. Wound QQV:ZDGL Drainage (amount, consistency, odor) None Periwound: intact, dry. Dressing procedure/placement/frequency: Patient has multiple layers of clothing on and has a bath blanket placed over the DermaTherapy therapeutic linen.  She is taught that the linen is therapeutic and she states that she is allergic to it. She requests a medicated salve or cream. Patient complain that she is constipated, needs a laxative.  Also an expectorant to assist her with coughing up mucus. She does not wish to have a cream that is too cold. I tell her that cream will be at room temperature, which may be cool initially on her skin, but that is would warm to body temperature. She is willing to try cream. I will offer Gerhart's Butt cream to her buttocks. Patient is to go home with Hospice. [facility] nursing team will not follow, but will remain available to this patient, the nursing and medical teams.  Please re-consult if needed. Thanks, Maudie Flakes, MSN, RN, [place], Ar[facility] Abbott  Pager# [phone]

## 2017-04-09 NOTE — Progress Notes (Signed)
PULMONARY / CRITICAL CARE MEDICINE   Name: Michelle Mccall MRN: [id number] DOB: 07-14-1940    ADMISSION DATE:  04/02/2017 CONSULTATION DATE: April 03, 2017  REFERRING MD: Dr. Rollene Fare  CHIEF COMPLAINT: Shortness of breath  HISTORY OF PRESENT ILLNESS:   This is a 77 year old female who is [place] speaking only and there is no translator present.  History was obtained from talking to staff who has seen her and reviewing the chart.  Briefly, this patient was previously seen by my partner in August 2018 for a persistent infiltrate in the left lung.  She underwent a bronchoscopy at that time.  Transbronchial biopsies were negative for malignancy but a BAL cytology was worrisome for adenocarcinoma.  Per my discussion with my partner the patient's daughter was informed of the diagnosis and said that she wanted to take her mother home to [place] for treatment.  A consult was placed to the [facility] but the patient did not show up for the appointment.  She returns to the hospital today with cough, shortness of breath.  The patient tells me that she has not been sleeping but that is the extent of our conversation.  Was seen by [place] in August 2018: see Epic MRN [id number]  SUBJECTIVE:  Still has cough and chest pain w/ deep breath on left   VITAL SIGNS: BP 98/75 (BP Location: Right Arm)   Pulse (!) 111   Temp 97.6 F (36.4 C) (Oral)   Resp 20   Ht _0  (1.499 m)   Wt 102 lb 1.2 oz (46.3 kg)   SpO2 94%   BMI 20.62 kg/m   HEMODYNAMICS:    VENTILATOR SETTINGS:    INTAKE / OUTPUT: I/O last 3 completed shifts: In: 1080 [P.O.:1080] Out: 650 [Urine:175; Other:475]  PHYSICAL EXAMINATION:  General 77 year old white female still w/ rhonchus cough  HENT: NCAT. No JVD MMM Pulm: diffuse scattered rhonchi  Card RRR  abd not tender  GU voids  Neuro awake and alert   LABS:  BMET Recent Labs  Lab 04/02/17 1838 04/03/17 0031 04/06/17 0459  NA 127* 130*  130*  K 5.6* 4.7 3.6  CL 90* 93* 91*  CO2 _1 BUN 25* 21* 14  CREATININE 0.67 0.58 0.51  GLUCOSE 152* 59* 138*    Electrolytes Recent Labs  Lab 04/02/17 1838 04/03/17 0031 04/06/17 0459  CALCIUM 9.4 8.7* 9.2  MG  --   --  1.7    CBC Recent Labs  Lab 04/02/17 1838 04/03/17 0031 04/06/17 0459  WBC 12.7* 12.5* 11.8*  HGB 13.2 11.1* 13.4  HCT 38.7 33.3* 41.0  PLT 343 317 416*    Coag's Recent Labs  Lab 04/02/17 1838  INR 0.91    Sepsis Markers Recent Labs  Lab 04/02/17 1851 04/03/17 0031  LATICACIDVEN 1.60  --   PROCALCITON  --  <0.10    ABG No results for input(s): PHART, PCO2ART, PO2ART in the last 168 hours.  Liver Enzymes Recent Labs  Lab 04/02/17 1838  AST 25  ALT 15  ALKPHOS 62  BILITOT 0.6  ALBUMIN 3.2*    Cardiac Enzymes No results for input(s): TROPONINI, PROBNP in the last 168 hours.  Glucose Recent Labs  Lab 04/07/17 2148 04/08/17 0756 04/08/17 1210 04/08/17 1634 04/08/17 2144 04/09/17 0806  GLUCAP 117* 133* 232* 118* 145* 150*    Imaging No results found. Likely adenocarcinoma of the lung, had bronchoscopy in 09/2016, see Epic MRN [id number]  STUDIES:  09/2016 bronchoscopy: transbronchial biopsies negative, BAL cytology highly suggestive of well differentiated adenocarcinoma 03/2017 CT chest > images independently reviewed showing an atypical pattern with groundglass in the base of the right lung, scattered consolidation, likely cavitation in the left lower lobe and groundglass throughout the left lung.  Mucus or endobronchial lesion noted in the left mainstem bronchus.  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  [place] on left w/ small effusion.  Not  any better. Now day # 8 abx  I have told her that this is either cancer or infection and that we treated infection w/ no improvement. We think that this is her cancer but she is not open to seeing oncology. Also offered her repeat bronch to prove this but she does not want it. We have nothing to offer her  Plan Complete abx course Cont oxygen Not enough pleural fluid to impact symptom burden Cont BDs  We will be available if clinically worsens but will sign off  For now   Erick Colace ACNP-BC [place] Pager # [phone] OR # [phone] if no answer  04/09/2017, 10:28 AM

## 2017-04-09 NOTE — Progress Notes (Addendum)
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   patient was in the hospital in 2018 for the same, she did not follow up with oncology.  per chart review patient underwent bronchoscopy in 09/2016 , Transbronchial biopsies were negative for malignancy but a BAL cytology was worrisome for adenocarcinoma.  BRONCHIAL BRUSHING, LLL "There are a few sheets of atypical cells with prominent nucleoli, raising concern for a well differentiated adenocarcinoma."     per daughter, patient went back to [place] and stayed in the hospital in [place] for 21 days where she was told there is no cancer and if she were to stay in the hospital for one more month, she would get a lot better     HPI/Recap of past 24 hours:  constant productive cough Intermittent sinus tachycardia No fever   Assessment/Plan: Principal Problem:   Acute on chronic respiratory failure with hypoxia ([place]) Active Problems:   SIRS (systemic inflammatory response syndrome) (HCC)   Multifocal pneumonia   Bilateral pulmonary infiltrates on chest x-ray   Sinus tachycardia   Acute respiratory failure with hypoxia ([place])   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  -echocardiogram lvef wnl, A trivial pericardial effusion was identified. -she was Started empirically on IV Rocephin and azithro due to concerning for pneumonia, she had a mild leukocytosis with a left shift,but no fever,  blood culture no growth, sputum culture Consistent with normal respiratory flora. procalcitonin less than 0.1.  Iv abx d/ced, she is started on oral augmentin. -case discussed with pulmonology and oncology, cliniallly highly suspicious for BAC, Patient declined repeat Bronchoscopy. Patient and family do not want oncology to be involved, they request pulmonology and cardiology consult. --she developed worsening of hypoxia on 2/21/-22 night,  she received iv lasix, not much pleural effusion by ultrasound, planned diagnostic/therapeutic thoracentesis aborted. Her blood pressure is too low to receive lasix.  - Case discussed with critical care on 2/25. Critical care input appreciated, detail please see critical care note from 2/25 -ongoing goals of care discussion , palliative care input appreciated.   Addendum: family meeting held together with palliative care Dr Answer at bedside with patient , daughter this afternoon. Disease trajectory discussed. Her pulmonary condition is likely from [facility]. Over all prognosis is poor. Patient and daughter wish to focus care on comfort, code status changed to DNR, home with home hospice once able to set up. Hopefully tomorrow.  Sinus tachycardia:Likely reactive. Echo lvef wnl with mild pericardioeffusion on 2/19 She has no lower extremity edema. Clinically dry bp soft, on prn lopressor as tolerated.  Reported history of SVT versus A. Fib Has been having sinus tachycardia since admission, blood pressure too low to tolerate Cardizem or Lopressor.  was on Coumadin, this was discontinued in 10/2016. I have discussed with  Patient and daughter that th risk of coumadin overweight the benefit in her condition. I donot recommend coumadin.  Reported history of diabetes mellitus type 2 : Not on meds at home , on sliding scale insulin in the hospital   hyponatremia: Component of SIADH?  Sodium 127 on presentation, today 46 Daughter also  report patient is afraid of drinking water, clinically she is dehydrated.   DVT prophylaxis: Lovenox Disposition Plan/Barrier to D/C: home  with home hospice on 2/26   Code Status: DNR  Family Communication: patient in room,  daughter briefly showed up in the hospital on 2/20 evening, but states has to go Daughter over the phone this  6pm on 2/21,  Daughter in room on 2/22, 2/23, 2/24, 2/25 Daughter states she is available by phone from 11am to 11:30 , she can come to the hospital in the evening during weekdays.    Consultants:  Case discussed with pulmonology Dr [place] Bells on 2/19 and 2/20, case discussed with critical care on 2/23 due to increased oxygen requirement, case discussed with critical care on 2/ 25  Case discussed with oncology Dr Irene Limbo in person on 2/20  Case discussed with palliative care doctor Rowe Pavy daily  Procedures:  Planned bronchoscopy cancelled due to patient's refusal  Diagnostic and therapeutic thoracentesis aborted  Antibiotics:  Zithro/rocephin from admission to 2/22  augmentin from 2/22 to   Objective: BP 96/76 (BP Location: Right Arm)   Pulse 60   Temp 97.9 F (36.6 C) (Oral)   Resp (!) 22   Ht _0  (1.499 m)   Wt 46.3 kg (102 lb 1.2 oz)   SpO2 95%   BMI 20.62 kg/m   Intake/Output Summary (Last 24 hours) at 04/09/2017 1457 Last data filed at 04/09/2017 0600 Gross per 24 hour  Intake 360 ml  Output -  Net 360 ml   Filed Weights   04/02/17 1958 04/03/17 1000  Weight: 52.6 kg (116 lb) 46.3 kg (102 lb 1.2 oz)    Exam: Patient is examined daily including today on 04/09/2017, exams remain the same as of yesterday except that has changed    General:  Frail, constant productive cough  Cardiovascular: sinus tachycardia  Respiratory:  wheezing, rhonchi has improved today, scattered crackles, diminished at basis  Abdomen: Soft/ND/NT, positive BS  Musculoskeletal: No Edema  Neuro: alert, oriented   Data Reviewed: Basic Metabolic Panel: Recent Labs  Lab 04/02/17 1838 04/03/17 0031 04/06/17 0459  NA 127* 130* 130*  K 5.6* 4.7 3.6  CL 90* 93* 91*  CO2 _1 GLUCOSE 152* 59* 138*  BUN 25* 21* 14  CREATININE 0.67 0.58 0.51  CALCIUM 9.4 8.7* 9.2  MG  --   --  1.7   Liver Function Tests: Recent Labs  Lab 04/02/17 1838  AST 25  ALT 15  ALKPHOS 62  BILITOT 0.6  PROT 7.9  ALBUMIN 3.2*   No results for input(s): LIPASE, AMYLASE in the last 168 hours. No results for input(s): AMMONIA in the last 168 hours. CBC: Recent Labs  Lab 04/02/17 1838 04/03/17 0031 04/06/17 0459  WBC 12.7* 12.5* 11.8*  NEUTROABS 10.7*  --  8.7*  HGB 13.2 11.1* 13.4  HCT 38.7 33.3* 41.0  MCV 87.0 87.9 90.5  PLT 343 317 416*   Cardiac Enzymes:   No results for input(s): CKTOTAL, CKMB, CKMBINDEX, TROPONINI in the last 168 hours. BNP (last 3 results) Recent Labs    09/27/16 1900 04/02/17 1853  BNP 265.9* 674.2*    ProBNP (last 3 results) No results for input(s): PROBNP in the last 8760 hours.  CBG: Recent Labs  Lab 04/08/17 1210 04/08/17 1634 04/08/17 2144 04/09/17 0806 04/09/17 1147  GLUCAP 232* 118* 145* 150* 144*    Recent Results (from the past 240 hour(s))  Culture, blood (Routine x 2)  Status: None   Collection Time: 04/02/17  6:38 PM  Result Value Ref Range Status   Specimen Description BLOOD LEFT WRIST  Final   Special Requests   Final    BOTTLES DRAWN AEROBIC AND ANAEROBIC Blood Culture adequate volume Performed at [facility] [address], [place], [place] [address]    Culture   Final    NO GROWTH 5 DAYS Performed at [facility], [place] [address], [place], [place] [address]    Report Status 04/07/2017 FINAL  Final  Culture, sputum-assessment     Status: None   Collection Time: 04/03/17  2:45 AM  Result Value Ref Range Status   Specimen Description SPUTUM  Final   Special Requests NONE  Final   Sputum evaluation   Final    THIS SPECIMEN IS ACCEPTABLE FOR SPUTUM CULTURE Performed at [facility], [place] [address] [place], [place] [address]    Report Status 04/03/2017 FINAL   Final  Culture, respiratory (NON-Expectorated)     Status: None   Collection Time: 04/03/17  2:45 AM  Result Value Ref Range Status   Specimen Description   Final    SPUTUM Performed at [facility] [address] [place], [place] [address]    Special Requests   Final    NONE Reflexed from [id number] Performed at [facility], [place] [address], [place], [place] [address]    Gram Stain   Final    FEW WBC PRESENT, PREDOMINANTLY MONONUCLEAR FEW SQUAMOUS EPITHELIAL CELLS PRESENT FEW YEAST FEW GRAM NEGATIVE RODS FEW GRAM POSITIVE COCCI IN CLUSTERS FEW GRAM POSITIVE RODS    Culture   Final    Consistent with normal respiratory flora. Performed at [facility], [place] [address] [place], [place] [address]    Report Status 04/05/2017 FINAL  Final     Studies: No results found.  Scheduled Meds: . amoxicillin-clavulanate  1 tablet Oral Q12H  . enoxaparin (LOVENOX) injection  40 mg Subcutaneous Q24H  . famotidine  20 mg Oral Daily  . glycopyrrolate  1 mg Oral BID  . guaiFENesin  600 mg Oral BID  . insulin aspart  0-9 Units Subcutaneous TID WC  . ipratropium  0.5 mg Nebulization Q6H  . levalbuterol  1.25 mg Nebulization Q6H  . mometasone-formoterol  2 puff Inhalation BID    Continuous Infusions:    Time spent: 74mns, more than 50% time spent on coordination of care. I have personally reviewed and interpreted on  04/09/2017 daily labs, tele strips, imagings as discussed above under date review session and assessment and plans.  I reviewed all nursing notes, vitals, pertinent old records  I have discussed plan of care as described above with RN , patient and daugher on 04/09/2017   FFlorencia ReasonsMD, PhD  [facility] Pager [phone] If 7PM-7AM, please contact night-coverage at [email or web address], password [email or web address] Surgical Suites [email or web address]/25/2019, 2:57 PM  LOS: 7 days

## 2017-04-09 NOTE — Care Management Note (Deleted)
Case Management Note  Patient Details  Name: Michelle Mccall MRN: [id number] Date of Birth: 05-25-1940  Subjective/Objective: [name] chosen for HHPT-rep Santiago Glad aware of d/c today. No further CM needs.                   Action/Plan:d/c home w/HHPT   Expected Discharge Date:  04/03/17               Expected Discharge Plan:  [place]  In-House Referral:     Discharge planning Services  CM Consult  Post Acute Care Choice:    Choice offered to:  Patient  DME Arranged:    DME Agency:     HH Arranged:  PT [place]:  [place]  Status of Service:  Completed, signed off  If discussed at [place] of Stay Meetings, dates discussed:    Additional Comments:  Dessa Phi, RN 04/09/2017, 11:40 AM

## 2017-04-10 LAB — CBC WITH DIFFERENTIAL/PLATELET
BASOS PCT: 0 %
Basophils Absolute: 0 10*3/uL (ref 0.0–0.1)
EOS ABS: 0 10*3/uL (ref 0.0–0.7)
EOS PCT: 0 %
HCT: 43.6 % (ref 36.0–46.0)
Hemoglobin: 14.9 g/dL (ref 12.0–15.0)
LYMPHS ABS: 2.7 10*3/uL (ref 0.7–4.0)
Lymphocytes Relative: 24 %
MCH: 29.5 pg (ref 26.0–34.0)
MCHC: 34.2 g/dL (ref 30.0–36.0)
MCV: 86.3 fL (ref 78.0–100.0)
MONO ABS: 0.9 10*3/uL (ref 0.1–1.0)
MONOS PCT: 8 %
Neutro Abs: 7.6 10*3/uL (ref 1.7–7.7)
Neutrophils Relative %: 68 %
PLATELETS: 413 10*3/uL — AB (ref 150–400)
RBC: 5.05 MIL/uL (ref 3.87–5.11)
RDW: 14 % (ref 11.5–15.5)
WBC: 11.1 10*3/uL — ABNORMAL HIGH (ref 4.0–10.5)

## 2017-04-10 LAB — BASIC METABOLIC PANEL
Anion gap: 15 (ref 5–15)
BUN: 61 mg/dL — AB (ref 6–20)
CALCIUM: 9.9 mg/dL (ref 8.9–10.3)
CHLORIDE: 89 mmol/L — AB (ref 101–111)
CO2: 23 mmol/L (ref 22–32)
CREATININE: 0.9 mg/dL (ref 0.44–1.00)
GFR calc Af Amer: >60 mL/min (ref >60)
Glucose, Bld: 124 mg/dL — ABNORMAL HIGH (ref 65–99)
Potassium: 4.7 mmol/L (ref 3.5–5.1)
SODIUM: 127 mmol/L — AB (ref 135–145)

## 2017-04-10 LAB — PROTIME-INR
INR: 0.92
Prothrombin Time: 12.3 seconds (ref 11.4–15.2)

## 2017-04-10 LAB — MAGNESIUM: Magnesium: 2.2 mg/dL (ref 1.7–2.4)

## 2017-04-10 LAB — GLUCOSE, CAPILLARY
GLUCOSE-CAPILLARY: 142 mg/dL — AB (ref 65–99)
Glucose-Capillary: 141 mg/dL — ABNORMAL HIGH (ref 65–99)

## 2017-04-10 MED ORDER — SENNOSIDES-DOCUSATE SODIUM 8.6-50 MG PO TABS: 1.0000 | ORAL_TABLET | Freq: Two times a day (BID) | ORAL | 0 refills | Status: AC

## 2017-04-10 MED ORDER — ACETAMINOPHEN 325 MG PO TABS: 650.0000 mg | ORAL_TABLET | Freq: Four times a day (QID) | ORAL | 0 refills | Status: AC | PRN

## 2017-04-10 MED ORDER — DILTIAZEM HCL 30 MG PO TABS: 30.0000 mg | ORAL_TABLET | Freq: Two times a day (BID) | ORAL | 0 refills | Status: AC | PRN

## 2017-04-10 MED ORDER — GUAIFENESIN ER 600 MG PO TB12: 600.0000 mg | ORAL_TABLET | Freq: Two times a day (BID) | ORAL | 0 refills | Status: AC

## 2017-04-10 MED ORDER — LIDOCAINE 5 % EX PTCH: 1.0000 | MEDICATED_PATCH | CUTANEOUS | 0 refills | Status: AC

## 2017-04-10 MED ORDER — GLYCOPYRROLATE 1 MG PO TABS: 1.0000 mg | ORAL_TABLET | Freq: Two times a day (BID) | ORAL | 0 refills | Status: AC

## 2017-04-10 MED ORDER — LEVALBUTEROL HCL 1.25 MG/0.5ML IN NEBU: 1.2500 mg | INHALATION_SOLUTION | Freq: Three times a day (TID) | RESPIRATORY_TRACT | Status: DC

## 2017-04-10 MED ORDER — IPRATROPIUM BROMIDE 0.02 % IN SOLN: 0.5000 mg | Freq: Four times a day (QID) | RESPIRATORY_TRACT | 0 refills | Status: AC | PRN

## 2017-04-10 MED ORDER — IPRATROPIUM BROMIDE 0.02 % IN SOLN: 0.5000 mg | Freq: Three times a day (TID) | RESPIRATORY_TRACT | Status: DC

## 2017-04-10 NOTE — Progress Notes (Signed)
CSW acknowledged consult for home hospice referral. CSW notified patient's RNCM about home hospice referral. CSW signing off, no other needs identified at this time.  Abundio Miu, [place] Social Worker [facility] Cell#: [phone]

## 2017-04-10 NOTE — Care Management Note (Signed)
Case Management Note  Patient Details  Name: Michelle Mccall MRN: [id number] Date of Birth: [birth year]  Subjective/Objective: Spoke to dtr(speaks English) in rm about recc-home hospice-dtr states "she does not want this service because its for people who are going to die", she has friends to help her @ home,she doesn't want even HHC services-she doesn't want anyone in her house-she will manage on her own.Dtr did accept dme-suction machine, neb machine,& home 02 travel tank. [name] rep Santiago Glad have provided dme in rm-suction,neb machine,home 02 travel tank. Dtr will transport home on own. No further CM needs.                  Action/Plan:d/c home w/dme.   Expected Discharge Date:  04/10/17               Expected Discharge Plan:  Home/Self Care  In-House Referral:  Clinical Social Work  Discharge planning Services  CM Consult  Post Acute Care Choice:  Durable Medical Equipment(home 02 w/[name]) Choice offered to:  Adult Children  DME Arranged:  Nebulizer machine, Suction DME Agency:  [place]:    [place] Agency:     Status of Service:  Completed, signed off  If discussed at [place] of Stay Meetings, dates discussed:    Additional Comments:  Dessa Phi, RN 04/10/2017, 4:34 PM

## 2017-04-10 NOTE — Care Management Note (Signed)
Case Management Note  Patient Details  Name: Michelle Mccall MRN: [id number] Date of Birth: 03-Jun-1940  Subjective/Objective:CM referral for home hospice choice-TC dtr [place] [phone]-TDD states she does not want Home hospice services, & she has all the needed equipment-home 02,neb machine suction machine,she is busy right now to talk even though it is between the time frame 11-11:30a that she requested to call her. MD notified. Will request [facility] referral-CSW notified.                    Action/Plan:d/c plan home.   Expected Discharge Date:  04/03/17               Expected Discharge Plan:  Home/Self Care  In-House Referral:  Clinical Social Work  Discharge planning Services  CM Consult  Post Acute Care Choice:    Choice offered to:     DME Arranged:    DME Agency:     HH Arranged:    HH Agency:     Status of Service:  Completed, signed off  If discussed at [employer], dates discussed:    Additional Comments:  Dessa Phi, RN 04/10/2017, 11:22 AM

## 2017-04-10 NOTE — Discharge Summary (Addendum)
Discharge Summary  Michelle Mccall YYT:[id number] DOB: Jul 23, 1940  PCP: Patient, No Pcp Per  Admit date: 04/02/2017 Discharge date: 04/10/2017  Time spent: >11mns, more than 50% time spent on coordination of care.  Recommendations for Outpatient Follow-up:  1. F/u with home hospice  Discharge Diagnoses:  Active Hospital Problems   Diagnosis Date Noted  . Acute on chronic respiratory failure with hypoxia ([facility] 04/02/2017  . Pressure injury of skin 04/07/2017  . Acute respiratory failure with hypoxia ([facility] 04/03/2017  . Malignant neoplasm of bronchus and lung ([facility]   . Multifocal pneumonia 04/02/2017  . Bilateral pulmonary infiltrates on chest x-ray 04/02/2017  . Sinus tachycardia 04/02/2017  . SIRS (systemic inflammatory response syndrome) ([facility] 04/02/2017    Resolved Hospital Problems  No resolved problems to display.    Discharge Condition: stable  Diet recommendation: regular diet  Filed Weights   04/02/17 1958 04/03/17 1000  Weight: 52.6 kg (116 lb) 46.3 kg (102 lb 1.2 oz)    History of present illness: (per admitting MD Dr GAlcario Drought Patient coming from: Home   Chief Complaint: Cough, SOB  HPI: Michelle Alviaris a 77y.o. female with medical history significant of chronic bronchitis, reported chronic a-fib.  [place].  [facility]  Apparently she has been having waxing and waning respiratory symptoms for several months. Has had several different courses of antibiotics. Note also mentions steroids,nebs and she is also on 3 L of supplemental oxygen at home. [facility] and was referred to the emergency room.   ED Course: Tachycardic from 100-140, sinus tach, apparently has been intermittent.  CXR shows B infiltrates suggestive of PNA.  Note from DMonia Pouchsuggests however that infiltrates have been  present on CXRs since 05/2016.  Lactates nl at 1.6.  WBC 12k.  No fever, RR 30.  Given rocephin and azithromycin in ED.     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   patient was in the hospital in 2018 for the same, she did not follow up with oncology.  per chart review patient underwent bronchoscopy in 09/2016 , Transbronchial biopsies were negative for malignancy but a BAL cytology was worrisome for adenocarcinoma.  BRONCHIAL BRUSHING, LLL "There are a few sheets of atypical cells with prominent nucleoli, raising concern for a well differentiated adenocarcinoma."     per [place] she was told there is no cancer and if she were to stay in the hospital for one more month, she would get a lot better     Acute on chronic respiratory failure with hypoxia (HCC)/Bilateral pulmonary infiltrates: --CT scan of the chest this hospitalization showed bilateral asymmetrical infiltrates,Influence of PCR negative. -echocardiogram lvef wnl, A trivial pericardial effusion was identified. -she was Started empirically on IV Rocephin and azithro due to concerning for pneumonia, she had a mild  leukocytosis with a left shift,but no fever,  blood culture no growth, sputum culture Consistent with normal respiratory flora. procalcitonin less than 0.1. Iv abx d/ced, she is started on oral augmentin. -case  discussed with pulmonology and oncology, cliniallly highly suspicious for BAC, Patient declined repeat Bronchoscopy. Patient and family do not want oncology to be involved, they request pulmonology and cardiology consult. --she developed worsening of hypoxia on 2/21/-22 night,  she received iv lasix, not much pleural effusion by ultrasound, planned diagnostic/therapeutic thoracentesis aborted. Her blood pressure is too low to receive lasix.  - Case discussed with critical care on 2/25. Critical care input appreciated, detail please see critical care note from 2/25 -family meeting held together with palliative care Dr Answer at bedside with patient and her daughter. Disease trajectory discussed. Her pulmonary condition is likely from [name]. Over all prognosis is poor. Patient and daughter wish to focus on comfort , code status changed to DNR, home with home hospice.  Home o2, suction device, nebulizer prescribed.   Sinus tachycardia:Likely reactive. Echo lvef wnl with mild pericardioeffusion on 2/19 She has no lower extremity edema. Clinically dry bp soft, on prn lopressor as tolerated. She is discharged on cardizem prn for tachycardia She is discharged to home with home hospice.  Reported history of SVT versus A. Fib Has been having sinus tachycardia since admission, blood pressure too low to tolerate Cardizem or Lopressor.  was on Coumadin, this was discontinued in 10/2016. I have discussed with  Patient and daughter that th risk of coumadin overweight the benefit in her condition. I donot recommend coumadin. She is discharged on cardizem prn for tachycardia She is discharged to home with home hospice.  Reported history of diabetes mellitus type 2: Not on meds at home , on sliding scale insulin in the hospital She did not require much sliding scale insulin in the hospital. She has very poor oral intake. She is discharged to home with home hospice.   hyponatremia: Component of SIADH?    Range from Sodium 127 to 130, no confusion Daughter also report patient is afraid of drinking water, clinically she is dehydrated. She is discharged to home with home hospice.  Body mass index is 20.62 kg/m.  Stage I sacral pressure ulcer  5cm x 4cm area of erythema at coccygeal area No skin breakdown Wound care input appreciated.    DVT prophylaxis while in the hospital:Lovenox Disposition Plan/Barrier to D/C:home with home hospice on 2/26   Code Status: DNR  Family Communication: patient in room,  daughter briefly showed up in the hospital on 2/20 evening, but states has to go Daughter over the phone this  6pm on 2/21,  Daughter in room on 2/22, 2/23, 2/24, 2/25 Daughter states she is available by phone from 11am to 11:30 , she can come to the hospital in the evening during weekdays.    Consultants:  Case discussed with pulmonology Dr Lake Bells on 2/19 and 2/20, case discussed with critical care on 2/23 due to increased oxygen requirement, case discussed with critical care on 2/ 25  Case discussed with oncology Dr Irene Limbo in person on 2/20  Case discussed with palliative care Doctor Rowe Pavy daily  Procedures:  Planned bronchoscopy cancelled due to patient's refusal  Diagnostic and therapeutic thoracentesis aborted  Antibiotics:  Zithro/rocephin from 2/18 to 2/22  augmentin from 2/22 to 2/26   Discharge Exam: BP 105/83 (BP Location: Left Arm)   Pulse (!) 103   Temp 97.6 F (36.4 C) (Oral)   Resp Marland Kitchen)  22   Ht _0  (1.499 m)   Wt 46.3 kg (102 lb 1.2 oz)   SpO2 95%   BMI 20.62 kg/m    General:  Frail, constant productive cough, thin, malnourished  Cardiovascular: sinus tachycardia  Respiratory:  bilateral rales , no wheezing today, diminished at basis  Abdomen: Soft/ND/NT, positive BS  Musculoskeletal: No Edema  Neuro: alert, oriented    Discharge Instructions You were cared for by a hospitalist during your hospital stay. If you have  any questions about your discharge medications or the care you received while you were in the hospital after you are discharged, you can call the unit and asked to speak with the hospitalist on call if the hospitalist that took care of you is not available. Once you are discharged, your primary care physician will handle any further medical issues. Please note that NO REFILLS for any discharge medications will be authorized once you are discharged, as it is imperative that you return to your primary care physician (or establish a relationship with a primary care physician if you do not have one) for your aftercare needs so that they can reassess your need for medications and monitor your lab values.  Discharge Instructions    Diet general   Complete by:  As directed    Increase activity slowly   Complete by:  As directed      Allergies as of 04/10/2017      Reactions   Prednisone       Medication List    STOP taking these medications   diltiazem 180 MG 24 hr capsule Commonly known as:  DILACOR XR Replaced by:  diltiazem 30 MG tablet   DILTIAZEM CD 180 MG 24 hr capsule Generic drug:  diltiazem   Fluticasone-Salmeterol 250-50 MCG/DOSE Aepb Commonly known as:  ADVAIR   levofloxacin 750 MG tablet Commonly known as:  LEVAQUIN   warfarin 5 MG tablet Commonly known as:  COUMADIN     TAKE these medications   acetaminophen 325 MG tablet Commonly known as:  TYLENOL Take 2 tablets (650 mg total) by mouth every 6 (six) hours as needed for moderate pain (headache).   albuterol 108 (90 Base) MCG/ACT inhaler Commonly known as:  PROVENTIL HFA;VENTOLIN HFA Inhale 2 puffs into the lungs every 4 (four) hours as needed for wheezing or shortness of breath.   aspirin 81 MG chewable tablet Chew by mouth daily.   benzonatate 100 MG capsule Commonly known as:  TESSALON Take 100 mg by mouth 3 (three) times daily as needed for cough.   budesonide-formoterol 160-4.5 MCG/ACT inhaler Commonly  known as:  SYMBICORT Inhale 2 puffs into the lungs 2 (two) times daily. What changed:  Another medication with the same name was removed. Continue taking this medication, and follow the directions you see here.   diltiazem 30 MG tablet Commonly known as:  CARDIZEM Take 1 tablet (30 mg total) by mouth 2 (two) times daily as needed for up to 10 days. For heart rate greater than 120. Replaces:  diltiazem 180 MG 24 hr capsule   furosemide 40 MG tablet Commonly known as:  LASIX Take 40 mg by mouth daily as needed (Swelling). What changed:  Another medication with the same name was removed. Continue taking this medication, and follow the directions you see here.   glycopyrrolate 1 MG tablet Commonly known as:  ROBINUL Take 1 tablet (1 mg total) by mouth 2 (two) times daily.   guaiFENesin 600 MG 12 hr tablet  Commonly known as:  MUCINEX Take 1 tablet (600 mg total) by mouth 2 (two) times daily.   ipratropium 0.02 % nebulizer solution Commonly known as:  ATROVENT Take 2.5 mLs (0.5 mg total) by nebulization every 6 (six) hours as needed for wheezing or shortness of breath.   levalbuterol 1.25 MG/0.5ML nebulizer solution Commonly known as:  XOPENEX Take 1.25 mg by nebulization every 6 (six) hours as needed for wheezing or shortness of breath. What changed:  Another medication with the same name was removed. Continue taking this medication, and follow the directions you see here.   lidocaine 5 % Commonly known as:  LIDODERM Place 1 patch onto the skin daily. Remove & Discard patch within 12 hours or as directed by MD   promethazine-dextromethorphan 6.25-15 MG/5ML syrup Commonly known as:  PROMETHAZINE-DM Take 5 mLs by mouth 4 (four) times daily as needed for cough.   ranitidine 150 MG tablet Commonly known as:  ZANTAC Take 150 mg by mouth 2 (two) times daily as needed for heartburn. What changed:  Another medication with the same name was removed. Continue taking this medication, and  follow the directions you see here.   senna-docusate 8.6-50 MG tablet Commonly known as:  Senokot-S Take 1 tablet by mouth 2 (two) times daily.            Durable Medical Equipment  (From admission, onward)        Start     Ordered   04/10/17 1223  DME Oxygen  Once    Question Answer Comment  Mode or (Route) Nasal cannula   Liters per Minute 5   Frequency Continuous (stationary and portable oxygen unit needed)   Oxygen conserving device Yes   Oxygen delivery system Gas      04/10/17 1222   04/10/17 1143  For home use only DME Suction  Once    Question:  Suction  Answer:  Oral   04/10/17 1142   04/10/17 1136  For home use only DME Nebulizer machine  Once    Question:  Patient needs a nebulizer to treat with the following condition  Answer:  Acute on chronic respiratory failure with hypoxia ([place])   04/10/17 1137     Allergies  Allergen Reactions  . Prednisone    [place] Follow up.   Why:  neb machine,home oxygen,suction machine. Contact information: [address] [place] [address] [phone]            The results of significant diagnostics from this hospitalization (including imaging, microbiology, ancillary and laboratory) are listed below for reference.    Significant Diagnostic Studies: Dg Chest 2 View  Result Date: 04/02/2017 CLINICAL DATA:  Cough EXAM: CHEST  2 VIEW COMPARISON:  None. FINDINGS: Normal heart size. Normal mediastinal contour. No pneumothorax. Severe patchy consolidation throughout the left lung and at the right lung base. Possible small bilateral pleural effusions. IMPRESSION: Severe patchy consolidation throughout the left greater than right lungs, most compatible with multilobar pneumonia. Possible small bilateral pleural effusions. Short-term follow-up chest imaging advised. Electronically Signed   By: Ilona Sorrel M.D.   On: 04/02/2017 19:30   Ct Chest Wo Contrast  Result  Date: 04/02/2017 CLINICAL DATA:  Productive cough and shortness of breath. Pneumonia, unresolved or complicated. EXAM: CT CHEST WITHOUT CONTRAST TECHNIQUE: Multidetector CT imaging of the chest was performed following the standard protocol without IV contrast. COMPARISON:  Chest radiograph earlier this day.  No  prior exams. FINDINGS: Cardiovascular: Thoracic aorta is normal in caliber with mild atherosclerosis. Dilatation of main pulmonary artery measuring 3.7 cm. Upper normal heart size. Minimal pericardial fluid. Mediastinum/Nodes: Calcified hilar and mediastinal nodes consistent prior granulomatous disease. No bulky noncalcified mediastinal lymph nodes, lack of IV contrast limits detailed assessment. The esophagus is decompressed. Lungs/Pleura: Left lung: Density within the left lower lobe bronchus causes occlusion at the origin. There is diffuse consolidation throughout the left lower lobe without significant volume loss with ground-glass, septal common consolidative opacities. Cystic spaces within the consolidation of the left lower lobe dependently, some of which appear branching and may reflect bronchiectasis. Ground-glass and multifocal consolidative opacities throughout the left upper lobe, with some septal thickening and questionable honeycombing. Possible subpleural fibrosis in the left upper lobe. Small left pleural effusion. Right lung: Ground-glass and consolidative opacities throughout the entire right lower lobe with septal thickening. Air bronchograms peripherally. Minimal cystic spaces in the right lower lobe. Similar findings are seen in the right middle lobe to a lesser extent. The right bronchi are patent. Calcified granuloma in the right upper lobe. Scattered small subpleural densities in the anterior right upper lobe. Small right pleural effusion. Upper Abdomen: Unremarkable noncontrast appearance of the upper abdomen. Musculoskeletal: There are no acute or suspicious osseous abnormalities.  Rib deformities with thinning of the left lateral eighth and ninth ribs. IMPRESSION: 1. Extensive bilateral ground-glass, patchy and consolidative opacities throughout both lungs, left greater than right. Disease distribution greatest in the lung bases and left upper lobe. Cystic lucencies in the lower lobes (mostly on the left) appear branching and may be bronchiectasis, however cavitary process could have this appearance, but is felt less likely. Differential diagnosis is broad and includes infectious, inflammatory, or neoplastic processes. There are no prior exams to evaluate for acuity. 2. Short segment left lower lobe bronchial occlusion. Recommend bronchoscopy for further evaluation to evaluate for obstructing mass or mucous plug. 3. Question degree of underlying interstitial lung disease with septal thickening and subpleural reticulation. 4. Prior granulomatous disease with calcified right upper lobe nodule and calcified mediastinal adenopathy. 5. Dilatation of the main pulmonary artery suggests pulmonary arterial hypertension. 6. Rib deformities of the left lateral eighth and ninth ribs may be postsurgical, recommend correlation with surgical history. If there is history of left lung surgery, prior surgical pathology may aid in narrowing the differential. Electronically Signed   By: Jeb Levering M.D.   On: 04/02/2017 22:29   [place] Chest (pleural Effusion)  Result Date: 04/07/2017 CLINICAL DATA:  Admitted with concern for multifocal pneumonia. Please perform chest ultrasound and thoracentesis as indicated. EXAM: CHEST ULTRASOUND COMPARISON:  04/06/2017; 04/02/2017; chest CT-04/02/2017 FINDINGS: Sonographic evaluation demonstrates a small left-sided pleural effusion however given patient's vigorous coughing, the effusion is felt to be too small to allow for safe ultrasound-guided thoracentesis. This was discussed with referring clinician, Dr. Erlinda Hong, and the decision was made not to proceed with  thoracentesis. IMPRESSION: Small left-sided pleural effusion, too small to allow for safe ultrasound-guided thoracentesis given patient's current clinical state. Electronically Signed   By: Sandi Mariscal M.D.   On: 04/07/2017 11:09   Dg Chest Port 1 View  Result Date: 04/06/2017 CLINICAL DATA:  Increasing shortness of breath. EXAM: PORTABLE CHEST 1 VIEW COMPARISON:  Chest radiograph April 05, 2017 FINDINGS: Diffuse interstitial prominence most conspicuous in mid and lower lung zones with alveolar airspace opacities. At least moderate pleural effusions. Relative obscuration of the cardiac silhouette. Mediastinal silhouette is non suspicious. No pneumothorax. Soft tissue  planes and included osseous structures are unchanged. IMPRESSION: Stable interstitial and alveolar airspace opacities favoring pulmonary edema though there may be a component of pneumonia. Stable probable moderate pleural effusions. Electronically Signed   By: Elon Alas M.D.   On: 04/06/2017 04:50   Dg Chest Port 1 View  Result Date: 04/05/2017 CLINICAL DATA:  Pulmonary edema.  Increasing shortness of breath. EXAM: PORTABLE CHEST 1 VIEW COMPARISON:  Radiographs and CT 04/02/2017 FINDINGS: Persistent bilateral mid and lower lung zone opacities, slight worsening on the right from prior. Left-sided opacities are grossly similar. Bilateral pleural effusions. Cystic density at the left lung base is not as well seen. No pneumothorax. Mediastinal contours which early obscured due to pulmonary opacities. IMPRESSION: Persistent bilateral mid and lower lung zone opacities with slight worsening on the right from prior exam. Pleural effusions appear similar. Electronically Signed   By: Jeb Levering M.D.   On: 04/05/2017 00:38    Microbiology: Recent Results (from the past 240 hour(s))  Culture, blood (Routine x 2)     Status: None   Collection Time: 04/02/17  6:38 PM  Result Value Ref Range Status   Specimen Description BLOOD LEFT  WRIST  Final   Special Requests   Final    BOTTLES DRAWN AEROBIC AND ANAEROBIC Blood Culture adequate volume Performed at [facility] [address] [place], [place] [address]    Culture   Final    NO GROWTH 5 DAYS Performed at [facility], [place] [address] [place], [place] [address]    Report Status 04/07/2017 FINAL  Final  Culture, sputum-assessment     Status: None   Collection Time: 04/03/17  2:45 AM  Result Value Ref Range Status   Specimen Description SPUTUM  Final   Special Requests NONE  Final   Sputum evaluation   Final    THIS SPECIMEN IS ACCEPTABLE FOR SPUTUM CULTURE Performed at [facility], [place] [address] [place], [place] [address]    Report Status 04/03/2017 FINAL  Final  Culture, respiratory (NON-Expectorated)     Status: None   Collection Time: 04/03/17  2:45 AM  Result Value Ref Range Status   Specimen Description   Final    SPUTUM Performed at [facility] [address] [place], [place] [address]    Special Requests   Final    NONE Reflexed from [id number] Performed at [facility], [place] [address] [place], [place] [address]    Gram Stain   Final    FEW WBC PRESENT, PREDOMINANTLY MONONUCLEAR FEW SQUAMOUS EPITHELIAL CELLS PRESENT FEW YEAST FEW GRAM NEGATIVE RODS FEW GRAM POSITIVE COCCI IN CLUSTERS FEW GRAM POSITIVE RODS    Culture   Final    Consistent with normal respiratory flora. Performed at [facility], [place] [address], [place],  [address]    Report Status 04/05/2017 FINAL  Final     Labs: Basic Metabolic Panel: Recent Labs  Lab 04/06/17 0459 04/10/17 0829  NA 130* 127*  K 3.6 4.7  CL 91* 89*  CO2 27 23  GLUCOSE 138* 124*  BUN 14 61*  CREATININE 0.51 0.90  CALCIUM 9.2 9.9  MG 1.7 2.2   Liver Function Tests: No results for input(s): AST, ALT, ALKPHOS, BILITOT, PROT, ALBUMIN in the last 168 hours. No results for input(s): LIPASE,  AMYLASE in the last 168 hours. No results for input(s): AMMONIA in the last 168 hours. CBC: Recent Labs  Lab 04/06/17  0459 04/10/17 0829  WBC 11.8* 11.1*  NEUTROABS 8.7* 7.6  HGB 13.4 14.9  HCT 41.0 43.6  MCV 90.5 86.3  PLT 416* 413*   Cardiac Enzymes: No results for input(s): CKTOTAL, CKMB, CKMBINDEX, TROPONINI in the last 168 hours. BNP: BNP (last 3 results) Recent Labs    09/27/16 1900 04/02/17 1853  BNP 265.9* 674.2*    ProBNP (last 3 results) No results for input(s): PROBNP in the last 8760 hours.  CBG: Recent Labs  Lab 04/09/17 1147 04/09/17 1734 04/09/17 2146 04/10/17 0728 04/10/17 1143  GLUCAP 144* 133* 149* 141* 142*       Signed:  Florencia Reasons MD, PhD  [facility] 04/10/2017, 12:34 PM

## 2017-04-10 NOTE — Progress Notes (Signed)
CSW informed by patient's RNCM that patient needs an APS report made because patient's daughter is declining home hospice services.  CSW spoke with patient's attending MD who reported that patient has a lot of care needs including oxygen that will need to be arranged for patient's home. Patient's RNCM reported that patient's daughter reported that she does not need any services set up at the hospital and that she has everything she needs at home.  Per chart review, patient's RNCM has ordered equipment for patient's home.   CSW attempted to follow up with patient's daughter Kym Groom [phone]) to learn more, no answer.   CSW will not make APS report at this time due to no identified APS concern.   CSW signing off, no other needs identified at this time.   Abundio Miu, [place] Social Worker [place] Cell#: [phone]

## 2017-04-10 NOTE — Care Management Note (Signed)
Case Management Note  Patient Details  Name: Zameria Vogl MRN: [id number] Date of Birth: [birth year]  Subjective/Objective: Per attending will order [facility] DME-neb machine,suction-[name] dme rep Santiago Glad will process & deliver to rm. Confirmed that patient is active w/[name] for home 02-they will deliver home 02 travel tank to rm prior d/c.CSW will follow for APS referral.                  Action/Plan:d/c home w/DME   Expected Discharge Date:  04/03/17               Expected Discharge Plan:  Home/Self Care  In-House Referral:  Clinical Social Work  Discharge planning Services  CM Consult  Post Acute Care Choice:  Durable Medical Equipment(home 02 w/[name]) Choice offered to:     DME Arranged:  Nebulizer machine, Suction DME Agency:  Portal:    [place] Agency:     Status of Service:  Completed, signed off  If discussed at [employer] of Stay Meetings, dates discussed:    Additional Comments:  Dessa Phi, RN 04/10/2017, 11:43 AM

## 2017-04-10 NOTE — Progress Notes (Signed)
Pt to be discharged to home with Daughter this afternoon. Daughter here to pick up Pt. RN offered to use translator to help with discharge instructions. Daughter refusing "I speak English and don't need that". Discharge instructions including medications and medication changes gone over twice with Daughter. Daughter verbalized understanding. AVS in discharge packet with Daughter at time of discharge. Discharged to home with via wheelchair and oxygen.

## 2017-04-11 LAB — HEMOGLOBIN A1C
HEMOGLOBIN A1C: 7.1 % — AB (ref 4.8–5.6)
Mean Plasma Glucose: 157 mg/dL

## 2017-05-14 DEATH — deceased

## 2018-09-16 IMAGING — DX DG CHEST 1V PORT
1 series · 1 of 1 positions shown · non-contrast
Comparison: 09/30/2016.

CLINICAL DATA: Pneumonia .

EXAM:
PORTABLE CHEST 1 VIEW

[chest ap]
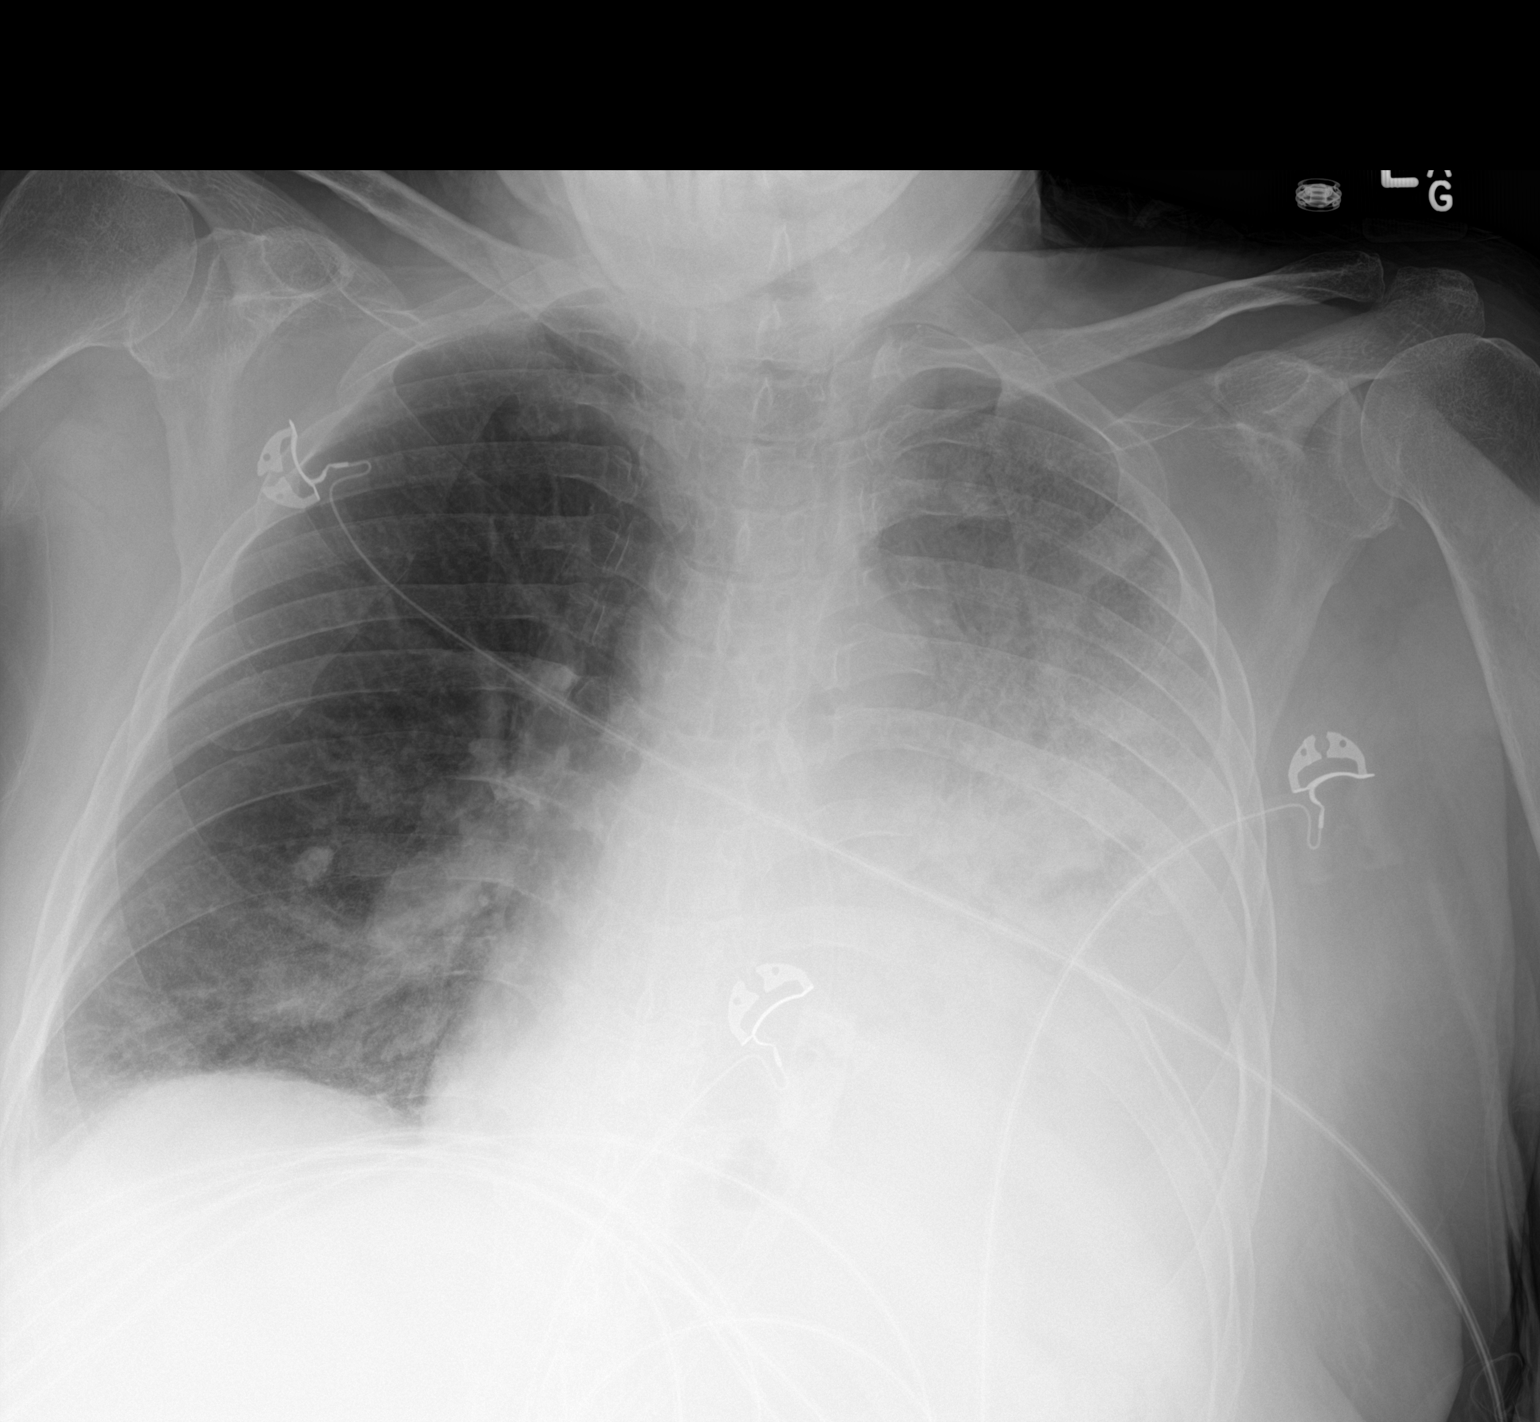

[1 of 1 positions shown; findings below may reference images not displayed]

FINDINGS: Diffuse left lung infiltrate is again noted without significant
interim change. Mild right base atelectasis. Calcified granuloma
noted right lung base. Left pleural effusion most likely present. No
pneumothorax. Cardiac contour is obscured by the diffuse left lung
infiltrate. No acute bony abnormality.
IMPRESSION: 1. Diffuse left lung infiltrate again noted consistent with
pneumonia. Associated left pleural effusion most likely present. No
interim change.

2.  Mild right base atelectasis.

## 2018-09-17 IMAGING — RF DG ESOPHAGUS
4 series · 16 of 16 positions shown · non-contrast
Comparison: None.

CLINICAL DATA: Coughing while drinking liquids. Evaluate for
aspiration.

EXAM:
ESOPHOGRAM/BARIUM SWALLOW
TECHNIQUE: Single contrast examination was performed using  thin barium.
FLUOROSCOPY TIME:  Fluoroscopy Time:  0.7 minutes
Radiation Exposure Index (if provided by the fluoroscopic device):
5.5 mGy.
Number of Acquired Spot Images: 0

[Series 1: cp_standard · 0.36mm/px · 4 of 56 frames shown (1 of 4)]
[frame 9/56]
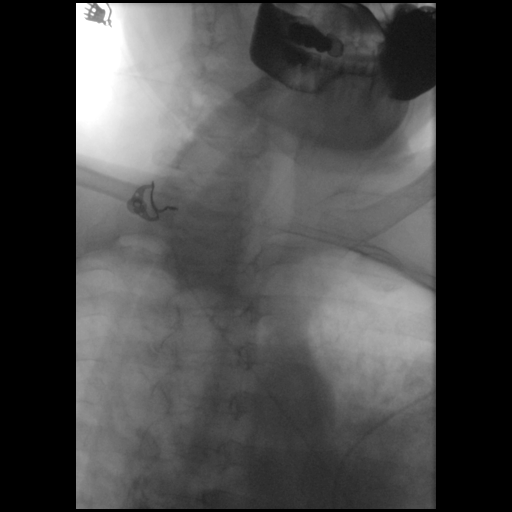
[frame 19/56]
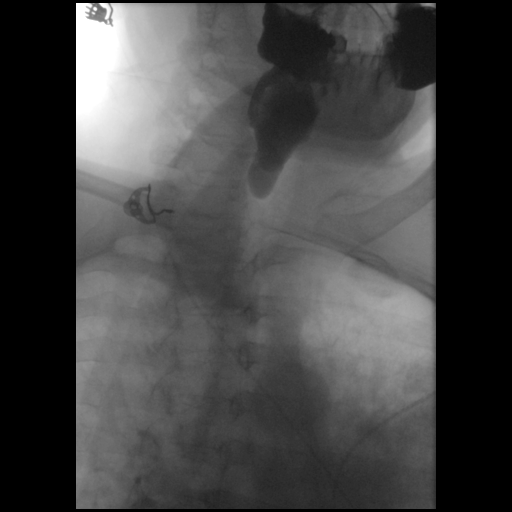
[frame 29/56]
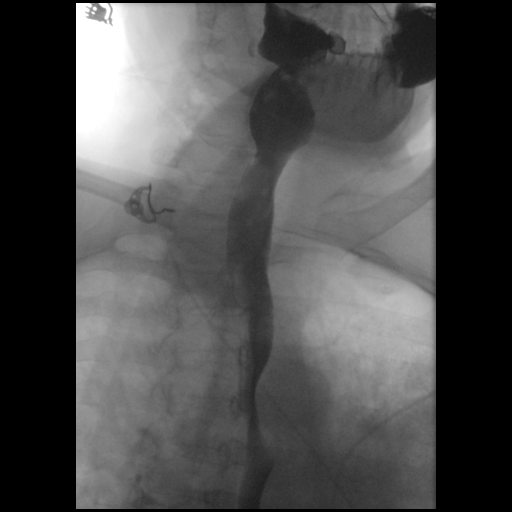
[frame 48/56]
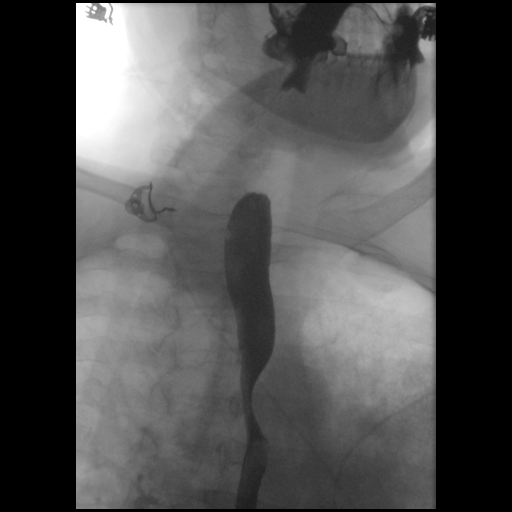

[Series 2: cp_standard · 0.38mm/px · 4 of 41 frames shown (2 of 4)]
[frame 7/41]
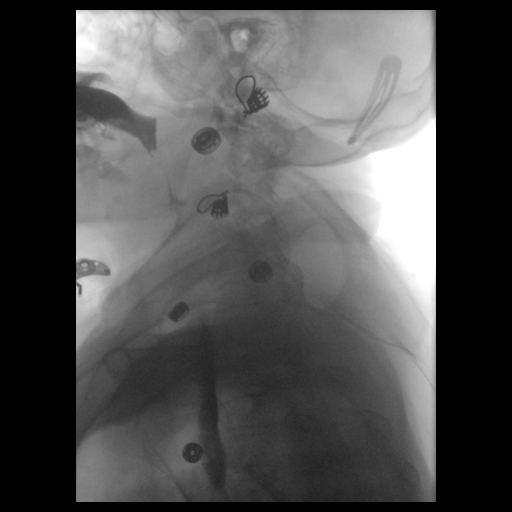
[frame 21/41]
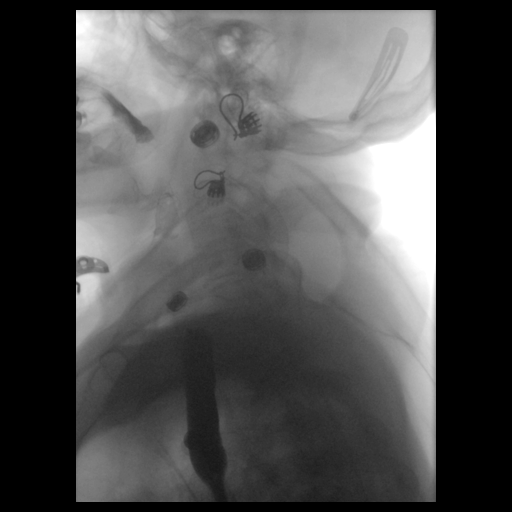
[frame 28/41]
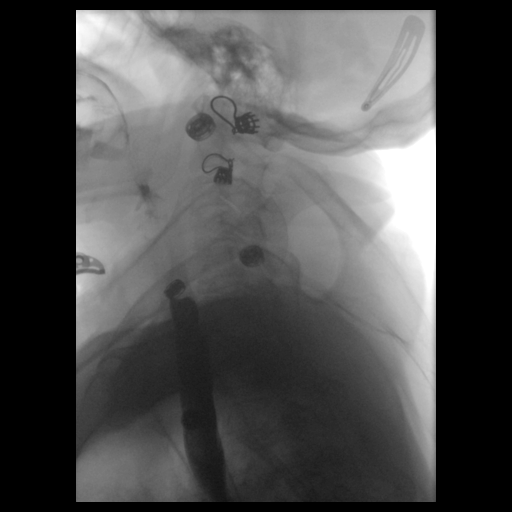
[frame 35/41]
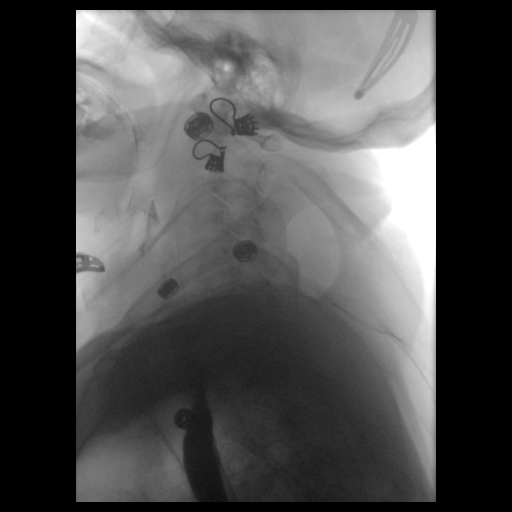

[Series 3: cp_standard · 0.38mm/px · 4 of 39 frames shown (3 of 4)]
[frame 6/39]
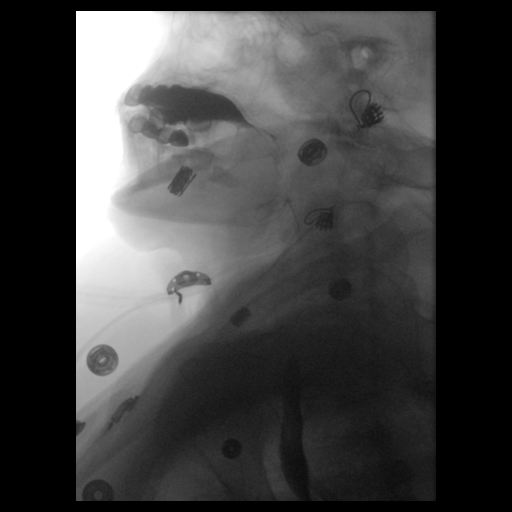
[frame 20/39]
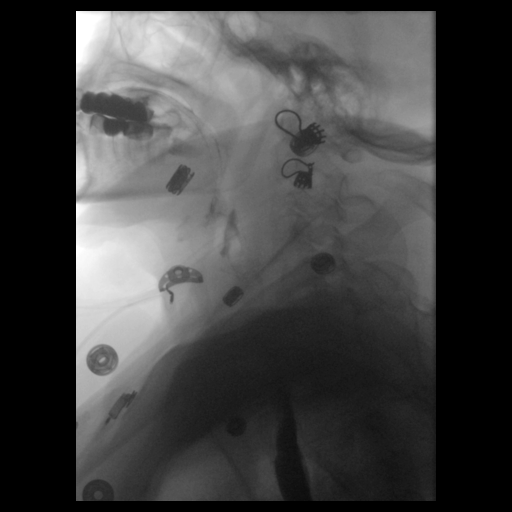
[frame 29/39]
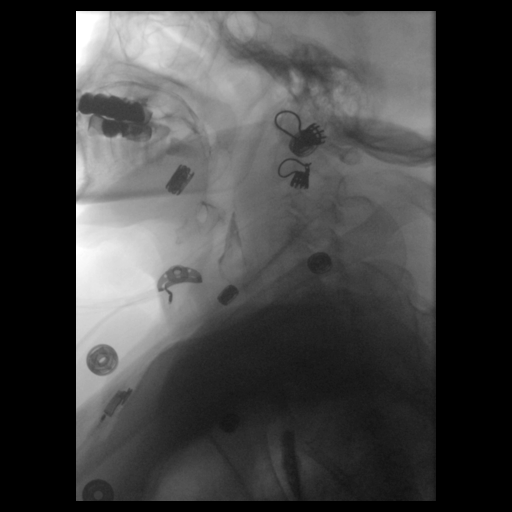
[frame 34/39]
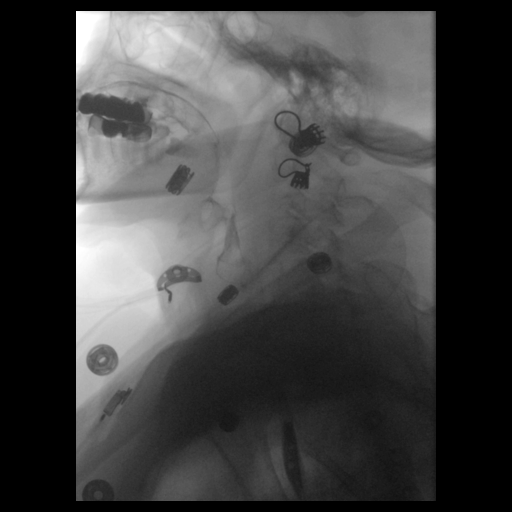

[Series 4: cp_standard · 0.38mm/px · 4 of 18 frames shown (4 of 4)]
[frame 3/18]
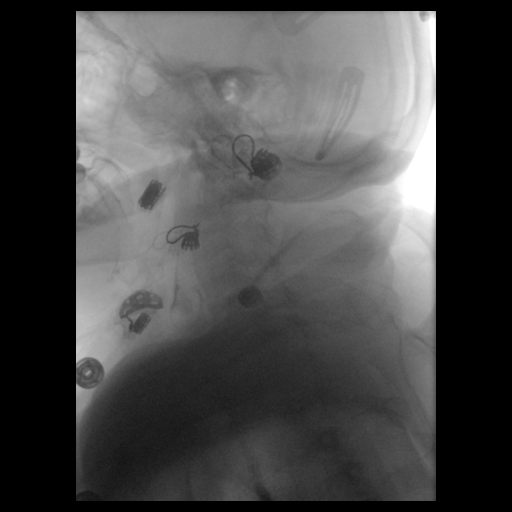
[frame 10/18]
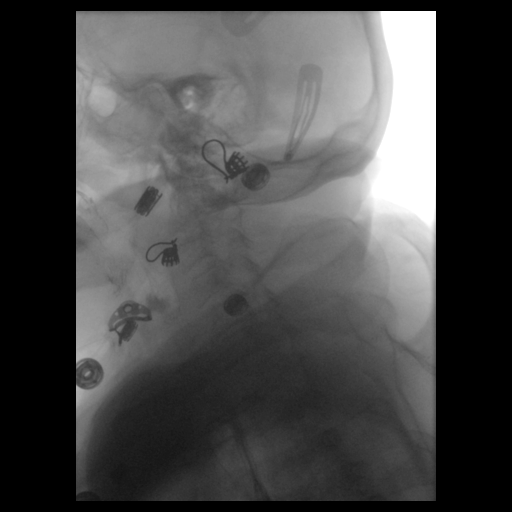
[frame 16/18]
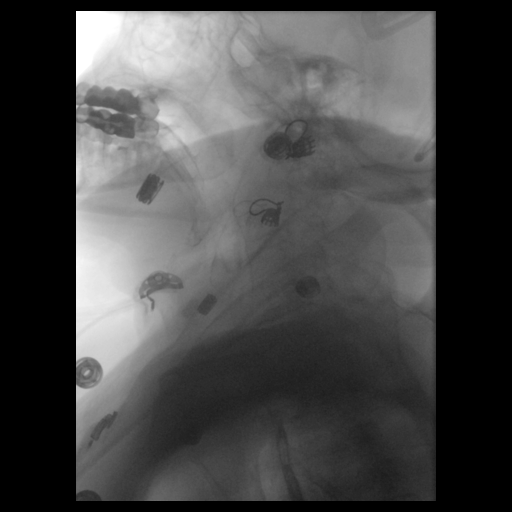
[frame 18/18]
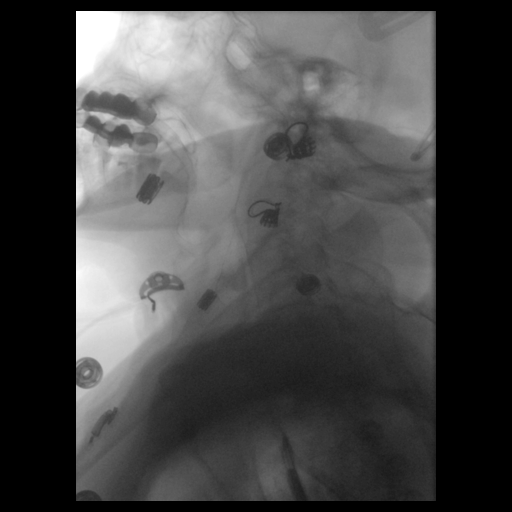

[16 of 16 positions shown; findings below may reference images not displayed]

FINDINGS: Right anterior oblique and right lateral decubitus views during
swallowing demonstrate no evidence of aspiration or significant
penetration.
IMPRESSION: No evidence of aspiration.
